# Patient Record
Sex: Female | Born: 1940 | Race: White | Hispanic: No | Smoking: Former smoker
Health system: Southern US, Community
[De-identification: ages and names within clinical notes are randomized; demographics above are authoritative.]

## PROBLEM LIST (undated history)

## (undated) DIAGNOSIS — F3289 Other specified depressive episodes: Secondary | ICD-10-CM

## (undated) DIAGNOSIS — I472 Ventricular tachycardia, unspecified: Secondary | ICD-10-CM

## (undated) DIAGNOSIS — E875 Hyperkalemia: Secondary | ICD-10-CM

## (undated) DIAGNOSIS — I4510 Unspecified right bundle-branch block: Secondary | ICD-10-CM

## (undated) DIAGNOSIS — Z8489 Family history of other specified conditions: Secondary | ICD-10-CM

## (undated) DIAGNOSIS — F329 Major depressive disorder, single episode, unspecified: Secondary | ICD-10-CM

## (undated) DIAGNOSIS — F411 Generalized anxiety disorder: Secondary | ICD-10-CM

## (undated) DIAGNOSIS — N183 Chronic kidney disease, stage 3 unspecified: Secondary | ICD-10-CM

## (undated) DIAGNOSIS — I2510 Atherosclerotic heart disease of native coronary artery without angina pectoris: Secondary | ICD-10-CM

## (undated) DIAGNOSIS — M1990 Unspecified osteoarthritis, unspecified site: Secondary | ICD-10-CM

## (undated) DIAGNOSIS — E854 Organ-limited amyloidosis: Secondary | ICD-10-CM

## (undated) DIAGNOSIS — I6523 Occlusion and stenosis of bilateral carotid arteries: Secondary | ICD-10-CM

## (undated) DIAGNOSIS — H348322 Tributary (branch) retinal vein occlusion, left eye, stable: Secondary | ICD-10-CM

## (undated) DIAGNOSIS — I429 Cardiomyopathy, unspecified: Secondary | ICD-10-CM

## (undated) DIAGNOSIS — N200 Calculus of kidney: Secondary | ICD-10-CM

## (undated) DIAGNOSIS — E739 Lactose intolerance, unspecified: Secondary | ICD-10-CM

## (undated) DIAGNOSIS — I959 Hypotension, unspecified: Secondary | ICD-10-CM

## (undated) DIAGNOSIS — E876 Hypokalemia: Secondary | ICD-10-CM

## (undated) DIAGNOSIS — I480 Paroxysmal atrial fibrillation: Secondary | ICD-10-CM

## (undated) DIAGNOSIS — I5040 Unspecified combined systolic (congestive) and diastolic (congestive) heart failure: Secondary | ICD-10-CM

## (undated) DIAGNOSIS — D649 Anemia, unspecified: Secondary | ICD-10-CM

## (undated) DIAGNOSIS — M4800 Spinal stenosis, site unspecified: Secondary | ICD-10-CM

## (undated) DIAGNOSIS — K219 Gastro-esophageal reflux disease without esophagitis: Secondary | ICD-10-CM

## (undated) DIAGNOSIS — G709 Myoneural disorder, unspecified: Secondary | ICD-10-CM

## (undated) DIAGNOSIS — E859 Amyloidosis, unspecified: Secondary | ICD-10-CM

## (undated) DIAGNOSIS — K589 Irritable bowel syndrome without diarrhea: Secondary | ICD-10-CM

## (undated) DIAGNOSIS — I43 Cardiomyopathy in diseases classified elsewhere: Secondary | ICD-10-CM

## (undated) DIAGNOSIS — I509 Heart failure, unspecified: Secondary | ICD-10-CM

## (undated) DIAGNOSIS — E785 Hyperlipidemia, unspecified: Secondary | ICD-10-CM

## (undated) HISTORY — PX: CARDIAC CATHETERIZATION: SHX172

## (undated) HISTORY — DX: Major depressive disorder, single episode, unspecified: F32.9

## (undated) HISTORY — PX: BREAST BIOPSY: SHX20

## (undated) HISTORY — DX: Calculus of kidney: N20.0

## (undated) HISTORY — DX: Hyperlipidemia, unspecified: E78.5

## (undated) HISTORY — DX: Irritable bowel syndrome, unspecified: K58.9

## (undated) HISTORY — PX: TONSILLECTOMY: SUR1361

## (undated) HISTORY — DX: Generalized anxiety disorder: F41.1

## (undated) HISTORY — DX: Amyloidosis, unspecified: E85.9

## (undated) HISTORY — DX: Other specified depressive episodes: F32.89

## (undated) HISTORY — DX: Spinal stenosis, site unspecified: M48.00

## (undated) HISTORY — DX: Tributary (branch) retinal vein occlusion, left eye, stable: H34.8322

## (undated) HISTORY — DX: Lactose intolerance, unspecified: E73.9

## (undated) HISTORY — DX: Atherosclerotic heart disease of native coronary artery without angina pectoris: I25.10

---

## 1959-08-25 HISTORY — PX: APPENDECTOMY: SHX54

## 1990-08-24 HISTORY — PX: BREAST BIOPSY: SHX20

## 1998-02-22 ENCOUNTER — Other Ambulatory Visit: Admission: RE | Admit: 1998-02-22 | Discharge: 1998-02-22 | Payer: Self-pay

## 2000-08-24 HISTORY — PX: EXCISIONAL HEMORRHOIDECTOMY: SHX1541

## 2002-04-05 ENCOUNTER — Encounter: Payer: Self-pay | Admitting: Internal Medicine

## 2002-05-19 ENCOUNTER — Encounter: Payer: Self-pay | Admitting: Internal Medicine

## 2003-08-25 HISTORY — PX: CARPAL TUNNEL RELEASE: SHX101

## 2005-04-10 ENCOUNTER — Encounter: Payer: Self-pay | Admitting: Internal Medicine

## 2005-10-16 ENCOUNTER — Ambulatory Visit: Payer: Self-pay | Admitting: Internal Medicine

## 2005-12-18 ENCOUNTER — Ambulatory Visit: Payer: Self-pay | Admitting: Internal Medicine

## 2006-05-31 ENCOUNTER — Ambulatory Visit: Payer: Self-pay | Admitting: Internal Medicine

## 2006-08-06 ENCOUNTER — Encounter: Admission: RE | Admit: 2006-08-06 | Discharge: 2006-11-04 | Payer: Self-pay | Admitting: Internal Medicine

## 2006-09-01 ENCOUNTER — Ambulatory Visit: Payer: Self-pay | Admitting: Internal Medicine

## 2006-10-05 ENCOUNTER — Encounter (INDEPENDENT_AMBULATORY_CARE_PROVIDER_SITE_OTHER): Payer: Self-pay | Admitting: Pulmonary Disease

## 2006-10-27 ENCOUNTER — Ambulatory Visit: Payer: Self-pay | Admitting: Internal Medicine

## 2006-10-28 ENCOUNTER — Ambulatory Visit: Payer: Self-pay | Admitting: Cardiology

## 2006-12-01 ENCOUNTER — Ambulatory Visit: Payer: Self-pay

## 2006-12-01 ENCOUNTER — Encounter: Payer: Self-pay | Admitting: Cardiology

## 2006-12-01 ENCOUNTER — Ambulatory Visit: Payer: Self-pay | Admitting: Cardiology

## 2007-01-06 ENCOUNTER — Encounter (INDEPENDENT_AMBULATORY_CARE_PROVIDER_SITE_OTHER): Payer: Self-pay | Admitting: Internal Medicine

## 2007-01-11 ENCOUNTER — Ambulatory Visit: Payer: Self-pay | Admitting: Cardiology

## 2007-01-18 ENCOUNTER — Encounter (HOSPITAL_COMMUNITY): Admission: RE | Admit: 2007-01-18 | Discharge: 2007-03-24 | Payer: Self-pay | Admitting: Cardiology

## 2007-02-21 ENCOUNTER — Ambulatory Visit (HOSPITAL_BASED_OUTPATIENT_CLINIC_OR_DEPARTMENT_OTHER): Admission: RE | Admit: 2007-02-21 | Discharge: 2007-02-21 | Payer: Self-pay | Admitting: Urology

## 2007-02-21 ENCOUNTER — Encounter (INDEPENDENT_AMBULATORY_CARE_PROVIDER_SITE_OTHER): Payer: Self-pay | Admitting: Urology

## 2007-02-21 HISTORY — PX: OTHER SURGICAL HISTORY: SHX169

## 2007-04-08 ENCOUNTER — Ambulatory Visit: Payer: Self-pay | Admitting: Cardiology

## 2007-08-02 ENCOUNTER — Ambulatory Visit: Payer: Self-pay | Admitting: Cardiology

## 2007-09-26 ENCOUNTER — Ambulatory Visit: Payer: Self-pay | Admitting: Cardiology

## 2007-12-21 ENCOUNTER — Ambulatory Visit: Payer: Self-pay | Admitting: Cardiology

## 2008-01-20 ENCOUNTER — Telehealth: Payer: Self-pay | Admitting: Internal Medicine

## 2008-02-16 ENCOUNTER — Ambulatory Visit: Payer: Self-pay | Admitting: Cardiology

## 2008-03-01 DIAGNOSIS — F411 Generalized anxiety disorder: Secondary | ICD-10-CM | POA: Insufficient documentation

## 2008-03-01 DIAGNOSIS — K589 Irritable bowel syndrome without diarrhea: Secondary | ICD-10-CM

## 2008-03-01 DIAGNOSIS — K648 Other hemorrhoids: Secondary | ICD-10-CM | POA: Insufficient documentation

## 2008-03-01 DIAGNOSIS — K5900 Constipation, unspecified: Secondary | ICD-10-CM | POA: Insufficient documentation

## 2008-03-01 DIAGNOSIS — I2510 Atherosclerotic heart disease of native coronary artery without angina pectoris: Secondary | ICD-10-CM | POA: Insufficient documentation

## 2008-03-01 DIAGNOSIS — E785 Hyperlipidemia, unspecified: Secondary | ICD-10-CM

## 2008-03-01 DIAGNOSIS — E739 Lactose intolerance, unspecified: Secondary | ICD-10-CM

## 2008-03-01 DIAGNOSIS — I10 Essential (primary) hypertension: Secondary | ICD-10-CM | POA: Insufficient documentation

## 2008-03-01 DIAGNOSIS — F329 Major depressive disorder, single episode, unspecified: Secondary | ICD-10-CM | POA: Insufficient documentation

## 2008-05-14 ENCOUNTER — Encounter: Payer: 59 | Admitting: Unknown Physician Specialty

## 2008-05-16 ENCOUNTER — Ambulatory Visit: Payer: Self-pay | Admitting: Cardiology

## 2008-05-24 ENCOUNTER — Encounter: Payer: 59 | Admitting: Unknown Physician Specialty

## 2008-06-07 ENCOUNTER — Encounter: Admission: RE | Admit: 2008-06-07 | Discharge: 2008-06-07 | Payer: Self-pay | Admitting: Neurology

## 2008-06-24 ENCOUNTER — Encounter: Payer: 59 | Admitting: Unknown Physician Specialty

## 2008-07-24 ENCOUNTER — Encounter: Payer: 59 | Admitting: Unknown Physician Specialty

## 2008-09-07 ENCOUNTER — Encounter: Payer: Self-pay | Admitting: Cardiology

## 2008-10-18 ENCOUNTER — Ambulatory Visit: Payer: Self-pay

## 2008-11-16 DIAGNOSIS — R609 Edema, unspecified: Secondary | ICD-10-CM

## 2008-11-19 ENCOUNTER — Encounter: Payer: Self-pay | Admitting: Cardiology

## 2008-11-19 ENCOUNTER — Ambulatory Visit: Payer: Self-pay | Admitting: Cardiology

## 2008-11-19 DIAGNOSIS — I491 Atrial premature depolarization: Secondary | ICD-10-CM

## 2008-11-20 ENCOUNTER — Emergency Department (HOSPITAL_COMMUNITY): Admission: EM | Admit: 2008-11-20 | Discharge: 2008-11-20 | Payer: Self-pay | Admitting: Emergency Medicine

## 2008-12-10 ENCOUNTER — Ambulatory Visit: Payer: Self-pay | Admitting: Cardiology

## 2008-12-10 ENCOUNTER — Encounter: Payer: Self-pay | Admitting: Cardiology

## 2008-12-10 ENCOUNTER — Ambulatory Visit: Payer: Self-pay

## 2008-12-10 LAB — CONVERTED CEMR LAB
ALT: 13 units/L (ref 0–35)
AST: 20 units/L (ref 0–37)
Albumin: 3.3 g/dL — ABNORMAL LOW (ref 3.5–5.2)
Alkaline Phosphatase: 31 units/L — ABNORMAL LOW (ref 39–117)
Bilirubin, Direct: 0.2 mg/dL (ref 0.0–0.3)
Cholesterol: 169 mg/dL (ref 0–200)
LDL Cholesterol: 93 mg/dL (ref 0–99)
Total Protein: 6.6 g/dL (ref 6.0–8.3)
Triglycerides: 53 mg/dL (ref 0.0–149.0)

## 2008-12-13 ENCOUNTER — Telehealth: Payer: Self-pay | Admitting: Cardiology

## 2008-12-21 ENCOUNTER — Telehealth: Payer: Self-pay | Admitting: Cardiology

## 2008-12-31 ENCOUNTER — Telehealth: Payer: Self-pay | Admitting: Cardiology

## 2009-01-04 ENCOUNTER — Telehealth: Payer: Self-pay | Admitting: Cardiology

## 2009-01-14 ENCOUNTER — Telehealth: Payer: Self-pay | Admitting: Internal Medicine

## 2009-01-16 ENCOUNTER — Ambulatory Visit: Payer: Self-pay | Admitting: Internal Medicine

## 2009-01-16 DIAGNOSIS — R141 Gas pain: Secondary | ICD-10-CM

## 2009-01-16 DIAGNOSIS — R142 Eructation: Secondary | ICD-10-CM

## 2009-01-16 DIAGNOSIS — R1013 Epigastric pain: Secondary | ICD-10-CM

## 2009-01-16 DIAGNOSIS — R143 Flatulence: Secondary | ICD-10-CM

## 2009-01-23 ENCOUNTER — Ambulatory Visit (HOSPITAL_COMMUNITY): Admission: RE | Admit: 2009-01-23 | Discharge: 2009-01-23 | Payer: Self-pay | Admitting: Internal Medicine

## 2009-01-30 ENCOUNTER — Ambulatory Visit: Payer: Self-pay

## 2009-01-30 ENCOUNTER — Ambulatory Visit: Payer: Self-pay | Admitting: Cardiology

## 2009-03-04 ENCOUNTER — Ambulatory Visit: Payer: Self-pay | Admitting: Cardiology

## 2009-03-06 ENCOUNTER — Telehealth: Payer: Self-pay | Admitting: Cardiology

## 2009-03-12 ENCOUNTER — Ambulatory Visit: Payer: Self-pay | Admitting: Internal Medicine

## 2009-05-08 ENCOUNTER — Ambulatory Visit: Payer: Self-pay | Admitting: Cardiology

## 2009-05-22 ENCOUNTER — Encounter: Payer: Self-pay | Admitting: Cardiology

## 2009-05-27 ENCOUNTER — Telehealth: Payer: Self-pay | Admitting: Cardiology

## 2009-06-06 ENCOUNTER — Encounter: Payer: Self-pay | Admitting: Cardiology

## 2009-06-24 ENCOUNTER — Ambulatory Visit: Payer: Self-pay | Admitting: Cardiology

## 2009-07-26 ENCOUNTER — Telehealth: Payer: Self-pay | Admitting: Cardiology

## 2009-08-12 ENCOUNTER — Telehealth (INDEPENDENT_AMBULATORY_CARE_PROVIDER_SITE_OTHER): Payer: Self-pay | Admitting: *Deleted

## 2009-08-12 ENCOUNTER — Telehealth: Payer: Self-pay | Admitting: Cardiology

## 2009-08-19 ENCOUNTER — Encounter: Payer: Self-pay | Admitting: Cardiology

## 2009-09-12 ENCOUNTER — Telehealth: Payer: Self-pay | Admitting: Cardiology

## 2009-09-13 ENCOUNTER — Encounter: Payer: Self-pay | Admitting: Cardiology

## 2009-09-25 ENCOUNTER — Encounter (INDEPENDENT_AMBULATORY_CARE_PROVIDER_SITE_OTHER): Payer: Self-pay | Admitting: Neurosurgery

## 2009-09-25 ENCOUNTER — Ambulatory Visit (HOSPITAL_COMMUNITY): Admission: RE | Admit: 2009-09-25 | Discharge: 2009-09-25 | Payer: Self-pay | Admitting: Neurosurgery

## 2009-09-25 HISTORY — PX: OTHER SURGICAL HISTORY: SHX169

## 2009-10-02 ENCOUNTER — Encounter: Payer: Self-pay | Admitting: Cardiology

## 2009-10-16 ENCOUNTER — Encounter: Payer: Self-pay | Admitting: Cardiology

## 2009-10-16 DIAGNOSIS — I6529 Occlusion and stenosis of unspecified carotid artery: Secondary | ICD-10-CM

## 2009-10-17 ENCOUNTER — Telehealth (INDEPENDENT_AMBULATORY_CARE_PROVIDER_SITE_OTHER): Payer: Self-pay | Admitting: *Deleted

## 2009-10-17 ENCOUNTER — Encounter: Payer: Self-pay | Admitting: Cardiology

## 2009-10-17 ENCOUNTER — Ambulatory Visit: Payer: Self-pay

## 2009-10-22 ENCOUNTER — Encounter: Payer: Self-pay | Admitting: Cardiology

## 2009-10-24 ENCOUNTER — Telehealth: Payer: Self-pay | Admitting: Internal Medicine

## 2009-11-08 ENCOUNTER — Telehealth: Payer: Self-pay | Admitting: Internal Medicine

## 2009-11-14 ENCOUNTER — Ambulatory Visit: Payer: Self-pay | Admitting: Internal Medicine

## 2009-11-18 ENCOUNTER — Encounter: Payer: Self-pay | Admitting: Cardiology

## 2009-12-18 ENCOUNTER — Telehealth: Payer: Self-pay | Admitting: Internal Medicine

## 2009-12-24 ENCOUNTER — Ambulatory Visit: Payer: Self-pay | Admitting: Cardiology

## 2009-12-24 DIAGNOSIS — R5383 Other fatigue: Secondary | ICD-10-CM

## 2009-12-24 DIAGNOSIS — R5381 Other malaise: Secondary | ICD-10-CM

## 2009-12-26 ENCOUNTER — Telehealth: Payer: Self-pay | Admitting: Internal Medicine

## 2010-01-13 ENCOUNTER — Encounter: Payer: Self-pay | Admitting: Cardiology

## 2010-01-13 ENCOUNTER — Ambulatory Visit: Payer: Self-pay

## 2010-01-13 ENCOUNTER — Ambulatory Visit: Payer: Self-pay | Admitting: Cardiology

## 2010-01-13 ENCOUNTER — Ambulatory Visit (HOSPITAL_COMMUNITY): Admission: RE | Admit: 2010-01-13 | Discharge: 2010-01-13 | Payer: Self-pay | Admitting: Cardiology

## 2010-01-13 ENCOUNTER — Encounter (INDEPENDENT_AMBULATORY_CARE_PROVIDER_SITE_OTHER): Payer: Self-pay | Admitting: *Deleted

## 2010-01-24 ENCOUNTER — Telehealth: Payer: Self-pay | Admitting: Internal Medicine

## 2010-01-28 ENCOUNTER — Ambulatory Visit: Payer: Self-pay | Admitting: Cardiology

## 2010-01-28 ENCOUNTER — Encounter: Payer: Self-pay | Admitting: Internal Medicine

## 2010-02-04 ENCOUNTER — Encounter: Payer: Self-pay | Admitting: Cardiology

## 2010-02-07 ENCOUNTER — Telehealth: Payer: Self-pay | Admitting: Internal Medicine

## 2010-02-11 ENCOUNTER — Ambulatory Visit: Payer: Self-pay | Admitting: Internal Medicine

## 2010-02-12 LAB — CONVERTED CEMR LAB
Basophils Absolute: 0 10*3/uL (ref 0.0–0.1)
Eosinophils Absolute: 0.1 10*3/uL (ref 0.0–0.7)
Lymphocytes Relative: 33.6 % (ref 12.0–46.0)
MCHC: 33.8 g/dL (ref 30.0–36.0)
Monocytes Relative: 5.7 % (ref 3.0–12.0)
Neutro Abs: 4.1 10*3/uL (ref 1.4–7.7)
Neutrophils Relative %: 59.7 % (ref 43.0–77.0)
Platelets: 245 10*3/uL (ref 150.0–400.0)
RDW: 17.7 % — ABNORMAL HIGH (ref 11.5–14.6)
Saturation Ratios: 6.9 % — ABNORMAL LOW (ref 20.0–50.0)
Transferrin: 260.1 mg/dL (ref 212.0–360.0)
Vitamin B-12: 493 pg/mL (ref 211–911)

## 2010-02-20 ENCOUNTER — Ambulatory Visit: Payer: Self-pay | Admitting: Internal Medicine

## 2010-02-21 LAB — CONVERTED CEMR LAB
Fecal Occult Blood: NEGATIVE
OCCULT 2: NEGATIVE
OCCULT 5: NEGATIVE

## 2010-03-04 ENCOUNTER — Telehealth: Payer: Self-pay | Admitting: Cardiology

## 2010-03-10 ENCOUNTER — Telehealth: Payer: Self-pay | Admitting: Cardiology

## 2010-03-27 ENCOUNTER — Encounter: Payer: Self-pay | Admitting: Cardiology

## 2010-04-04 ENCOUNTER — Ambulatory Visit: Payer: Self-pay | Admitting: Cardiology

## 2010-04-07 ENCOUNTER — Telehealth: Payer: Self-pay | Admitting: Cardiology

## 2010-04-10 ENCOUNTER — Telehealth: Payer: Self-pay | Admitting: Internal Medicine

## 2010-04-11 ENCOUNTER — Telehealth: Payer: Self-pay | Admitting: Internal Medicine

## 2010-04-22 ENCOUNTER — Encounter: Payer: Self-pay | Admitting: Cardiology

## 2010-04-25 ENCOUNTER — Telehealth: Payer: Self-pay | Admitting: Cardiology

## 2010-05-02 ENCOUNTER — Ambulatory Visit: Payer: Self-pay | Admitting: Cardiology

## 2010-05-02 ENCOUNTER — Telehealth: Payer: Self-pay | Admitting: Cardiology

## 2010-05-02 DIAGNOSIS — I498 Other specified cardiac arrhythmias: Secondary | ICD-10-CM | POA: Insufficient documentation

## 2010-05-07 ENCOUNTER — Encounter: Payer: Self-pay | Admitting: Internal Medicine

## 2010-05-08 ENCOUNTER — Telehealth: Payer: Self-pay | Admitting: Cardiology

## 2010-05-12 ENCOUNTER — Ambulatory Visit: Payer: Self-pay | Admitting: Cardiovascular Disease

## 2010-05-12 LAB — CONVERTED CEMR LAB
INR: 1.2
POC INR: 1.2

## 2010-05-16 ENCOUNTER — Ambulatory Visit: Payer: Self-pay | Admitting: Cardiology

## 2010-05-16 LAB — CONVERTED CEMR LAB: POC INR: 1.8

## 2010-05-21 ENCOUNTER — Ambulatory Visit: Payer: Self-pay | Admitting: Cardiology

## 2010-05-30 ENCOUNTER — Ambulatory Visit: Payer: Self-pay | Admitting: Cardiology

## 2010-05-30 LAB — CONVERTED CEMR LAB: POC INR: 3.9

## 2010-06-05 ENCOUNTER — Telehealth: Payer: Self-pay | Admitting: Cardiology

## 2010-06-05 ENCOUNTER — Ambulatory Visit: Payer: Self-pay | Admitting: Cardiology

## 2010-06-09 ENCOUNTER — Telehealth: Payer: Self-pay | Admitting: Cardiology

## 2010-06-09 ENCOUNTER — Ambulatory Visit: Payer: Self-pay | Admitting: Cardiology

## 2010-06-10 LAB — CONVERTED CEMR LAB
BUN: 19 mg/dL (ref 6–23)
CO2: 32 meq/L (ref 19–32)
Calcium: 8.6 mg/dL (ref 8.4–10.5)
Chloride: 101 meq/L (ref 96–112)
Creatinine, Ser: 0.6 mg/dL (ref 0.4–1.2)

## 2010-06-13 ENCOUNTER — Telehealth: Payer: Self-pay | Admitting: Cardiology

## 2010-06-19 ENCOUNTER — Encounter: Admission: RE | Admit: 2010-06-19 | Discharge: 2010-06-19 | Payer: Self-pay

## 2010-06-23 ENCOUNTER — Ambulatory Visit: Payer: Self-pay | Admitting: Cardiology

## 2010-07-04 ENCOUNTER — Encounter: Payer: Self-pay | Admitting: Cardiology

## 2010-07-07 ENCOUNTER — Ambulatory Visit: Payer: Self-pay | Admitting: Internal Medicine

## 2010-07-07 ENCOUNTER — Telehealth: Payer: Self-pay | Admitting: Cardiology

## 2010-07-22 ENCOUNTER — Telehealth: Payer: Self-pay | Admitting: Cardiology

## 2010-07-26 ENCOUNTER — Telehealth: Payer: Self-pay | Admitting: Physician Assistant

## 2010-07-29 ENCOUNTER — Telehealth: Payer: Self-pay | Admitting: Cardiology

## 2010-07-29 ENCOUNTER — Encounter: Payer: Self-pay | Admitting: Cardiology

## 2010-07-30 ENCOUNTER — Encounter: Payer: Self-pay | Admitting: Cardiology

## 2010-08-08 ENCOUNTER — Ambulatory Visit: Payer: Self-pay | Admitting: Cardiology

## 2010-08-12 ENCOUNTER — Encounter: Payer: Self-pay | Admitting: Cardiology

## 2010-08-12 LAB — CONVERTED CEMR LAB
POC INR: 2.73
Prothrombin Time: 29.8 s

## 2010-09-01 ENCOUNTER — Telehealth: Payer: Self-pay | Admitting: Cardiology

## 2010-09-02 ENCOUNTER — Other Ambulatory Visit: Payer: Self-pay

## 2010-09-02 ENCOUNTER — Ambulatory Visit: Admission: RE | Admit: 2010-09-02 | Discharge: 2010-09-02 | Payer: Self-pay | Source: Home / Self Care

## 2010-09-02 LAB — BASIC METABOLIC PANEL
BUN: 26 mg/dL — ABNORMAL HIGH (ref 6–23)
CO2: 31 mEq/L (ref 19–32)
Calcium: 8.6 mg/dL (ref 8.4–10.5)
Chloride: 104 mEq/L (ref 96–112)
Creatinine, Ser: 0.9 mg/dL (ref 0.4–1.2)
GFR: 67.32 mL/min (ref >60.00)
Glucose, Bld: 80 mg/dL (ref 70–99)
Potassium: 4.3 mEq/L (ref 3.5–5.1)
Sodium: 142 mEq/L (ref 135–145)

## 2010-09-02 LAB — CBC WITH DIFFERENTIAL/PLATELET
Basophils Absolute: 0 10*3/uL (ref 0.0–0.1)
Basophils Relative: 0.3 % (ref 0.0–3.0)
Eosinophils Absolute: 0 10*3/uL (ref 0.0–0.7)
Eosinophils Relative: 0.7 % (ref 0.0–5.0)
HCT: 31.3 % — ABNORMAL LOW (ref 36.0–46.0)
Hemoglobin: 10.4 g/dL — ABNORMAL LOW (ref 12.0–15.0)
Lymphocytes Relative: 35.4 % (ref 12.0–46.0)
Lymphs Abs: 2.3 10*3/uL (ref 0.7–4.0)
MCHC: 33.2 g/dL (ref 30.0–36.0)
MCV: 89.6 fl (ref 78.0–100.0)
Monocytes Absolute: 0.3 10*3/uL (ref 0.1–1.0)
Monocytes Relative: 4.7 % (ref 3.0–12.0)
Neutro Abs: 3.9 10*3/uL (ref 1.4–7.7)
Neutrophils Relative %: 58.9 % (ref 43.0–77.0)
Platelets: 311 10*3/uL (ref 150.0–400.0)
RBC: 3.49 Mil/uL — ABNORMAL LOW (ref 3.87–5.11)
RDW: 15.2 % — ABNORMAL HIGH (ref 11.5–14.6)
WBC: 6.6 10*3/uL (ref 4.5–10.5)

## 2010-09-08 ENCOUNTER — Encounter (INDEPENDENT_AMBULATORY_CARE_PROVIDER_SITE_OTHER): Payer: Self-pay | Admitting: *Deleted

## 2010-09-21 LAB — CONVERTED CEMR LAB
Calcium: 8.9 mg/dL (ref 8.4–10.5)
Chloride: 103 meq/L (ref 96–112)
Creatinine, Ser: 0.9 mg/dL (ref 0.4–1.2)
Sodium: 142 meq/L (ref 135–145)

## 2010-09-23 ENCOUNTER — Ambulatory Visit: Admission: RE | Admit: 2010-09-23 | Discharge: 2010-09-23 | Payer: Self-pay | Source: Home / Self Care

## 2010-09-23 LAB — CONVERTED CEMR LAB: POC INR: 2.9

## 2010-09-23 NOTE — Progress Notes (Signed)
Summary: re starting med  RX for Dilunisal  Phone Note Call from Patient   Caller: Patient [phone] Reason for Call: Talk to Nurse Summary of Call: pt calling re starting her diflunisal if she can -will need rx [facility] Initial call taken by: Glynda Jaeger,  June 13, 2010 10:45 AM  Follow-up for Phone Call        Diflunisal 250 mg by mouth bid Follow-up by: Rollene Rotunda, MD, [facility],  June 13, 2010 2:05 PM    New/Updated Medications: DIFLUNISAL 500 MG TABS (DIFLUNISAL) 1/2 tablet twice a day Prescriptions: DIFLUNISAL 500 MG TABS (DIFLUNISAL) 1/2 tablet twice a day  #30 x 3   Entered by:   Charolotte Capuchin, RN   Authorized by:   Rollene Rotunda, MD, [facility]   Signed by:   Charolotte Capuchin, RN on 06/13/2010   Method used:   Electronically to        [id number]* (retail)       [address]       [place], [place]  [address]       Ph: [phone]       Fax: [phone]   RxID:   [id number]   Appended Document: re starting med  RX for Dilunisal pt may take either 250 mg one twice a day or 500 mg once a day, vo. Dr Rollene Rotunda.  Pharmacy aware.

## 2010-09-23 NOTE — Letter (Signed)
Summary: [facility]   [facility] Medicine Clinic Note   Imported By: Roderic Ovens 05/23/2010 16:07:35  _____________________________________________________________________  External Attachment:    Type:   Image     Comment:   External Document

## 2010-09-23 NOTE — Progress Notes (Signed)
  Walk in Patient Form Recieved " Pt. left BP readings" forwarded to Pam/Hochrein [facility]  October 17, 2009 3:53 PM

## 2010-09-23 NOTE — Progress Notes (Signed)
Summary: test results  Phone Note Call from Patient Call back at [phone]   Caller: Patient Reason for Call: Lab or Test Results Summary of Call: Test results Initial call taken by: Judie Grieve,  July 29, 2010 3:10 PM  Follow-up for Phone Call        [facility] Katina Dung, RN, BSN  July 29, 2010 3:23 PM --I talked pt --pt had a CBC and BMP in addition to a PT --they were to be faxed to [facility] fax--the reports have not been received in [facility] yet--[facility] will forward CBC and BMP reports to Dr Antoine Poche for his review when they have been received Katina Dung, RN, BSN  July 29, 2010 3:29 PM

## 2010-09-23 NOTE — Progress Notes (Signed)
Summary: copy of test result   Phone Note Call from Patient Call back at Home Phone [phone]   Caller: Patient Reason for Call: Talk to Nurse, Lab or Test Results Details for Reason: pls mail copy of test results to pt . pt aware Pam is off today / will be in the office on tomorrow.  Initial call taken by: Lorne Skeens,  April 07, 2010 8:29 AM  Follow-up for Phone Call        Spoke with patient...she asked for copies of carotid ultrasound, echo and office note from May to be mailed to her home to bring to another clinic appointment. Mailed copies of all documents. Follow-up by: Suzan Garibaldi RN

## 2010-09-23 NOTE — Progress Notes (Signed)
Summary: B/P low 72/38  Phone Note Call from Patient Call back at Home Phone [phone]   Caller: Patient Summary of Call: Pt calling about b/p being low 72/38 took in left arm Initial call taken by: Judie Grieve,  May 02, 2010 2:54 PM  Follow-up for Phone Call        rechecked  BP 90/39 now, she denies s/s.  states she has not taken any furosemide today.  Instructed pt to continue to hold Furosemide as long as BP is that low, also to increase water intake as her BUN was 25 and she is down 22 lbs in 2 weeks.  If she should notice any evidence of holding onto fluid and BP is still low she is to call the MD on call this weekend.  Pt is agreeable Follow-up by: Charolotte Capuchin, RN,  May 02, 2010 3:20 PM

## 2010-09-23 NOTE — Progress Notes (Signed)
Summary: pt needs letter faxed for biopsy  done  Phone Note Call from Patient Call back at Home Phone [phone]   Caller: Patient Reason for Call: Talk to Nurse, Talk to Doctor Summary of Call: pt has a biopsy scheduled on feb 2nd with Dr. Bettina Gavia and she needs a letter saying she can have general anesthesia faxed to [phone]  Initial call taken by: Omer Jack,  September 12, 2009 1:00 PM  Follow-up for Phone Call        Pt. to have biopsy of nerve and muscle in leg and foot to see if neuropathy can be treated. Biopsy to be done by Dr. Newell Coral and note needed as above.  Pt made aware that Dr. Antoine Poche is not in office today and that I would forward to [facility] to follow up when back in office tomorrow. Follow-up by: Dossie Arbour, RN, BSN,  September 12, 2009 1:16 PM  Additional Follow-up for Phone Call Additional follow up Details #1::        Per Dr Earl Gala office  they do not need a letter only the surgical clearance form signed.  Dr Antoine Poche aware and form to be signed and faxed today. Form signed and faxed.  attempted to call pt to let her know however phone is busy X 5 Additional Follow-up by: Charolotte Capuchin, RN,  September 13, 2009 11:59 AM

## 2010-09-23 NOTE — Progress Notes (Signed)
Summary: wants order for bloodwork today  Phone Note Call from Patient   Caller: Patient [phone] Reason for Call: Talk to Nurse Summary of Call: pt had bloodwork done at pcp, her potassium was high and they wanted it repeated by the end of the week, pt calling to see if she can get an order to come in today Initial call taken by: Glynda Jaeger,  June 05, 2010 9:04 AM  Follow-up for Phone Call        pt PCP request K+ to be checked. last was 5.5. pls send results to  to Dr. Otis Dials. She will come in at 3 today. Follow-up by: Claris Gladden RN,  June 05, 2010 9:17 AM

## 2010-09-23 NOTE — Letter (Signed)
Summary: [facility]  [facility]   Imported By: Marylou Mccoy 05/23/2010 15:33:54  _____________________________________________________________________  External Attachment:    Type:   Image     Comment:   External Document

## 2010-09-23 NOTE — Progress Notes (Signed)
Summary: wants to know about AT Fib and need for treatment  Phone Note Call from Patient Call back at Home Phone [phone]   Caller: Patient Reason for Call: Talk to Nurse Summary of Call: pt wants to know if there is anymore information regarding [facility]  Initial call taken by: Edman Circle,  May 08, 2010 9:57 AM  Follow-up for Phone Call        per Dr Fayrene Fearing Hochrein/ pt to start 2.5 mg Coumadin daily and have Pt/INR checked Monday in the Coumadin Clinic.  She is aware and agreeable  Follow-up by: Charolotte Capuchin, RN,  May 09, 2010 9:45 AM  Additional Follow-up for Phone Call Additional follow up Details #1::        Discussed at lenght with the patient yesterday.  I have reviewd the records and EKG from [place].  She did have atrial fibrillation.  I think the benefit of coumadin outweighs the risk.  We reviewed this at length.  We will call ans start the coumadin as directed above. Additional Follow-up by: Rollene Rotunda, MD, [facility],  May 09, 2010 2:07 PM    New/Updated Medications: WARFARIN SODIUM 2.5 MG TABS (WARFARIN SODIUM) one every evening or as directed Prescriptions: WARFARIN SODIUM 2.5 MG TABS (WARFARIN SODIUM) one every evening or as directed  #30 x 3   Entered by:   Charolotte Capuchin, RN   Authorized by:   Rollene Rotunda, MD, [facility]   Signed by:   Charolotte Capuchin, RN on 05/09/2010   Method used:   Electronically to        [facility] (retail)       [address]       [place], [place]  [address]       Ph: [phone] or [phone]       Fax: [phone]   RxID:   [id number]

## 2010-09-23 NOTE — Progress Notes (Signed)
Summary: refill  Phone Note Refill Request Message from:  Patient on July 22, 2010 10:07 AM  Refills Requested: Medication #1:  FUROSEMIDE 20 MG TABS 3 by mouth daily  Medication #2:  POTASSIUM CHLORIDE CRYS CR 10 MEQ CR-TABS 3 by mouth daily Pt need a increase in the pills [facility] [phone]  Initial call taken by: Judie Grieve,  July 22, 2010 10:09 AM  Follow-up for Phone Call        [facility] for pt that RX was sent into pharmacy. Marrion Coy, CNA  July 23, 2010 9:45 AM  Follow-up by: Marrion Coy, CNA,  July 23, 2010 9:45 AM    New/Updated Medications: FUROSEMIDE 20 MG TABS (FUROSEMIDE) 3 by mouth daily POTASSIUM CHLORIDE CRYS CR 10 MEQ CR-TABS (POTASSIUM CHLORIDE CRYS CR) 3 by mouth daily Prescriptions: POTASSIUM CHLORIDE CRYS CR 10 MEQ CR-TABS (POTASSIUM CHLORIDE CRYS CR) 3 by mouth daily  #90 x 8   Entered by:   Marrion Coy, CNA   Authorized by:   Rollene Rotunda, MD, [facility]   Signed by:   Marrion Coy, CNA on 07/23/2010   Method used:   Electronically to        [facility]* (retail)       [address]       [place], [place]  [address]       Ph: [phone]       Fax: [phone]   RxID:   [id number] FUROSEMIDE 20 MG TABS (FUROSEMIDE) 3 by mouth daily  #90 x 6   Entered by:   Marrion Coy, CNA   Authorized by:   Rollene Rotunda, MD, [facility]   Signed by:   Marrion Coy, CNA on 07/23/2010   Method used:   Electronically to        [facility]* (retail)       [address]       [place], [place]  [address]       Ph: [phone]       Fax: [phone]   RxID:   [id number]

## 2010-09-23 NOTE — Assessment & Plan Note (Signed)
Summary: ANEMIA      (PT. TO BRING LABS WITH HER)        Miranda [name]   History of Present Illness Visit Type: Follow-up Visit Primary GI MD: Lina Sar MD Primary Provider: Hamilton Capri, MD Chief Complaint: pt states she is having severe constipation, pt has taken 4 teaspoons MOM, prune juice and butter without relief History of Present Illness:   This is a 70 year old white female who has irritable bowel syndrome with predominant constipation. She has increased gastroesophageal reflux which initially responded to increasing her Zegerid to twice a day and in addition, Carafate slurry 10 cc twice a day. Her main complaints today consist of severe constipation and reflux. She was also recently found to be anemic with a hemoglobin of 11.3 and hematocrit of 35.8. Her past history includes a normal colonoscopy in August 2003  at [facility] for a tortuous colon. She had a normal upper endoscopy in September 2003. An anorectal manometry in August 2006 showed weak internal anal pressure and weak squeeze. She has lactose intolerance and a positive hydrogen breath test for bacterial overgrowth. An upper abdominal ultrasound in June 2010 showed a prominent common bile duct of 6.6 mm and mild intrahepatic prominence. She is due for an upper and lower endoscopy.She saw her Cardiologist, Dr.Hochrein recently and says he was okay with her having her Endo/Colon, but she would like to wait until she completes her cardiology work-up in [place] at [facility].   GI Review of Systems    Reports acid reflux and  belching.      Denies abdominal pain, bloating, chest pain, dysphagia with liquids, dysphagia with solids, heartburn, loss of appetite, nausea, vomiting, vomiting blood, weight loss, and  weight gain.      Reports constipation.     Denies anal fissure, black tarry stools, change in bowel habit, diarrhea, diverticulosis, fecal incontinence, heme positive stool, hemorrhoids, irritable  bowel syndrome, jaundice, light color stool, liver problems, rectal bleeding, and  rectal pain.    Current Medications (verified): 1)  Align   Caps (Misc Intestinal Flora Regulat) .... Take 1 Tablet By Mouth Once A Day 2)  Ramipril 2.5 Mg  Caps (Ramipril) .... Take 1 Tab By Mouth Two Times A Day As Needed 3)  Aspirin 81 Mg  Tbec (Aspirin) .... Take 1 Tablet By Mouth Once A Day 4)  Gabapentin 100 Mg  Caps (Gabapentin) .... Take 1-3 As Needed Per Day 5)  Ca With Vitamin D .... One By Mouth Daily 6)  Furosemide 20 Mg Tabs (Furosemide) .... Take 1 Tablet By Mouth Once A Day As Needed 7)  Potassium Chloride Crys Cr 10 Meq Cr-Tabs (Potassium Chloride Crys Cr) .... Take 1 Tablet By Mouth Once A Day 8)  Cyanocobalamin 1000 Mcg/ml Soln (Cyanocobalamin) .... One Injection Im Once Monthly 9)  Sucralfate 1 Gm/90ml Susp (Sucralfate) .... Take 10cc By Mouth 4 Times Daily. 10)  Evista 60 Mg Tabs (Raloxifene Hcl) .Marland Kitchen.. 1 By Mouth Once Daily 11)  Prilosec 40 Mg  Cpdr (Omeprazole) .Marland Kitchen.. 1 Twice A Day 30 Minutes Before Meals 12)  Carvedilol 3.125 Mg Tabs (Carvedilol) .... One Twice A Day 13)  Cipro 500 Mg Tabs (Ciprofloxacin Hcl) .... Take 1 Tablet By Mouth Two Times A Day For 5 Days  Allergies (verified): 1)  ! * Statins  Past History:  Past Medical History: Reviewed history from 01/28/2010 and no changes required. SYNCOPE (ICD-780.2) EDEMA (ICD-782.3) DIZZINESS (ICD-780.4) HEMORRHOIDS, INTERNAL (ICD-455.0) LACTOSE INTOLERANCE (ICD-271.3) DEPRESSION (ICD-311)  ANXIETY (ICD-300.00) IRRITABLE BOWEL SYNDROME (ICD-564.1) DYSLIPIDEMIA (ICD-272.4) HYPERTENSION (ICD-401.9) CORONARY ARTERY DISEASE (left main 20% stenosis, the LAD luminal irregularity 40%   stenosis, the ramus intermediate 95% ostial stenosis.  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 Appendectomy Hemorrhoidectomy Carpal Tunnel release Cystoscopy and cold cup bladder biopsy and fulguration Nerve and muscle biopsy  Family History: Reviewed history from 11/16/2008 and no changes required. No FH of Colon Cancer: Family History of Heart Disease: Mother  Noncontributory for early coronary disease.  Both her  parents died at later ages of heart failure.  Social History: Reviewed history from 11/16/2008 and no changes required. Occupation: Division of Copy Alcohol Use - yes-infrequently Patient is a former smoker. -stopped 20 + years ago Retired  Divorced   Review of Systems  The patient denies allergy/sinus, anemia, anxiety-new, arthritis/joint pain, back pain, blood in urine, breast changes/lumps, confusion, cough, coughing up blood, depression-new, fainting, fatigue, fever, headaches-new, hearing problems, heart murmur, heart rhythm changes, itching, menstrual pain, muscle pains/cramps, night sweats, nosebleeds, pregnancy symptoms, shortness of breath, skin rash, sleeping problems, sore throat, swelling of feet/legs, swollen lymph glands, thirst - excessive, urination - excessive, urination changes/pain, urine leakage, vision changes, and voice change.         Pertinent positive and negative review of systems were noted in the above HPI. All other ROS was otherwise negative.   Vital Signs:  Patient profile:   70 year old female Height:      64 inches Weight:      135 pounds BMI:     23.26 BSA:     1.66 Pulse rate:   76 / minute Pulse rhythm:   regular BP sitting:   118 / 68  (left arm) Cuff size:   regular  Vitals Entered By: Francee Piccolo CMA Duncan Dull) (February 11, 2010 9:09 AM)  Physical Exam  General:  patient ambulates with a walker. She is alert and oriented. Eyes:  nonicteric. Neck:  Supple; no masses or thyromegaly. Lungs:  Clear throughout to auscultation. Heart:  Regular rate and  rhythm; no murmurs, rubs,  or bruits. Abdomen:  soft abdomen with decreased muscle tone and palpable loops of the bowel with mass due to stool impaction in left lower quadrant. No tenderness. Liver edge at costal margin. Increased tympany in the periumbilical area. Rectal:  decreased rectal sphincter tone. Strongly Hemoccult-positive stool. Large amount of soft stool in the rectum. Extremities:  2+ edema bilaterally. Skin:  Intact without significant lesions or rashes. Psych:  Alert and cooperative. Normal mood and affect.   Impression & Recommendations:  Problem # 1:  HEMOCCULT POSITIVE STOOL (ICD-578.1) Patient has strongly Hemoccult positive stool in the setting off anemia. Her hemoglobin is 11.3. A hemoglobin in 2008 was 13.6 and later 12.7. The etiology is not clear. She is due for colonoscopy as well as upper endoscopy. I will again check with Dr. Antoine Poche since patient tells me that Dr. Antoine Poche  wants to wait. It seems we have conflicting information here. We will check her CEA level today, CBC, iron studies and B12. We will also give her Hemoccult cards. We need to rule out ongoing blood loss. Orders: [facility] Hemoccult Cards #3 (take home) (Hem cards #3) TLB-CBC Platelet - w/Differential (85025-CBCD) TLB-B12, Serum-Total ONLY (82607-B12) TLB-IBC Pnl (Iron/FE;Transferrin) (83550-IBC) TLB-CEA (Carcinoembryonic Antigen) (82378-CEA)  Problem #  2:  IRRITABLE BOWEL SYNDROME (ICD-564.1) Patient has irritable bowel syndrome diagnosed on several prior occasions and treated with antispasmodics and probiotics. Orders: TLB-CBC Platelet - w/Differential (85025-CBCD) TLB-B12, Serum-Total ONLY (82607-B12) TLB-IBC Pnl (Iron/FE;Transferrin) (83550-IBC) TLB-CEA (Carcinoembryonic Antigen) (82378-CEA)  Problem # 3:  EPIGASTRIC PAIN (ICD-789.06) Patient has ongoing gastroesophageal reflux currently controlled with Carafate 10 cc p.o. q.i.d. and omeprazole 40 mg twice a day. She may taper off of  her Carafate and continue on omeprazole.  Patient Instructions: 1)  omeprazole 40 mg p.o. b.i.d. 2)  Reduce Carafate 10 cc p.o. p.o. b.i.d. for 3 days then discontinue. 3)  Hemoccult cards. 4)  Check with Dr.Hochrein about possibility of colonoscopy. 5)  CBC, iron studies and B12 levels as well as CEA level today. 6)  If iron deficient, we will start iron supplements. 7)  Copy sent to : Dr Kathie Rhodes. [place], Dr Shela Commons.Hochrein Prescriptions: MIRALAX  POWD (POLYETHYLENE GLYCOL 3350) Dissolve 17 frmas (1 capful) in at least 8 ounces water/juice and drink once daily  #527 grams x 3   Entered by:   Lamona Curl CMA (AAMA)   Authorized by:   Hart Carwin MD   Signed by:   Lamona Curl CMA (AAMA) on 02/11/2010   Method used:   Electronically to        [facility] (retail)       [address]       [place], [place]  [address]       Ph: [phone] or [phone]       Fax: [phone]   RxID:   [id number]

## 2010-09-23 NOTE — Progress Notes (Signed)
Summary: pt has questions re visit to [facility] Note Call from Patient   Caller: Patient [phone] Reason for Call: Talk to Nurse Summary of Call: pt wants to see if dr Ardis Fullwood could call her re her visit to [facility]-also had other questions-can be reached this am until 1p or anytime tomorrow [phone] Initial call taken by: Glynda Jaeger,  March 04, 2010 9:11 AM  Follow-up for Phone Call        Wants to bring you up to date and has a few questions as well.  Sander Nephew, RN  Additional Follow-up for Phone Call Additional follow up Details #1::        I have tried to call several times. Additional Follow-up by: Rollene Rotunda, MD, [facility],  March 10, 2010 1:18 PM

## 2010-09-23 NOTE — Progress Notes (Signed)
Summary: TRIAGE-WORSENING REFLUX  Phone Note Call from Patient Call back at Home Phone [phone]   Call For: Dr Juanda Chance Reason for Call: Talk to Nurse Summary of Call: Is still having problems after using the medication recommended 2wks ago. What can she try next? Initial call taken by: Leanor Kail [facility],  November 08, 2009 1:58 PM  Follow-up for Phone Call        (Last OV 03-12-09.)  Pt. c/o several weeks of reflux, she tried Zegerid for 2 weeks. States it helped, she was free of reflux for the 1st 11 days, the last 3 days she has had worsening reflux.  she states, "The food barely gets down before it comes back up, and it just bubbles up real bad at night.. I had to go to sleep in a chair for the last 2 nights"  Denies pain,bleeding fever.   1) See Dr.Brodie on 11-14-09 at 11:30am 2) Increase Zegerid OTC to two times a day until appt. 3) Soft,bland diet. No spicy,greasy,fried foods. 4) Eat smaller, more frequent meals. Be careful with breads,meat and rice.  5) If symptoms become worse call back immediately or go to ER.  Follow-up by: Laureen Ochs LPN,  November 08, 2009 2:45 PM  Additional Follow-up for Phone Call Additional follow up Details #1::        Add Carafate slurry 10cc by mouth two times a day, # 12 oz, 1 refill , take till she sees me in the office, Additional Follow-up by: Hart Carwin MD,  November 09, 2009 10:42 PM    Additional Follow-up for Phone Call Additional follow up Details #2::    Above MD orders reviewed with patient. Med to her pharmacy. Pt. to keep scheduled office visit. Pt. instructed to call back as needed.  Follow-up by: Laureen Ochs LPN,  November 11, 2009 8:37 AM  New/Updated Medications: SUCRALFATE 1 GM/10ML SUSP (SUCRALFATE) Take 10cc by mouth twice daily. Prescriptions: SUCRALFATE 1 GM/10ML SUSP (SUCRALFATE) Take 10cc by mouth twice daily.  #12oz. x 1   Entered by:   Laureen Ochs LPN   Authorized by:   Hart Carwin MD   Signed by:   Laureen Ochs LPN on 09/81/1914   Method used:   Electronically to        [facility] (retail)       [address]       [place], [place]  [address]       Ph: [phone] or [phone]       Fax: [phone]   RxID:   [id number]

## 2010-09-23 NOTE — Assessment & Plan Note (Signed)
Summary: WORSENING GERD/DYSPHAGIA             Miranda [name]   History of Present Illness Visit Type: Follow-up Visit Primary GI MD: Lina Sar MD Primary Provider: Hamilton Capri, MD Chief Complaint: Worsening GERD and dysphagia, No reflux since doubling the Zegerid and taking carafate History of Present Illness:   This is a 70 year old white female who has irritable bowel syndrome with predominant constipation. She has increased gastroesophageal reflux and dysphagia which has responded to increasing Zegerid to twice a day and in addition, Carafate slurry 10 cc twice a day. She currently has no complaints. Her past history includes a normal colonoscopy in August 2003 except for a tortuous colon. She had a normal upper endoscopy in September 2003. An anorectal manometry in August 2006 showed weak internal anal pressure and weak squeeze. She has lactose intolerance and a positive hydrogen breath test for bacterial overgrowth. An upper abdominal ultrasound in June 2010 showed a prominent common bile duct of 6.6 mm and mild intrahepatic prominence. She is due for an upper and lower endoscopy. Patient has an appointment with Dr. Antoine Poche for cardiomyopathy.   GI Review of Systems    Reports acid reflux, belching, and  bloating.      Denies abdominal pain, chest pain, dysphagia with liquids, dysphagia with solids, heartburn, loss of appetite, nausea, vomiting, vomiting blood, weight loss, and  weight gain.      Reports irritable bowel syndrome.     Denies anal fissure, black tarry stools, change in bowel habit, constipation, diarrhea, diverticulosis, fecal incontinence, heme positive stool, hemorrhoids, jaundice, light color stool, liver problems, rectal bleeding, and  rectal pain.    Current Medications (verified): 1)  Align   Caps (Misc Intestinal Flora Regulat) .... Take 1 Tablet By Mouth Once A Day 2)  Ramipril 2.5 Mg  Caps (Ramipril) .... Take 1 Tab By Mouth Two Times A Day 3)  Aspirin 81 Mg  Tbec  (Aspirin) .... Take 1 Tablet By Mouth Once A Day 4)  Gabapentin 100 Mg  Caps (Gabapentin) .... Take 1 Tablet By Mouth Two Times A Day 5)  Fish Oil   Oil (Fish Oil) .... Take 1 Tablet By Mouth Two Times A Day 6)  Ca With Vitamin D .... One By Mouth Daily 7)  Tamoxifen Citrate 20 Mg Tabs (Tamoxifen Citrate) .... Daily 8)  Furosemide 20 Mg Tabs (Furosemide) .... Take 1 Tablet By Mouth Once A Day As Needed 9)  Potassium Chloride Crys Cr 10 Meq Cr-Tabs (Potassium Chloride Crys Cr) .... Take 1 Tablet By Mouth Once A Day 10)  Carvedilol 6.25 Mg Tabs (Carvedilol) .Marland Kitchen.. 1 By Mouth Two Times A Day 11)  Vit B12 Inj .... Every Month 12)  Sucralfate 1 Gm/49ml Susp (Sucralfate) .... Take 10cc By Mouth Twice Daily. 13)  Evista 60 Mg Tabs (Raloxifene Hcl) .Marland Kitchen.. 1 By Mouth Once Daily 14)  Zegerid 40-1100 Mg Caps (Omeprazole-Sodium Bicarbonate) .Marland Kitchen.. 1 By Mouth Two Times A Day  Allergies (verified): 1)  ! * Statins  Past History:  Past Medical History: Last updated: 06/24/2009 SYNCOPE (ICD-780.2) EDEMA (ICD-782.3) DIZZINESS (ICD-780.4) HEMORRHOIDS, INTERNAL (ICD-455.0) LACTOSE INTOLERANCE (ICD-271.3) DEPRESSION (ICD-311) ANXIETY (ICD-300.00) IRRITABLE BOWEL SYNDROME (ICD-564.1) DYSLIPIDEMIA (ICD-272.4) HYPERTENSION (ICD-401.9) CORONARY ARTERY DISEASE (left main 20% stenosis, the LAD luminal irregularity 40%   stenosis, the ramus intermediate 95% ostial stenosis.  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 No FH of Colon Cancer: Family History of Heart Disease: Mother  Noncontributory for early coronary disease.  Both her  parents died  at later ages of heart failure.  Social History: Reviewed history from 11/16/2008 and no changes required. Occupation: Division of [employer] Alcohol Use - yes-infrequently Patient is a former smoker. -stopped 20 + years ago Retired  Divorced   Review of Systems       The patient complains of arthritis/joint pain and back pain.  The patient denies allergy/sinus, anemia, anxiety-new, blood in urine, breast changes/lumps, change in vision, confusion, cough, coughing up blood, depression-new, fainting, fatigue, fever, headaches-new, hearing problems, heart murmur, heart rhythm changes, itching, menstrual pain, muscle pains/cramps, night sweats, nosebleeds, pregnancy symptoms, shortness of breath, skin rash, sleeping problems, sore throat, swelling of feet/legs, swollen lymph glands, thirst - excessive , urination - excessive , urination changes/pain, urine leakage, vision changes, and voice change.         Pertinent positive and negative review of systems were noted in the above HPI. All other ROS was otherwise negative.   Vital Signs:  Patient profile:   70 year old female Height:      64 inches Weight:      134 pounds BMI:     23.08 BSA:     1.65 Pulse rate:   64 / minute Pulse rhythm:   irregular BP sitting:   100 / 72  (left arm)  Vitals Entered By: Merri Ray CMA Duncan Dull) (November 14, 2009 11:23 AM)  Physical Exam  General:  thin, frail appearing. Walks with a walker. Eyes:  PERRLA, no icterus. Mouth:  No deformity or lesions, dentition normal. Neck:  bilateral carotid bruits. Lungs:  fine inspiratory crackles. Heart:  split S1 and S2 with irregular beats. Abdomen:  decreased muscle tone. Abdomen is slightly tympanitic and protuberant. There is no tenderness or mass. Liver edge is at the costal margin. Extremities:  Bilateral pedal edema. Skin:  Intact without significant lesions or rashes. Psych:  Alert and cooperative. Normal mood and affect.   Impression &  Recommendations:  Problem # 1:  IRRITABLE BOWEL SYNDROME (ICD-564.1) Patient has irritable bowel syndrome currently under good control. She is due for a colonoscopy if cleared by her cardiologist. Dr Antoine Poche in May 2011 appointmnet  Problem # 2:  EPIGASTRIC PAIN (ICD-789.06) Patient has had resolution of her epigastric pain and gastroesophageal reflux on Zegerid 40 mg twice a day and Carafate slurry. She will decrease her Zegerid back down to a one a day dose and discontinue the Carafate in the next 24/48 hours. She will need an upper endoscopy if she is cleared by her cardiologist.  Patient Instructions: 1)  Follow up with Dr Antoine Poche. 2)  Will plan upper endoscopy and colonoscopy in June 2011. 3)  Decrease Zegerid to once daily. 4)  Carafate slurry 10 cc p.o. b.i.d. for 24-48 hours and discontinue. 5)  Continue probiotic daily. 6)  Copy sent to : Dr Kathie Rhodes.Noorani 7)  The medication list was reviewed and reconciled.  All changed / newly prescribed medications were explained.  A complete medication list was provided to the patient / caregiver.

## 2010-09-23 NOTE — Progress Notes (Signed)
Summary: Condition Update  Phone Note Call from Patient Call back at Home Phone [phone]   Caller: Patient Call For: Miranda [name] Reason for Call: Talk to Nurse Complaint: Breathing Problems Summary of Call: Patient wants to update Dr Miranda [name] on her condition, she is taking Carafate and Prilosec. Initial call taken by: Tawni Levy,  Dec 26, 2009 3:20 PM  Follow-up for Phone Call        Pt. is taking Carafate two times a day and Prilosec once daily. States she is doing much better. States when she eats too much she has reflux.  Occ. increased belching. Continues to feel the reflux "bubble up" at bedtime,so she goes to sleep in the recliner, wakes up later in the night and goes onto bed, no reflux then.  1) Continue above meds. May use Prilosec at [facility]. 2) Try not to overeat. Try not to eat after 7pm. 3) Gas-x,Phazyme, etc. as needed for gas & bloating. 4) Put HOB on 6 inch blocks to help the nighttime reflux.  She saw her Cardiologist, Dr.Hochrein, on Tuesday.  She said he was O.K. for her to have her Endo/Colon, but she will wait until she completes her cardiology work-up in [place] at [facility]. Pt. will call back to schedule procedures. Pt. instructed to call back as needed.  Follow-up by: Laureen Ochs LPN,  Dec 26, 3084 3:30 PM  Additional Follow-up for Phone Call Additional follow up Details #1::        reviewed and agree. Additional Follow-up by: Hart Carwin MD,  Dec 26, 2009 6:04 PM

## 2010-09-23 NOTE — Progress Notes (Signed)
Summary: Condition Update  Phone Note Call from Patient Call back at Home Phone [phone]   Caller: Patient Call For: Dr. Juanda Chance Reason for Call: Talk to Nurse, Talk to Doctor Summary of Call: Pt quit taking the Carafate for a few days and symptoms returned. Initial call taken by: Karna Christmas,  December 18, 2009 9:46 AM  Follow-up for Phone Call        No answer, I will try back later. Laureen Ochs LPN  December 18, 2009 10:08 AM     Last OV 11-14-09. Was instructed to taper off Carafate Slurry and decrease Zegerid to once daily.  Pt. attempted to stop Carafate, but after 3 days her reflux got worse, so she resumed Carafate. Stopped Zegerid all together. Pt. got a little confused with her instructions. Also, pt. wants to switch to Prilosec OTC, doesn't want to use Zegerid.  1) Prilosec OTC two times a day for 5 days, then once daily, EVERYDAY. 2) Carafate Slurry two times a day for 2-3 days, then stop.  3) Soft,bland diet. No spicy,greasy,fried foods. Advance diet as tolerated. 4) Call back with an update next week. Call sooner as needed. Follow-up by: Laureen Ochs LPN,  December 18, 2009 10:44 AM  Additional Follow-up for Phone Call Additional follow up Details #1::        continue Carafate slurry 10cc by mouth two times a day as long as it is helping, give refills Additional Follow-up by: Hart Carwin MD,  December 18, 2009 3:30 PM    Additional Follow-up for Phone Call Additional follow up Details #2::    Above MD orders reviewed with patient. Pt. instructed to call back as needed.  Follow-up by: Laureen Ochs LPN,  December 18, 2009 3:46 PM  Prescriptions: SUCRALFATE 1 GM/10ML SUSP (SUCRALFATE) Take 10cc by mouth twice daily.  #600cc x 6   Entered by:   Laureen Ochs LPN   Authorized by:   Hart Carwin MD   Signed by:   Laureen Ochs LPN on 09/81/1914   Method used:   Electronically to        [facility] (retail)       [address]  [place], [place]  [address]       Ph: [phone] or [phone]       Fax: [phone]   RxID:   [id number]

## 2010-09-23 NOTE — Medication Information (Signed)
Summary: rov/ewj  Anticoagulant Therapy  Managed by: Weston Brass, PharmD PCP: Hamilton Capri, MD Supervising MD: Shirlee Latch MD, [place] Indication 1: Atrial Fibrillation Lab Used: [facility] [place] Site: [address] INR POC 2.4 INR RANGE 2.0-3.0  Dietary changes: no    Health status changes: no    Bleeding/hemorrhagic complications: no    Recent/future hospitalizations: no    Any changes in medication regimen? yes       Details: increased Nexium to 40mg  qd  Recent/future dental: no  Any missed doses?: no       Is patient compliant with meds? yes       Allergies: 1)  ! * Statins  Anticoagulation Management History:      The patient is taking warfarin and comes in today for a routine follow up visit.  Positive risk factors for bleeding include an age of 71 years or older and history of GI bleeding.  The bleeding index is 'intermediate risk'.  Positive CHADS2 values include History of HTN.  Negative CHADS2 values include Age > 68 years old.  Her last INR was 1.2.  Anticoagulation responsible provider: Shirlee Latch MD, [place].  INR POC: 2.4.  Cuvette Lot#: [id number].  Exp: 07/2011.    Anticoagulation Management Assessment/Plan:      The patient's current anticoagulation dose is Warfarin sodium 2.5 mg tabs: one every evening or as directed.  The target INR is 2.0-3.0.  The next INR is due 07/07/2010.  Anticoagulation instructions were given to patient.  Results were reviewed/authorized by Weston Brass, PharmD.  She was notified by Ilean Skill D candidate.         Prior Anticoagulation Instructions: INR 2.6  Continue on same dosage 1 tablet daily except 2 tablets on Tuesdays, Thursdays, and Saturdays.  Recheck in 2 weeks.    Current Anticoagulation Instructions: INR 2.4  Continue taking same dose of 1 tablet everyday except 2 tablets on Tuesday, Thursday, and Saturday. Recheck in 2 weeks.

## 2010-09-23 NOTE — Medication Information (Signed)
Summary: rov/mw  Anticoagulant Therapy  Managed by: Cloyde Reams, RN, BSN PCP: Hamilton Capri, MD Supervising MD: Jens Som MD, Arlys John Indication 1: Atrial Fibrillation Lab Used: [facility] [place] Site: [address] INR POC 3.9 INR RANGE 2.0-3.0  Dietary changes: yes       Details: Decr appetite.   Health status changes: no    Bleeding/hemorrhagic complications: yes       Details: May have had a sl tinged of blood in urine, has frequent UTI's and took Carafate this am.  Will continue to monitor.   Recent/future hospitalizations: no    Any changes in medication regimen? no    Recent/future dental: no  Any missed doses?: no       Is patient compliant with meds? yes       Allergies: 1)  ! * Statins  Anticoagulation Management History:      The patient is taking warfarin and comes in today for a routine follow up visit.  Positive risk factors for bleeding include an age of 71 years or older and history of GI bleeding.  The bleeding index is 'intermediate risk'.  Positive CHADS2 values include History of HTN.  Negative CHADS2 values include Age > 48 years old.  Her last INR was 1.2.  Anticoagulation responsible Kaiden Dardis: Jens Som MD, Arlys John.  INR POC: 3.9.  Cuvette Lot#: [id number].  Exp: 06/2011.    Anticoagulation Management Assessment/Plan:      The patient's current anticoagulation dose is Warfarin sodium 2.5 mg tabs: one every evening or as directed.  The target INR is 2.0-3.0.  The next INR is due 06/09/2010.  Anticoagulation instructions were given to patient.  Results were reviewed/authorized by Cloyde Reams, RN, BSN.  She was notified by Cloyde Reams RN.         Prior Anticoagulation Instructions: INR 3.9 Skip today's dose. Change your monday and friday dose to 1 tablet. And 2 tablets all other days. Recheck in 10 days.   Current Anticoagulation Instructions: INR 3.9  Skip today's dosage of Coumadin, then start taking 1 tablet daily except 2 tablets on  Tuesdays, Thursdays, and Saturdays.  Recheck in 10 days.

## 2010-09-23 NOTE — Medication Information (Signed)
Summary: rov/sel  Anticoagulant Therapy  Managed by: Weston Brass, PharmD PCP: Hamilton Capri, MD Supervising MD: Ladona Ridgel MD, Sharlot Gowda Indication 1: Atrial Fibrillation Lab Used: [facility]  Site: [address] INR POC 2.8 INR RANGE 2.0-3.0  Dietary changes: no    Health status changes: no    Bleeding/hemorrhagic complications: no    Recent/future hospitalizations: no    Any changes in medication regimen? no    Recent/future dental: no  Any missed doses?: no       Is patient compliant with meds? yes      Comments: Pt is [place] [facility] in [place] and will continue to have blood check there and will have results sent to use for dosing, per pt.   Allergies: 1)  ! * Statins  Anticoagulation Management History:      The patient is taking warfarin and comes in today for a routine follow up visit.  Positive risk factors for bleeding include an age of 5 years or older and history of GI bleeding.  The bleeding index is 'intermediate risk'.  Positive CHADS2 values include History of HTN.  Negative CHADS2 values include Age > 70 years old.  Her last INR was 1.2.  Anticoagulation responsible provider: Ladona Ridgel MD, Sharlot Gowda.  INR POC: 2.8.  Cuvette Lot#: [id number].  Exp: 07/2011.    Anticoagulation Management Assessment/Plan:      The patient's current anticoagulation dose is Warfarin sodium 2.5 mg tabs: one every evening or as directed.  The target INR is 2.0-3.0.  The next INR is due 07/28/2010.  Anticoagulation instructions were given to patient.  Results were reviewed/authorized by Weston Brass, PharmD.  She was notified by Hoy Register, PharmD Candidate.         Prior Anticoagulation Instructions: INR 2.4  Continue taking same dose of 1 tablet everyday except 2 tablets on Tuesday, Thursday, and Saturday. Recheck in 2 weeks.   Current Anticoagulation Instructions: INR 2.8 Continue previous dose of 1 tablet everyday except 2 tablets on Tuesday, Thursday, and  Saturday Follow up with lab in [place] in 3 weeks

## 2010-09-23 NOTE — Miscellaneous (Signed)
Summary: Orders Update  Clinical Lists Changes  Problems: Added new problem of CAROTID ARTERY DISEASE (ICD-433.10) Orders: Added new Test order of Carotid Duplex (Carotid Duplex) - Signed 

## 2010-09-23 NOTE — Medication Information (Signed)
Summary: [facility]  Anticoagulant Therapy  Managed by: Weston Brass, PharmD PCP: Hamilton Capri, MD Supervising MD: Eden Emms MD, Theron Arista Indication 1: Atrial Fibrillation Lab Used: [facility]  Site: [address] INR POC 1.62 INR RANGE 2.0-3.0  Dietary changes: yes    Health status changes: no    Bleeding/hemorrhagic complications: no    Recent/future hospitalizations: yes       Details: went to ER in [place] last weekend.  INR was 4.26 and pt received vit k  Any changes in medication regimen? no    Recent/future dental: no  Any missed doses?: no       Is patient compliant with meds? yes       Allergies: 1)  ! * Statins  Anticoagulation Management History:      The patient is taking warfarin and comes in today for a routine follow up visit.  Positive risk factors for bleeding include an age of 71 years or older and history of GI bleeding.  The bleeding index is 'intermediate risk'.  Positive CHADS2 values include History of HTN.  Negative CHADS2 values include Age > 71 years old.  Her last INR was 1.2.  Anticoagulation responsible provider: Eden Emms MD, Theron Arista.  INR POC: 1.62.  Exp: 07/2011.    Anticoagulation Management Assessment/Plan:      The patient's current anticoagulation dose is Warfarin sodium 2.5 mg tabs: one every evening or as directed.  The target INR is 2.0-3.0.  The next INR is due 08/12/2010.  Anticoagulation instructions were given to patient.  Results were reviewed/authorized by Weston Brass, PharmD.  She was notified by Weston Brass PharmD.         Prior Anticoagulation Instructions: INR 2.8 Continue previous dose of 1 tablet everyday except 2 tablets on Tuesday, Thursday, and Saturday Follow up with lab in [place] in 3 weeks

## 2010-09-23 NOTE — Letter (Signed)
Summary: [facility] Office Note  [facility] Office Note   Imported By: Roderic Ovens 11/07/2009 11:22:34  _____________________________________________________________________  External Attachment:    Type:   Image     Comment:   External Document

## 2010-09-23 NOTE — Assessment & Plan Note (Addendum)
Summary: to discuss information from Miranda [name] per   Visit Type:  Follow-up Referring Alayha Babineaux:  n/a Primary Merlene Dante:  Hamilton Capri, MD  CC:  Cardiomyopathy.  History of Present Illness: The patient presents for followup of her cardiomyopathy. Since I last saw her she has been in the [facility] for followup of amyloidosis. She had meds adjusted to include increase gabapentin which has helped with her neuropathy and GI problems. She was given Lomotil which has helped the diarrhea. Apparently she had an echo which suggested by her description pericardial effusion and pleural effusion. I do not yet have these records. She was told to take a higher dose of Lasix and to lose 5 pounds. She has actually lost 20 pounds since that appointment. She mentions a suggestion of possibly starting Aldactone. This was not started. Finally it was suggested that she was in atrial fibrillation and need anticoagulation but this was also not started.  Patient actually think she's feeling better with her med adjustments. She was a little lightheaded this morning for the first time. She's not had any presyncope or syncope or orthostatic symptoms. She's had no new shortness of breath, PND or orthopnea. She's had less lower extremity swelling. She denies chest pressure, neck or arm discomfort.  Current Medications (verified): 1)  Align   Caps (Misc Intestinal Flora Regulat) .... Take 1 Tablet By Mouth Once A Day 2)  Ramipril 2.5 Mg  Caps (Ramipril) .... Hold 3)  Aspirin 81 Mg  Tbec (Aspirin) .... Take 1 Tablet By Mouth Once A Day 4)  Gabapentin 100 Mg  Caps (Gabapentin) .... Take 1-3 As Needed Per Day 5)  Caltrate 600 1500 Mg Tabs (Calcium Carbonate) .... Hold 6)  Furosemide 20 Mg Tabs (Furosemide) .... 3 By Mouth Daily 7)  Potassium Chloride Crys Cr 10 Meq Cr-Tabs (Potassium Chloride Crys Cr) .... 3 By Mouth Daily 8)  Cyanocobalamin 1000 Mcg/ml Soln (Cyanocobalamin) .... One Injection Im Once Monthly 9)  Sucralfate 1  Gm/14ml Susp (Sucralfate) .... Take 10cc By Mouth 4 Times Daily. 10)  Evista 60 Mg Tabs (Raloxifene Hcl) .... Hold 11)  Prilosec 40 Mg  Cpdr (Omeprazole) .Marland Kitchen.. 1 Twice A Day 30 Minutes Before Meals 12)  Carvedilol 3.125 Mg Tabs (Carvedilol) .... Hold  Allergies (verified): 1)  ! * Statins  Past History:  Past Medical History: Reviewed history from 04/04/2010 and no changes required. SYNCOPE (ICD-780.2) EDEMA (ICD-782.3) DIZZINESS (ICD-780.4) HEMORRHOIDS, INTERNAL (ICD-455.0) LACTOSE INTOLERANCE (ICD-271.3) DEPRESSION (ICD-311) ANXIETY (ICD-300.00) IRRITABLE BOWEL SYNDROME (ICD-564.1) DYSLIPIDEMIA (ICD-272.4) HYPERTENSION (ICD-401.9) CORONARY ARTERY DISEASE (left main 20% stenosis, the LAD luminal irregularity 40%   stenosis, the ramus intermediate 95% ostial stenosis.  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 Appendectomy Hemorrhoidectomy Carpal Tunnel release Cystoscopy and cold cup bladder biopsy and fulguration Nerve and muscle biopsy  Review of Systems       As stated in the HPI and negative for all other systems.   Vital Signs:  Patient profile:   70 year old female Height:      64 inches Weight:      114 pounds BMI:     19.64 Pulse rate:   71 / minute Resp:     16 per minute BP sitting:   81 / 46  (left arm)  Vitals Entered By: Marrion Coy, CNA (May 02, 2010 9:48 AM)  Physical Exam  General:  Frail appearing but in no distress Head:  normocephalic and atraumatic Neck:  No jugular venous distention at 90 Chest Wall:  no deformities or breast masses noted Lungs:  Clear bilaterally to auscultation and percussion. Abdomen:  Positive bowel sounds, no rebound, no guarding, exam compromised by the patient  being seated in the wheelchair Msk:  Diffuse muscle wasting Neurologic:  diffuse motor weakness, cranial nerves grossly intact Skin:  Intact without lesions or rashes. Cervical Nodes:  no significant adenopathy Axillary Nodes:  no significant adenopathy Psych:  Normal affect.   Detailed Cardiovascular Exam  Neck    Carotids: Carotids full and equal bilaterally without bruits.      Neck Veins: Normal, no JVD.    Heart    Inspection: no deformities or lifts noted.      Palpation: normal PMI with no thrills palpable.      Auscultation: regular rate and rhythm, S1, S2 without murmurs, rubs, gallops, or clicks.    Vascular    Abdominal Aorta: no palpable masses, pulsations, or audible bruits.      Femoral Pulses: normal femoral pulses bilaterally.      Pedal Pulses: 2+ upper pulses    Radial Pulses: normal radial pulses bilaterally.      Peripheral Circulation: no clubbing, cyanosis, mild bilateral ankle edema   EKG  Procedure date:  05/02/2010  Findings:      Normal sinus rhythm right bundle branch block  Impression & Recommendations:  Problem # 1:  CARDIOMYOPATHY (ICD-425.4) She is very hypotensive today appears she's lost quite a weight with her increased diuresis. She has not tolerated carvedilol or Altace in the past. Therefore, I do not think Aldactone or other therapy is an option. Certainly we can keep her euvolemic. I will need to check a basic metabolic profile today and asked her to reduce her Lasix back to 40 mg a day.  I will be reviewing the records from [place] to see what they saw on their most recent echo which will allow me to judge the need for further echocardiography. Orders: TLB-BMP (Basic Metabolic Panel-BMET) (80048-METABOL)  Problem # 2:  ATRIAL ARRHYTHMIAS (ICD-427.89) The patient has had asymptomatic ectopy. However, have not seen documented fibrillation. I will contact [facility] to try to get all of their records. They had suggested blood thinners and I  assume they saw fibrillation. It may be that he uses as a protocol in amyloid patients. I will first review the records and if I have questions contact them.  Problem # 3:  SYNCOPE (ICD-780.2)  Is because of this and postural orthostatic hypotension that we are limited in her therapies. She has had no further syncopal episodes.  Her updated medication list for this problem includes:    Ramipril 2.5 Mg Caps (Ramipril) ..... Hold    Aspirin 81 Mg Tbec (Aspirin) .Marland Kitchen... Take 1 tablet by mouth once a day    Carvedilol 3.125 Mg Tabs (Carvedilol) ..... Hold  Problem # 4:  CORONARY ARTERY DISEASE (ICD-414.00)  She's having no ongoing angina. No change in therapy is indicated. Orders: EKG w/ Interpretation (93000) TLB-BMP (Basic Metabolic Panel-BMET) (80048-METABOL)  Her updated medication list for this problem includes:    Ramipril 2.5 Mg Caps (Ramipril) ..... Hold    Aspirin 81 Mg Tbec (Aspirin) .Marland Kitchen... Take 1 tablet by mouth once a day    Carvedilol 3.125 Mg Tabs (Carvedilol) ..... Hold  Patient Instructions: 1)  Your physician recommends that  you schedule a follow-up appointment as scheduled 2)  Your physician recommends that you continue on your current medications as directed. Please refer to the Current Medication list given to you today.

## 2010-09-23 NOTE — Consult Note (Signed)
Summary: [facility] Consult  [facility] Consult   Imported By: Roderic Ovens 09/19/2009 12:25:11  _____________________________________________________________________  External Attachment:    Type:   Image     Comment:   External Document

## 2010-09-23 NOTE — Assessment & Plan Note (Signed)
Summary: PER CHECK OUT/SF   Visit Type:  Follow-up Referring Provider:  n/a Primary Provider:  Hamilton Capri, MD  CC:  Cardiomyopathy.  History of Present Illness: The patient presents for evaluation of cardiomyopathy. Since I last saw her she has been keeping her blood pressure. She has become hypotensive and actually does not take the carvedilol any longer and on most days he is unable to take the  Altace because her blood pressure is low and can't get into the 70s if he takes his medication. She will typically takes Altace if her systolic is greater than 100.  Current Medications (verified): 1)  Align   Caps (Misc Intestinal Flora Regulat) .... Take 1 Tablet By Mouth Once A Day 2)  Ramipril 2.5 Mg  Caps (Ramipril) .... Take 1 Tab By Mouth Two Times A Day 3)  Aspirin 81 Mg  Tbec (Aspirin) .... Take 1 Tablet By Mouth Once A Day 4)  Gabapentin 100 Mg  Caps (Gabapentin) .... Take 1 Tablet By Mouth Two Times A Day 5)  Fish Oil   Oil (Fish Oil) .... Take 1 Tablet By Mouth Two Times A Day 6)  Ca With Vitamin D .... One By Mouth Daily 7)  Evista 60 Mg Tabs (Raloxifene Hcl) .Marland Kitchen.. 1 By Mouth Daily 8)  Furosemide 20 Mg Tabs (Furosemide) .... Take 1 Tablet By Mouth Once A Day As Needed 9)  Potassium Chloride Crys Cr 10 Meq Cr-Tabs (Potassium Chloride Crys Cr) .... Take 1 Tablet By Mouth Once A Day 10)  Carvedilol 6.25 Mg Tabs (Carvedilol) .Marland Kitchen.. 1 By Mouth Two Times A Day 11)  Vit B12 Inj .... Every Month 12)  Sucralfate 1 Gm/31ml Susp (Sucralfate) .... Take 10cc By Mouth Twice Daily. 13)  Evista 60 Mg Tabs (Raloxifene Hcl) .Marland Kitchen.. 1 By Mouth Once Daily 14)  Prilosec 20 Mg Cpdr (Omeprazole) .Marland Kitchen.. 1 By Mouth Daily  Allergies (verified): 1)  ! * Statins  Past History:  Past Medical History: Reviewed history from 06/24/2009 and no changes required. SYNCOPE (ICD-780.2) EDEMA (ICD-782.3) DIZZINESS (ICD-780.4) HEMORRHOIDS, INTERNAL (ICD-455.0) LACTOSE INTOLERANCE (ICD-271.3) DEPRESSION  (ICD-311) ANXIETY (ICD-300.00) IRRITABLE BOWEL SYNDROME (ICD-564.1) DYSLIPIDEMIA (ICD-272.4) HYPERTENSION (ICD-401.9) CORONARY ARTERY DISEASE (left main 20% stenosis, the LAD luminal irregularity 40%   stenosis, the ramus intermediate 95% ostial stenosis.  The right   coronary artery is dominant and had 40% proximal, 25% mid stenosis) (ICD-414.00) CARDIOMYOPATHY (25 - 35% Echo 5/09) (ICD-425.4) CONSTIPATION (ICD-564.00) CIPD  Past Surgical History: Reviewed history from 11/14/2009 and no changes required. Appendectomy Hemorrhoidectomy Carpal Tunnel release Cystoscopy and cold cup bladder biopsy and fulguration Nerve and muscle biopsy  Review of Systems       As stated in the HPI and negative for all other systems.   Vital Signs:  Patient profile:   69 year old female Height:      64 inches Weight:      135 pounds BMI:     23.26 Pulse rate:   78 / minute Resp:     16 per minute BP sitting:   112 / 60  (right arm)  Vitals Entered By: Marrion Coy, CNA (Dec 24, 2009 10:44 AM)  Physical Exam  General:  Well developed, well nourished, in no acute distress. Head:  normocephalic and atraumatic Eyes:  PERRLA/EOM intact; conjunctiva and lids normal. Mouth:  No deformity or lesions, dentition normal. Neck:  bilateral carotid bruits. Chest Wall:  no deformities or breast masses noted Abdomen:  decreased muscle tone. Abdomen is slightly  tympanitic and protuberant. There is no tenderness or mass. Liver edge is at the costal margin. Msk:  Back normal, normal gait. Reduced muscle strength and tone normal. Extremities:  Bilateral pedal edema. Neurologic:  Alert and oriented x 3. Skin:  Intact without significant lesions or rashes. Psych:  Alert and cooperative. Normal mood and affect.   Detailed Cardiovascular Exam  Neck    Carotids: Carotids full and equal bilaterally without bruits.      Neck Veins: Normal, no JVD.    Heart    Inspection: no deformities or lifts noted.       Palpation: normal PMI with no thrills palpable.      Auscultation: regular rate and rhythm, S1, S2 without murmurs, rubs, gallops, or clicks.    Vascular    Abdominal Aorta: no palpable masses, pulsations, or audible bruits.      Femoral Pulses: normal femoral pulses bilaterally.      Pedal Pulses: normal pedal pulses bilaterally.      Radial Pulses: normal radial pulses bilaterally.      Peripheral Circulation: no clubbing, cyanosis, severe Left lower extremity edema with bruising, mild right lower extremity edema   Impression & Recommendations:  Problem # 1:  ATRIAL PREMATURE BEATS (ICD-427.61) Her EKG again today demonstrates premature atrial contractions in a bigeminal pattern. These are not particularly symptomatic. No change in therapy is indicated.  Problem # 2:  CARDIOMYOPATHY (ICD-425.4) I will reassess with an echocardiogram. She had an EF of 25% with moderate mitral regurgitation in April of last year. This was down from previous. Unfortunately she will not tolerate much in the way of medications for this. She is to take the Altace when her blood pressure allows and will not be taking the carvedilol. At this point I do not see an indication for an ICD but will continue to evaluate for this. She has class I symptoms. Orders: EKG w/ Interpretation (93000) Echocardiogram (Echo)  Problem # 3:  WEAKNESS (ICD-780.79) I have encouraged her to seek a second opinion at [facility] that was suggested to her by neurology as the etiology of her weakness and neurologic complaints is still not clear.  Patient Instructions: 1)  Your physician recommends that you schedule a follow-up appointment in: 6 months with Dr Antoine Poche 2)  Your physician recommends that you continue on your current medications as directed. Please refer to the Current Medication list given to you today. 3)  Your physician has requested that you have an echocardiogram.  Echocardiography is a painless test that uses  sound waves to create images of your heart. It provides your doctor with information about the size and shape of your heart and how well your heart's chambers and valves are working.  This procedure takes approximately one hour. There are no restrictions for this procedure. 4)  You have been diagnosed with Congestive Heart Failure or CHF.  CHF is a condition in which a problem with the structure or function of the heart impairs its ability to supply sufficient blood flow to meet the body's needs.  For further information please visit www.cardiosmart.org for detailed information on CHF. 5)  Your physician recommends that you weigh, daily, at the same time every day, and in the same amount of clothing.  Please record your daily weights on the handout provided and bring it to your next appointment.

## 2010-09-23 NOTE — Letter (Signed)
Summary: [facility] Office Visit Note   [facility] Office Visit Note   Imported By: Roderic Ovens 04/10/2010 15:55:14  _____________________________________________________________________  External Attachment:    Type:   Image     Comment:   External Document

## 2010-09-23 NOTE — Progress Notes (Signed)
  Phone Note Call from Patient   Summary of Call: pt contacted me today from [place]...seen yesterday in ED for noncardiac problem...found to have INR 4.26, and received 5 mg of Vit K. she was told to resume her usual dosing regimen, starting today. she has had some gross hematuria, which is improving. no other bleeding diathesis. she takes 2.5 mg daily, except 5 on Sat. therefore, calling to be sure if this was reasonable.  I advised her to continue taking Coumadin, but at the mainenance dose of 2.5 mg daily. she is due for a PT/INR on Tues, with results forwarded to our clinic. she is to call me, if any additional concerns, or worsening bleeding.  Initial call taken by: Nelida Meuse, PA-C,  July 26, 2010 3:15 PM

## 2010-09-23 NOTE — Miscellaneous (Signed)
  Clinical Lists Changes  Observations: Added new observation of ECHOINTERP:  - Left ventricle: LV walls appear bright. Amyloid must be       considered. There is global hypokinesis. The apex moves better       than the other walls. The cavity size was at the upper limits of       normal. Wall thickness was increased in a pattern of moderate LVH.       The estimated ejection fraction was 30%. Doppler parameters are       consistent with a restrictive pattern, indicative of decreased       left ventricular diastolic compliance and/or increased left atrial       pressure (grade 3 or grade 4diastolic dysfunction).     - Mitral valve: Moderate regurgitation.     - Left atrium: The atrium was moderately dilated.     - Right ventricle: The cavity size was moderately dilated. Systolic       function was moderately to severely reduced.     - Right atrium: The atrium was moderately dilated.     - Tricuspid valve: Peak RV-RA gradient: 24mm Hg (S).     - Pericardium, extracardiac: A small pericardial effusion was       identified circumferential to the heart. (01/13/2010 8:08)       Echocardiogram  Procedure date:  01/13/2010  Findings:       - Left ventricle: LV walls appear bright. Amyloid must be       considered. There is global hypokinesis. The apex moves better       than the other walls. The cavity size was at the upper limits of       normal. Wall thickness was increased in a pattern of moderate LVH.       The estimated ejection fraction was 30%. Doppler parameters are       consistent with a restrictive pattern, indicative of decreased       left ventricular diastolic compliance and/or increased left atrial       pressure (grade 3 or grade 4diastolic dysfunction).     - Mitral valve: Moderate regurgitation.     - Left atrium: The atrium was moderately dilated.     - Right ventricle: The cavity size was moderately dilated. Systolic       function was moderately to severely  reduced.     - Right atrium: The atrium was moderately dilated.     - Tricuspid valve: Peak RV-RA gradient: 24mm Hg (S).     - Pericardium, extracardiac: A small pericardial effusion was       identified circumferential to the heart.

## 2010-09-23 NOTE — Progress Notes (Signed)
Summary: TRIAGE-Really bad reflux  Phone Note Call from Patient Call back at Home Phone [phone]   Call For: Dr Juanda Chance Summary of Call: What else can she do- she is still having really bad reflux. Initial call taken by: Leanor Kail [facility],  January 24, 2010 9:26 AM  Follow-up for Phone Call        Pt. c/o episodes of reflux, despite Prilosec and Carafate two times a day. She is consulting at [facility] and cannot schedule any procedures until that work-up is completed.  DR.Orlyn Odonoghue PLEASE ADVISE  Follow-up by: Laureen Ochs LPN,  January 25, 980 10:55 AM  Additional Follow-up for Phone Call Additional follow up Details #1::        She was on Zegrid last time I saw her.She can increase Prelosec dose to 40 mg by mouth two times a day, and increase her Carafate  slurry to 10cc by mouth qid. She is due for EGD/colon Additional Follow-up by: Hart Carwin MD,  January 24, 2010 2:43 PM    Additional Follow-up for Phone Call Additional follow up Details #2::    Above MD orders reviewed with patient.  Scripts to her pharmacy. Pt. instructed to call back as needed, call when she is ready to schedule her procedures.   Follow-up by: Laureen Ochs LPN,  January 24, 1913 3:40 PM  New/Updated Medications: SUCRALFATE 1 GM/10ML SUSP (SUCRALFATE) Take 10cc by mouth 4 times daily. PRILOSEC 40 MG  CPDR (OMEPRAZOLE) 1 twice a day 30 minutes before meals Prescriptions: PRILOSEC 40 MG  CPDR (OMEPRAZOLE) 1 twice a day 30 minutes before meals  #60 x 3   Entered by:   Laureen Ochs LPN   Authorized by:   Hart Carwin MD   Signed by:   Laureen Ochs LPN on 78/29/5621   Method used:   Electronically to        [facility] (retail)       [address]       [place], [place]  [address]       Ph: [phone] or [phone]       Fax: [phone]   RxID:   [id number] SUCRALFATE 1 GM/10ML SUSP (SUCRALFATE) Take 10cc by mouth 4 times daily.  #1200cc x 2   Entered  by:   Laureen Ochs LPN   Authorized by:   Hart Carwin MD   Signed by:   Laureen Ochs LPN on 32/95/1884   Method used:   Electronically to        [facility] (retail)       [address]       [place], [place]  [address]       Ph: [phone] or [phone]       Fax: [phone]   RxID:   [id number]

## 2010-09-23 NOTE — Letter (Signed)
Summary: [facility] Surgical Clearance  [facility] Surgical Clearance   Imported By: Roderic Ovens 09/24/2009 12:55:58  _____________________________________________________________________  External Attachment:    Type:   Image     Comment:   External Document

## 2010-09-23 NOTE — Medication Information (Signed)
Summary: Omeprazole Approved/[facility]  Omeprazole Approved/[facility]   Imported By: Sherian Rein 05/08/2010 14:49:35  _____________________________________________________________________  External Attachment:    Type:   Image     Comment:   External Document

## 2010-09-23 NOTE — Medication Information (Signed)
Summary: initial/amr  Anticoagulant Therapy  Managed by: Bethena Midget, RN, BSN PCP: Hamilton Capri, MD Supervising MD: Myrtis Ser MD, Tinnie Gens Indication 1: Atrial Fibrillation Lab Used: [facility] [place] Site: [address] INR POC 1.8  Dietary changes: no    Health status changes: no    Bleeding/hemorrhagic complications: no    Recent/future hospitalizations: no    Any changes in medication regimen? no    Recent/future dental: no  Any missed doses?: no       Is patient compliant with meds? yes       Allergies: 1)  ! * Statins  Anticoagulation Management History:      The patient is taking warfarin and comes in today for a routine follow up visit.  Positive risk factors for bleeding include an age of 71 years or older and history of GI bleeding.  The bleeding index is 'intermediate risk'.  Positive CHADS2 values include History of HTN.  Negative CHADS2 values include Age > 71 years old.  Her last INR was 1.2.  Anticoagulation responsible provider: Myrtis Ser MD, Tinnie Gens.  INR POC: 1.8.  Cuvette Lot#: [id number].  Exp: 06/2011.    Anticoagulation Management Assessment/Plan:      The patient's current anticoagulation dose is Warfarin sodium 2.5 mg tabs: one every evening or as directed.  The target INR is 2.0-3.0.  The next INR is due 05/21/2010.  Anticoagulation instructions were given to patient.  Results were reviewed/authorized by Bethena Midget, RN, BSN.  She was notified by Kennieth Francois.         Prior Anticoagulation Instructions: 05/12/2010 INR 1.2  (goal INR is 2.0-3.0)  Start taking 2 tablets daily (5mg  a day) and return on Friday to check INR.  Current Anticoagulation Instructions: INR 1.8  Continue taking two tablets every day.  We will see you in 5 days.

## 2010-09-23 NOTE — Assessment & Plan Note (Signed)
Summary: low BP per daughter  pfh,rn   Visit Type:  Follow-up Referring Provider:  n/a Primary Provider:  Hamilton Capri, MD  CC:  Cardiomyopathy.  History of Present Illness: The patient presents for followup of her cardiomyopathy. A couple of days she is due to go to [facility] to evaluate the etiology of lower extremity weakness. Recently a followup echocardiogram confirmed that her ejection fraction was still about 30% and there was a question of a speckled pattern consistent with amyloid. I referred her for kappa light chains but she informed us that she's had a workup for amyloid done by her neurologist and that she was told she did not have this. At the same time neurologists or pathologist at [facility] had suggested the possibility of amyloid on her biopsies. The patient also had several questions such as what dose of diuretic she should be taking and whether or not she should be taking carvedilol. She came back to discuss all of this.  She has been off of her carvedilol but has noticed no significant change in fatigue. She was taking 6.25 b.i.d. She has lost 7 pounds and has much less lower extremity edema since holding gabapentin. She is also wearing compression stockings. She does feel better in her legs with less edema. She is not having a new shortness of breath, PND or orthopnea. She is not having any new palpitations, presyncope or syncope. She has no chest discomfort.  Current Medications (verified): 1)  Align   Caps (Misc Intestinal Flora Regulat) .... Take 1 Tablet By Mouth Once A Day 2)  Ramipril 2.5 Mg  Caps (Ramipril) .... Take 1 Tab By Mouth Two Times A Day 3)  Aspirin 81 Mg  Tbec (Aspirin) .... Take 1 Tablet By Mouth Once A Day 4)  Gabapentin 100 Mg  Caps (Gabapentin) .... Take 1 Tablet By Mouth Two Times A Day 5)  Fish Oil   Oil (Fish Oil) .... Take 1 Tablet By Mouth Two Times A Day 6)  Ca With Vitamin D .... One By Mouth Daily 7)  Evista 60 Mg Tabs (Raloxifene  Hcl) .Marland Kitchen.. 1 By Mouth Daily 8)  Furosemide 20 Mg Tabs (Furosemide) .... Take 1 Tablet By Mouth Once A Day As Needed 9)  Potassium Chloride Crys Cr 10 Meq Cr-Tabs (Potassium Chloride Crys Cr) .... Take 1 Tablet By Mouth Once A Day 10)  Carvedilol 6.25 Mg Tabs (Carvedilol) .Marland Kitchen.. 1 By Mouth Two Times A Day 11)  Vit B12 Inj .... Every Month 12)  Sucralfate 1 Gm/18ml Susp (Sucralfate) .... Take 10cc By Mouth 4 Times Daily. 13)  Evista 60 Mg Tabs (Raloxifene Hcl) .Marland Kitchen.. 1 By Mouth Once Daily 14)  Prilosec 40 Mg  Cpdr (Omeprazole) .Marland Kitchen.. 1 Twice A Day 30 Minutes Before Meals  Allergies (verified): 1)  ! * Statins  Past History:  Past Medical History: SYNCOPE (ICD-780.2) EDEMA (ICD-782.3) DIZZINESS (ICD-780.4) HEMORRHOIDS, INTERNAL (ICD-455.0) LACTOSE INTOLERANCE (ICD-271.3) DEPRESSION (ICD-311) ANXIETY (ICD-300.00) IRRITABLE BOWEL SYNDROME (ICD-564.1) DYSLIPIDEMIA (ICD-272.4) HYPERTENSION (ICD-401.9) CORONARY ARTERY DISEASE (left main 20% stenosis, the LAD luminal irregularity 40%   stenosis, the ramus intermediate 95% ostial stenosis.  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 Appendectomy Hemorrhoidectomy Carpal Tunnel release Cystoscopy and cold cup bladder biopsy and fulguration Nerve and muscle biopsy  Review of Systems       As  stated in the HPI and negative for all other systems.   Vital Signs:  Patient profile:   70 year old female Height:      64 inches Weight:      123 pounds BMI:     21.19 Pulse rate:   90 / minute Resp:     16 per minute BP sitting:   102 / 68  (left arm)  Vitals Entered By: Marrion Coy, CNA (January 28, 2010 2:12 PM)  Physical Exam  General:  Well developed, well nourished, in no acute distress. Head:  normocephalic and atraumatic Eyes:  PERRLA/EOM  intact; conjunctiva and lids normal. Mouth:  No deformity or lesions, dentition normal. Neck:  bilateral carotid bruits. Chest Wall:  no deformities or breast masses noted Lungs:  fine inspiratory crackles. Abdomen:  decreased muscle tone. Abdomen is slightly tympanitic and protuberant. There is no tenderness or mass. Liver edge is at the costal margin. Msk:  Back normal, normal gait. Reduced muscle strength and tone normal. Extremities:  Bilateral pedal edema. Neurologic:  Alert and oriented x 3. Skin:  Intact without significant lesions or rashes. Cervical Nodes:  no significant adenopathy Axillary Nodes:  no significant adenopathy Inguinal Nodes:  no significant adenopathy Psych:  Alert and cooperative. Normal mood and affect.   Detailed Cardiovascular Exam  Neck    Carotids: Carotids full and equal bilaterally without bruits.      Neck Veins: Normal, no JVD.    Heart    Inspection: no deformities or lifts noted.      Palpation: normal PMI with no thrills palpable.      Auscultation: regular rate and rhythm, S1, S2 without murmurs, rubs, gallops, or clicks.    Vascular    Abdominal Aorta: no palpable masses, pulsations, or audible bruits.      Femoral Pulses: normal femoral pulses bilaterally.      Pedal Pulses: normal pedal pulses bilaterally.      Radial Pulses: normal radial pulses bilaterally.      Peripheral Circulation: no clubbing, cyanosis, moderate Left lower extremity edema, mild right lower extremity edema   Impression & Recommendations:  Problem # 1:  EDEMA (ICD-782.3) This is much improved. She will continue Lasix 20 mg b.i.d. and compression stockings.  Problem # 2:  CARDIOMYOPATHY (ICD-425.4) I did restart carvedilol at 3.125 mg b.i.d. I will again try to titrate as blood pressure allows.  Problem # 3:  CORONARY ARTERY DISEASE (ICD-414.00) She is having no new symptoms. No further cardiovascular testing is suggested. She will continue with risk  reduction.  Patient Instructions: 1)  Your physician recommends that you schedule a follow-up appointment in: 2 months with Dr Antoine Poche 2)  Your physician has recommended you make the following change in your medication: start carvedilol 3.125 mg twice a day Prescriptions: CARVEDILOL 3.125 MG TABS (CARVEDILOL) one twice a day  #60 x 11   Entered by:   Charolotte Capuchin, RN   Authorized by:   Rollene Rotunda, MD, [facility]   Signed by:   Charolotte Capuchin, RN on 01/28/2010   Method used:   Electronically to        [facility]* (retail)       [address]       [place], [place]  [address]       Ph: [phone] or [phone]       Fax: [phone]   RxID:   [id number]

## 2010-09-23 NOTE — Letter (Signed)
Summary: [facility] Report   [facility] Report   Imported By: Roderic Ovens 04/09/2010 10:41:40  _____________________________________________________________________  External Attachment:    Type:   Image     Comment:   External Document

## 2010-09-23 NOTE — Progress Notes (Signed)
Summary: [facility]   [facility]   Imported By: Roderic Ovens 01/23/2010 11:34:30  _____________________________________________________________________  External Attachment:    Type:   Image     Comment:   External Document

## 2010-09-23 NOTE — Medication Information (Signed)
Summary: new coumadin  Anticoagulant Therapy  Managed by: Louann Sjogren, PharmD PCP: Hamilton Capri, MD Supervising MD: Excell Seltzer MD, Casimiro Needle Indication 1: Atrial Fibrillation Lab Used: [facility] [place] Site: [address] INR POC 1.2  Dietary changes: no    Health status changes: no    Bleeding/hemorrhagic complications: no    Recent/future hospitalizations: no    Any changes in medication regimen? no    Recent/future dental: no  Any missed doses?: no       Is patient compliant with meds? yes       Current Medications (verified): 1)  Align   Caps (Misc Intestinal Flora Regulat) .... Take 1 Tablet By Mouth Once A Day 2)  Ramipril 2.5 Mg  Caps (Ramipril) .... Hold 3)  Aspirin 81 Mg  Tbec (Aspirin) .... Take 1 Tablet By Mouth Once A Day 4)  Gabapentin 100 Mg  Caps (Gabapentin) .... Take 1-3 As Needed Per Day 5)  Caltrate 600 1500 Mg Tabs (Calcium Carbonate) .... Hold 6)  Furosemide 20 Mg Tabs (Furosemide) .... 3 By Mouth Daily 7)  Potassium Chloride Crys Cr 10 Meq Cr-Tabs (Potassium Chloride Crys Cr) .... 3 By Mouth Daily 8)  Cyanocobalamin 1000 Mcg/ml Soln (Cyanocobalamin) .... One Injection Im Once Monthly 9)  Sucralfate 1 Gm/53ml Susp (Sucralfate) .... Take 10cc By Mouth 4 Times Daily. 10)  Evista 60 Mg Tabs (Raloxifene Hcl) .... Hold 11)  Prilosec 40 Mg  Cpdr (Omeprazole) .Marland Kitchen.. 1 Twice A Day 30 Minutes Before Meals 12)  Carvedilol 3.125 Mg Tabs (Carvedilol) .... Hold 13)  Warfarin Sodium 2.5 Mg Tabs (Warfarin Sodium) .... One Every Evening or As Directed  Allergies (verified): 1)  ! * Statins  Anticoagulation Management History:      The patient comes in today for her initial visit for anticoagulation therapy.  Positive risk factors for bleeding include an age of 71 years or older and history of GI bleeding.  The bleeding index is 'intermediate risk'.  Positive CHADS2 values include History of HTN.  Negative CHADS2 values include Age > 71 years old.   Today's INR is 1.2.  Anticoagulation responsible Dalisa Forrer: Excell Seltzer MD, Casimiro Needle.  INR POC: 1.2.  Cuvette Lot#: [id number].    Anticoagulation Management Assessment/Plan:      The patient's current anticoagulation dose is Warfarin sodium 2.5 mg tabs: one every evening or as directed.  The target INR is 2.0-3.0.  The next INR is due 05/16/2010.  Results were reviewed/authorized by Louann Sjogren, PharmD.         Current Anticoagulation Instructions: 05/12/2010 INR 1.2  (goal INR is 2.0-3.0)  Start taking 2 tablets daily (5mg  a day) and return on Friday to check INR.

## 2010-09-23 NOTE — Progress Notes (Signed)
Summary: refill  Phone Note Call from Patient Call back at [phone]------cell   Caller: Patient Call For: Dr Juanda Chance Reason for Call: Refill Medication, Talk to Nurse Summary of Call: Patient wants to discuss refill meds (Carafate) Initial call taken by: Tawni Levy,  April 10, 2010 8:07 AM  Follow-up for Phone Call        Patient's pharmacy requested refills of carafate yesterday but I denied it due to the fact that per your office note in 01/2010, patient was to discontinue the prescription. Patient called this morning stating that she has had a flare of her GERD symptoms (which she thinks may be due to stress due to an upcoming medical procedure). She states she has been having increased cough as well as epigastric abdominal discomfort. She states that she is "not having anymore trouble swallowing than normal." She states that she had some carafate left over from June which she has been taking 10cc one-two times daily and it seems to help. She is also following antireflux measures and taking her omeprazole two times a day before meals as directed. Are you okay with me refilling her carafate once more? Follow-up by: Lamona Curl CMA Duncan Dull),  April 10, 2010 8:21 AM  Additional Follow-up for Phone Call Additional follow up Details #1::        I am OK with it. Additional Follow-up by: Hart Carwin MD,  April 10, 2010 1:05 PM    Additional Follow-up for Phone Call Additional follow up Details #2::    Prescription sent for patient. She has been advised of this. Follow-up by: Lamona Curl CMA Duncan Dull),  April 10, 2010 1:33 PM  Prescriptions: SUCRALFATE 1 GM/10ML SUSP (SUCRALFATE) Take 10cc by mouth 4 times daily.  #1200 cc x 0   Entered by:   Lamona Curl CMA (AAMA)   Authorized by:   Hart Carwin MD   Signed by:   Lamona Curl CMA (AAMA) on 04/10/2010   Method used:   Electronically to        [facility] (retail)       [address]       [place], [place]  [address]       Ph: [phone] or [phone]       Fax: [phone]   RxID:   [id number]

## 2010-09-23 NOTE — Letter (Signed)
Summary: [facility]  [facility]   Imported By: Marylou Mccoy 05/23/2010 11:51:20  _____________________________________________________________________  External Attachment:    Type:   Image     Comment:   External Document

## 2010-09-23 NOTE — Letter (Signed)
Summary: Outpatient Coinsurance Notice  Outpatient Coinsurance Notice   Imported By: Marylou Mccoy 01/14/2010 10:20:09  _____________________________________________________________________  External Attachment:    Type:   Image     Comment:   External Document

## 2010-09-23 NOTE — Letter (Signed)
Summary: [facility]  [facility]   Imported By: Marylou Mccoy 07/16/2010 14:57:13  _____________________________________________________________________  External Attachment:    Type:   Image     Comment:   External Document

## 2010-09-23 NOTE — Progress Notes (Signed)
Summary: return call  Phone Note Call from Patient   Caller: Patient Reason for Call: Talk to Nurse, Lab or Test Results Summary of Call: pt returned call to dr Emery Binz-can be reached for a return call at [phone] except between 12p and 2p/mt Initial call taken by: Glynda Jaeger,  March 10, 2010 9:37 AM  Follow-up for Phone Call        Discussed with patient.  Diagnosis is a rare form of amyloid.  She will continue to follow up with [facility].  Her BP has been low.  She will be holding her her beta blocker and ACE. Follow-up by: Rollene Rotunda, MD, [facility],  March 11, 2010 1:39 PM

## 2010-09-23 NOTE — Progress Notes (Signed)
Summary: Triage  Phone Note Call from Patient Call back at Home Phone [phone]   Caller: Patient Call For: Dr. Juanda Chance Reason for Call: Talk to Nurse Summary of Call: pt. went to her PCP and was dx being Anemic. Was told to f/u with Dr. Juanda Chance I offered an appt. but would like to discuss Initial call taken by: Karna Christmas,  February 07, 2010 8:56 AM  Follow-up for Phone Call        Message left for patient to callback. Laureen Ochs LPN  February 07, 2010 1:08 PM   Pt. states her PCP said she was slightly anemic and she needs to folow-up with Dr.Ralphine Hinks. Pt. will bring the labs with her and she will see Dr.Judah Chevere on 02-11-10 at 8:45am. Follow-up by: Laureen Ochs LPN,  February 07, 2010 2:11 PM

## 2010-09-23 NOTE — Progress Notes (Signed)
Summary: appt today, discuss consult from [place]  Phone Note Call from Patient Call back at Home Phone [phone]   Caller: Patient Reason for Call: Talk to Nurse Summary of Call: per pt calling, wants to discuss consult visit she had in [place], would like an appt today.  Initial call taken by: Lorne Skeens,  April 25, 2010 3:59 PM  Follow-up for Phone Call        Micah Flesher to [facility] - it was very helpful - Head of Program will be faxing report - (Dr Elly Modena)  Also cardiologist there Dr Rebbeca Paul [phone] phone number, if Dr Antoine Poche wants to talk to him before her follow-up appt.  Appt given for 05/02/2010 at 9:45  Follow-up by: Charolotte Capuchin, RN,  April 25, 2010 6:13 PM

## 2010-09-23 NOTE — Progress Notes (Signed)
Summary: Triage  Phone Note Call from Patient Call back at Home Phone [phone]   Caller: Patient Call For: Dr. Juanda Chance Reason for Call: Talk to Nurse Summary of Call: Wants to know if something could be prescribed for acid reflux Initial call taken by: Karna Christmas,  October 24, 2009 12:22 PM  Follow-up for Phone Call        patient c/o GERD symptoms.  She is advised to start on Pilosec OTC, she is asked to call back and schedule an appointment if this doesn't help. Follow-up by: Darcey Nora RN, CGRN,  October 24, 2009 1:46 PM

## 2010-09-23 NOTE — Medication Information (Signed)
Summary: rov/ewj  Anticoagulant Therapy  Managed by: Cloyde Reams, RN, BSN PCP: Hamilton Capri, MD Supervising MD: Jens Som MD, Arlys John Indication 1: Atrial Fibrillation Lab Used: [facility] [place] Site: [address] INR POC 2.6 INR RANGE 2.0-3.0  Dietary changes: yes       Details: Incr reflux, decr weight.      Any changes in medication regimen? yes       Details: Started on Nexium, also had a flu shot and shingles vaccine.     Comments: Wants to start to drinking  Allergies: 1)  ! * Statins  Anticoagulation Management History:      The patient is taking warfarin and comes in today for a routine follow up visit.  Positive risk factors for bleeding include an age of 71 years or older and history of GI bleeding.  The bleeding index is 'intermediate risk'.  Positive CHADS2 values include History of HTN.  Negative CHADS2 values include Age > 41 years old.  Her last INR was 1.2.  Anticoagulation responsible provider: Jens Som MD, Arlys John.  INR POC: 2.6.  Exp: 06/2011.    Anticoagulation Management Assessment/Plan:      The patient's current anticoagulation dose is Warfarin sodium 2.5 mg tabs: one every evening or as directed.  The target INR is 2.0-3.0.  The next INR is due 06/23/2010.  Anticoagulation instructions were given to patient.  Results were reviewed/authorized by Cloyde Reams, RN, BSN.  She was notified by Cloyde Reams RN.         Prior Anticoagulation Instructions: INR 3.9  Skip today's dosage of Coumadin, then start taking 1 tablet daily except 2 tablets on Tuesdays, Thursdays, and Saturdays.  Recheck in 10 days.   Current Anticoagulation Instructions: INR 2.6  Continue on same dosage 1 tablet daily except 2 tablets on Tuesdays, Thursdays, and Saturdays.  Recheck in 2 weeks.

## 2010-09-23 NOTE — Letter (Signed)
Summary: [facility] Office Note  [facility] Office Note   Imported By: Roderic Ovens 11/04/2009 15:01:06  _____________________________________________________________________  External Attachment:    Type:   Image     Comment:   External Document

## 2010-09-23 NOTE — Assessment & Plan Note (Signed)
Summary: PER CHECK OUT/SF   Visit Type:  Follow-up Primary Provider:  Hamilton Capri, MD  CC:  CAD/Cardiomyopathy.  History of Present Illness: The patient presents for followup of her cardiomyopathy. Since I last saw her she was seen at [facility] and has been diagnosed with some variant of amyloid. This explains peripheral neuropathy and weakness apparently as well as dysphonia and probably her cardiomyopathy. It actually had to stop her ACE inhibitor and beta blocker because of hypotension recently. These are on hold until she has further evaluation in [place]. She denies any chest pressure, neck or arm discomfort she doesn't have any shortness of breath. She denies PND or orthopnea. She is limited by leg weakness. She does have significant lower extremity swelling. It turns out she keeps her feet on the ground most of the day while working on her laptop computer.  Current Medications (verified): 1)  Align   Caps (Misc Intestinal Flora Regulat) .... Take 1 Tablet By Mouth Once A Day 2)  Ramipril 2.5 Mg  Caps (Ramipril) .... Hold 3)  Aspirin 81 Mg  Tbec (Aspirin) .... Take 1 Tablet By Mouth Once A Day 4)  Gabapentin 100 Mg  Caps (Gabapentin) .... Take 1-3 As Needed Per Day 5)  Caltrate 600 1500 Mg Tabs (Calcium Carbonate) .Marland Kitchen.. 1 By Mouth Daily 6)  Furosemide 20 Mg Tabs (Furosemide) .... Take 1 Tablet By Mouth Once A Day As Needed 7)  Potassium Chloride Crys Cr 10 Meq Cr-Tabs (Potassium Chloride Crys Cr) .... Take 1 Tablet By Mouth Once A Day 8)  Cyanocobalamin 1000 Mcg/ml Soln (Cyanocobalamin) .... One Injection Im Once Monthly 9)  Sucralfate 1 Gm/2ml Susp (Sucralfate) .... Take 10cc By Mouth 4 Times Daily. 10)  Evista 60 Mg Tabs (Raloxifene Hcl) .... Hold 11)  Prilosec 40 Mg  Cpdr (Omeprazole) .Marland Kitchen.. 1 Twice A Day 30 Minutes Before Meals 12)  Carvedilol 3.125 Mg Tabs (Carvedilol) .... Hold 13)  Miralax  Powd (Polyethylene Glycol 3350) .... Dissolve 17 Frmas (1 Capful) in At Least 8  Ounces Water/juice and Drink Once Daily 14)  Vitamin D 400 Unit Caps (Cholecalciferol) .Marland Kitchen.. 1 By Mouth Daily 15)  Ferretts 325 (106 Fe) Mg Tabs (Ferrous Fumarate) .Marland Kitchen.. 1 By Mouth Daily  Allergies (verified): 1)  ! * Statins  Past History:  Past Medical History: SYNCOPE (ICD-780.2) EDEMA (ICD-782.3) DIZZINESS (ICD-780.4) HEMORRHOIDS, INTERNAL (ICD-455.0) LACTOSE INTOLERANCE (ICD-271.3) DEPRESSION (ICD-311) ANXIETY (ICD-300.00) IRRITABLE BOWEL SYNDROME (ICD-564.1) DYSLIPIDEMIA (ICD-272.4) HYPERTENSION (ICD-401.9) CORONARY ARTERY DISEASE (left main 20% stenosis, the LAD luminal irregularity 40%   stenosis, the ramus intermediate 95% ostial stenosis.  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 Appendectomy Hemorrhoidectomy Carpal Tunnel release Cystoscopy and cold cup bladder biopsy and fulguration Nerve and muscle biopsy  Review of Systems       As stated in the HPI and negative for all other systems.   Vital Signs:  Patient profile:   70 year old female Height:      64 inches Weight:      133 pounds BMI:     22.91 Pulse rate:   74 / minute Resp:     16 per minute BP sitting:   116 / 62  (left arm)  Vitals Entered By: Marrion Coy, CNA (April 04, 2010 10:01 AM)  Physical Exam  General:  Frail appearing but in no distress Head:  normocephalic and atraumatic Neck:  No jugular venous distention at 90 Chest Wall:  no deformities or breast masses noted Lungs:  Clear bilaterally to auscultation and percussion. Heart:  S1 and S2 within normal limits, no S3, no S4, no clicks, no rubs, no murmurs Abdomen:  Positive bowel sounds, no rebound, no guarding, exam compromised by the patient being seated in the wheelchair Msk:   Diffuse muscle wasting Pulses:  2+ upper pulses Extremities:  moderate bilateral lower extremity edema to above the knees Neurologic:  diffuse motor weakness, cranial nerves grossly intact Cervical Nodes:  no significant adenopathy Psych:  Normal affect.   Impression & Recommendations:  Problem # 1:  CORONARY ARTERY DISEASE (ICD-414.00) For now I am going to hold off on medications though if her blood pressure allows in the future I will probably restart carvedilol first. She has had lightheadedness and presyncope with her low blood pressure.  Problem # 2:  CARDIOMYOPATHY (ICD-425.4) As above. I will wait for further records from [facility] to see if there is further therapy for her apparent amyloidosis.  Problem # 3:  EDEMA (ICD-782.3) We had a long discussion about this. She needs to find a way to keep her feet elevated during the day. She will continue compression stockings and her current dose of diuretic  Patient Instructions: 1)  Your physician recommends that you schedule a follow-up appointment in: 4 months with Dr Antoine Poche 2)  Your physician recommends that you continue on your current medications as directed. Please refer to the Current Medication list given to you today.

## 2010-09-23 NOTE — Progress Notes (Signed)
Summary: re monitor meds  order for CBC/BMP  Phone Note Call from Patient Call back at cell [phone]   Caller: Patient Reason for Call: Talk to Nurse Summary of Call: Pt states Pam was going to give her something to monitor her meds. pt is going out of town pt would like to talk to a nurse. pt will be coming in today for coumadin check at 12pm. Initial call taken by: Roe Coombs,  July 07, 2010 8:39 AM  Follow-up for Phone Call        Pt will call Pam tomorrow to get all this resolved. I will listen to her heart and lungs after her coumadin appointment per her request.  Follow-up by: Claris Gladden RN,  July 07, 2010 9:02 AM  Additional Follow-up for Phone Call Additional follow up Details #1::        assess pt lungs/ht sounds. lll diminished, heart sounds irreg. pt going home to take lasix. edematous bilaterally le, np. pt to call pam tomorrow to discuss labs while she is visiting in [place].  Additional Follow-up by: Claris Gladden RN,  July 07, 2010 12:30 PM    Additional Follow-up for Phone Call Additional follow up Details #2::    per Dr Antoine Poche, pt will need a CBC and BMP once a month.  Order will be mailed to pt Follow-up by: Charolotte Capuchin, RN,  July 08, 2010 2:08 PM

## 2010-09-23 NOTE — Progress Notes (Signed)
Summary: questions  Phone Note Call from Patient Call back at Home Phone [phone]   Caller: Patient Call For: Dr. Juanda Chance Reason for Call: Talk to Nurse Summary of Call: pt stated that she has further questions that she would like to discuss with a nurse Initial call taken by: Vallarie Mare,  April 11, 2010 9:17 AM  Follow-up for Phone Call        phone is busy I will continue to try and reach the patient Follow-up by: Darcey Nora RN, CGRN,  April 11, 2010 9:50 AM  Additional Follow-up for Phone Call Additional follow up Details #1::        Left message for patient to call back Darcey Nora RN, [facility]  April 11, 2010 10:12 AM  patient actually had questions about when her first visit was with Dr Juanda Chance and what her weight was.  All questions answered. Additional Follow-up by: Darcey Nora RN, CGRN,  April 11, 2010 11:02 AM

## 2010-09-23 NOTE — Progress Notes (Signed)
Summary: pt has questions  Phone Note Call from Patient Call back at Home Phone [phone]   Caller: Patient Reason for Call: Talk to Nurse, Talk to Doctor Summary of Call: pt wants to know what her potassium level is prior to coumadin appt. Can MD call in Rx diflunisal the medication they talked about at last converstion. Initial call taken by: Omer Jack,  June 09, 2010 9:00 AM  Follow-up for Phone Call        Pt given results of potassium drawn on June 05, 2010. Will fax these results to Dr. Otis Dials at pt's request once reviewed by Dr.Leanard Dimaio.  Pt states she would like to start Diflunisal if OK with Dr. Antoine Poche. If OK she needs prescription for this.  Pt. made aware that Dr. Antoine Poche is not in office today and that I will forward this message to him to review when back in office. Follow-up by: Dossie Arbour, RN, BSN,  June 09, 2010 11:20 AM

## 2010-09-23 NOTE — Medication Information (Signed)
Summary: rov/jm  Anticoagulant Therapy  Managed by: Lyna Poser, PharmD PCP: Hamilton Capri, MD Supervising MD: Myrtis Ser MD, Tinnie Gens Indication 1: Atrial Fibrillation Lab Used: [facility] [place] Site: [address] INR POC 3.9  Dietary changes: no    Health status changes: no    Bleeding/hemorrhagic complications: no    Recent/future hospitalizations: no    Any changes in medication regimen? no    Recent/future dental: no  Any missed doses?: no       Is patient compliant with meds? yes      Comments: started taking coumadin around the 16th  Allergies: 1)  ! * Statins  Anticoagulation Management History:      Positive risk factors for bleeding include an age of 70 years or older and history of GI bleeding.  The bleeding index is 'intermediate risk'.  Positive CHADS2 values include History of HTN.  Negative CHADS2 values include Age > 22 years old.  Her last INR was 1.2.  Anticoagulation responsible provider: Myrtis Ser MD, Tinnie Gens.  INR POC: 3.9.  Exp: 06/2011.    Anticoagulation Management Assessment/Plan:      The patient's current anticoagulation dose is Warfarin sodium 2.5 mg tabs: one every evening or as directed.  The target INR is 2.0-3.0.  The next INR is due 05/30/2010.  Anticoagulation instructions were given to patient.  Results were reviewed/authorized by Lyna Poser, PharmD.         Prior Anticoagulation Instructions: INR 1.8  Continue taking two tablets every day.  We will see you in 5 days.  Current Anticoagulation Instructions: INR 3.9 Skip today's dose. Change your monday and friday dose to 1 tablet. And 2 tablets all other days. Recheck in 10 days.

## 2010-09-25 NOTE — Medication Information (Signed)
Summary: ccr/tp  Anticoagulant Therapy  Managed by: Geoffry Paradise, PHarmD PCP: Hamilton Capri, MD Supervising MD: Gala Romney MD, Reuel Boom Indication 1: Atrial Fibrillation Lab Used: [facility] [place] Site: [address] INR POC 3 INR RANGE 2.0-3.0  Dietary changes: no    Health status changes: no    Bleeding/hemorrhagic complications: yes       Details: Blood of blood in urine started Sunday night (1/8)  Recent/future hospitalizations: no    Any changes in medication regimen? no    Recent/future dental: no  Any missed doses?: no       Is patient compliant with meds? yes       Allergies: 1)  ! * Statins  Anticoagulation Management History:      Positive risk factors for bleeding include an age of 71 years or older and history of GI bleeding.  The bleeding index is 'intermediate risk'.  Positive CHADS2 values include History of HTN.  Negative CHADS2 values include Age > 29 years old.  Her last INR was 1.2.  Anticoagulation responsible provider: Adithya Difrancesco MD, Reuel Boom.  INR POC: 3.  Cuvette Lot#: [id number].  Exp: 07/2011.    Anticoagulation Management Assessment/Plan:      The patient's current anticoagulation dose is Warfarin sodium 2.5 mg tabs: one every evening or as directed.  The target INR is 2.0-3.0.  The next INR is due 09/23/2010.  Anticoagulation instructions were given to patient.  Results were reviewed/authorized by Geoffry Paradise, PHarmD.         Prior Anticoagulation Instructions: INR 2.73  Spoke with pt.  Continue same dose of 1 tablet every day except 2 tablets on Tuesday, Thursday and Saturday.  Recheck INR in 3 weeks.   Current Anticoagulation Instructions: INR:  3  Your INR is at the higher end of goal today.  With the recent blood in urine, reduce your coumadin to 1 tablet tonight then continue previous regimen at 1 tablet daily except 2 tablet on Tuesday, Thursday, and Saturday.   Give [place] a call if blood in urine continues.  Please return  in 3 weeks for another INR check.  The patient is to hold two doses of coumadin.  The dosage to be resumed includes:

## 2010-09-25 NOTE — Medication Information (Signed)
Summary: Physician's Orders   Physician's Orders   Imported By: Roderic Ovens 08/06/2010 14:13:59  _____________________________________________________________________  External Attachment:    Type:   Image     Comment:   External Document

## 2010-09-25 NOTE — Letter (Signed)
Summary: Appointment - Missed  [facility], Main Office  [address]   [place], [place] [address]   Phone: [phone]  Fax: [phone]     September 08, 2010 MRN: [id number]   Miranda [name] [address] [place], [place]  [address]   Dear Ms. Kukla,  Our records indicate you missed your appointment on  08-08-2010  with  Dr.  Antoine Poche. It is very important that we reach you to reschedule this appointment. We look forward to participating in your health care needs. Please contact us at the number listed above at your earliest convenience to reschedule this appointment.     Sincerely,   Lorne Skeens  [facility] Scheduling Team

## 2010-09-25 NOTE — Medication Information (Signed)
Summary: Coumadin Clinic  Anticoagulant Therapy  Managed by: Weston Brass, PharmD PCP: Hamilton Capri, MD Supervising MD: Daleen Squibb MD, Maisie Fus Indication 1: Atrial Fibrillation Lab Used: [facility] [place] Site: [address] PT 29.8 INR POC 2.73 INR RANGE 2.0-3.0  Dietary changes: no    Health status changes: no    Bleeding/hemorrhagic complications: no    Recent/future hospitalizations: no    Any changes in medication regimen? yes       Details: took cipro since last time  Recent/future dental: no  Any missed doses?: no       Is patient compliant with meds? yes       Allergies: 1)  ! * Statins  Anticoagulation Management History:      Positive risk factors for bleeding include an age of 49 years or older and history of GI bleeding.  The bleeding index is 'intermediate risk'.  Positive CHADS2 values include History of HTN.  Negative CHADS2 values include Age > 61 years old.  Her last INR was 1.2.  Prothrombin time is 29.8.  Anticoagulation responsible provider: Daleen Squibb MD, Maisie Fus.  INR POC: 2.73.  Exp: 07/2011.    Anticoagulation Management Assessment/Plan:      The patient's current anticoagulation dose is Warfarin sodium 2.5 mg tabs: one every evening or as directed.  The target INR is 2.0-3.0.  The next INR is due 09/02/2010.  Anticoagulation instructions were given to patient.  Results were reviewed/authorized by Weston Brass, PharmD.  She was notified by Weston Brass PharmD.         Prior Anticoagulation Instructions: INR 2.8 Continue previous dose of 1 tablet everyday except 2 tablets on Tuesday, Thursday, and Saturday Follow up with lab in [place] in 3 weeks   Current Anticoagulation Instructions: INR 2.73  Spoke with pt.  Continue same dose of 1 tablet every day except 2 tablets on Tuesday, Thursday and Saturday.  Recheck INR in 3 weeks.

## 2010-09-25 NOTE — Progress Notes (Signed)
Summary:  orders for blood work  Phone Note Call from Patient Call back at [employer] [phone]   Caller: Patient Reason for Call: Talk to Nurse Summary of Call: pt would like to get blood work tomorrow. pt needs some orders. pt states she was given orders to get it done pt states she has not got it done and would like to come in tomorrow. Initial call taken by: Roe Coombs,  September 01, 2010 9:23 AM  Follow-up for Phone Call        Called spoke with pt.  Pt has scheduled appt for 09/02/10 at 2:45pm to have her Coumadin checked.  Pt would also like an order to be put into lab for monthly labwork Dr Antoine Poche  orders appears in last note to be CBC and BMP to have done while in office for CVRR appt.   Follow-up by: Cloyde Reams RN,  September 01, 2010 2:48 PM  Additional Follow-up for Phone Call Additional follow up Details #1::        orders placed Additional Follow-up by: Charolotte Capuchin, RN,  September 01, 2010 3:13 PM

## 2010-10-01 ENCOUNTER — Telehealth: Payer: Self-pay | Admitting: Cardiology

## 2010-10-01 NOTE — Medication Information (Signed)
Summary: ROV  Anticoagulant Therapy  Managed by: Bethena Midget, RN, BSN PCP: Hamilton Capri, MD Supervising MD: Myrtis Ser MD, Tinnie Gens Indication 1: Atrial Fibrillation Lab Used: [facility] [place] Site: [address] INR POC 2.9 INR RANGE 2.0-3.0  Dietary changes: no    Health status changes: no    Bleeding/hemorrhagic complications: yes       Details: Noticed hematuria last week.  Does have Urologist appt that she needs to schedule, past due. Will call today and will call if she starts new med.   Recent/future hospitalizations: no    Any changes in medication regimen? no    Recent/future dental: no  Any missed doses?: yes     Details: Missed 2.5mg s last week.   Is patient compliant with meds? yes       Allergies: 1)  ! * Statins  Anticoagulation Management History:      The patient is taking warfarin and comes in today for a routine follow up visit.  Positive risk factors for bleeding include an age of 3 years or older and history of GI bleeding.  The bleeding index is 'intermediate risk'.  Positive CHADS2 values include History of HTN.  Negative CHADS2 values include Age > 69 years old.  Her last INR was 1.2.  Anticoagulation responsible provider: Myrtis Ser MD, Tinnie Gens.  INR POC: 2.9.  Cuvette Lot#: [id number].  Exp: 08/2011.    Anticoagulation Management Assessment/Plan:      The patient's current anticoagulation dose is Warfarin sodium 2.5 mg tabs: one every evening or as directed.  The target INR is 2.0-3.0.  The next INR is due 10/17/2010.  Anticoagulation instructions were given to patient.  Results were reviewed/authorized by Bethena Midget, RN, BSN.  She was notified by Bethena Midget, RN, BSN.         Prior Anticoagulation Instructions: INR:  3  Your INR is at the higher end of goal today.  With the recent blood in urine, reduce your coumadin to 1 tablet tonight then continue previous regimen at 1 tablet daily except 2 tablet on Tuesday, Thursday, and Saturday.    Give [place] a call if blood in urine continues.  Please return in 3 weeks for another INR check.  The patient is to hold two doses of coumadin.  The dosage to be resumed includes:   Current Anticoagulation Instructions: INR 2.9 Continue 2.5mg s everyday except 5mg  on Tuesdays, Thursdays and Saturdays. Recheck in 3 weeks.

## 2010-10-06 ENCOUNTER — Other Ambulatory Visit: Payer: Self-pay | Admitting: Cardiology

## 2010-10-06 ENCOUNTER — Other Ambulatory Visit (INDEPENDENT_AMBULATORY_CARE_PROVIDER_SITE_OTHER): Payer: Medicare Other

## 2010-10-06 ENCOUNTER — Encounter: Payer: Self-pay | Admitting: Cardiology

## 2010-10-06 DIAGNOSIS — I428 Other cardiomyopathies: Secondary | ICD-10-CM

## 2010-10-06 DIAGNOSIS — R609 Edema, unspecified: Secondary | ICD-10-CM

## 2010-10-06 DIAGNOSIS — I498 Other specified cardiac arrhythmias: Secondary | ICD-10-CM

## 2010-10-06 DIAGNOSIS — Z7901 Long term (current) use of anticoagulants: Secondary | ICD-10-CM

## 2010-10-07 LAB — BASIC METABOLIC PANEL
Calcium: 8.8 mg/dL (ref 8.4–10.5)
GFR: 75.13 mL/min (ref >60.00)
Sodium: 139 mEq/L (ref 135–145)

## 2010-10-09 NOTE — Progress Notes (Signed)
Summary: c/o swelling in leg- lasix     PUT IN BMP FOR MONDAY  Phone Note Call from Patient Call back at Home Phone [phone]   Caller: Patient Reason for Call: Talk to Nurse Details for Reason: c/o swelling in leg for several days. - how many lasix can she take. pt currently taken 20 mg tab Initial call taken by: Lorne Skeens,  October 01, 2010 2:21 PM  Follow-up for Phone Call        Pt c/o of swelling of LE for about 10 days. Pt. states now she weights 131 lbs. Her usual weigh is 124 to 125 lbs. She denies SOB or any other symptoms. Pt. said lately her appetite has improved. Pt. would like to know whow much more Lasix can she take. She now  takes  40 mg of Lasix  in am and 20 to 40 mg in PM. Follow-up by: Ollen Gross, RN, BSN,  October 01, 2010 2:57 PM  Additional Follow-up for Phone Call Additional follow up Details #1::        Tell her to take 80 mg qam and 40 mg qpm x three days then 40 two times a day.  Ask her to increase her KCL to 40 meq daily and we need a BMET Monday.  If she wants to wait another week to see me OK if not she should come in to see Scott. Additional Follow-up by: Rollene Rotunda, MD, [facility],  October 01, 2010 5:11 PM    Additional Follow-up for Phone Call Additional follow up Details #2::    PT AWARE OF ORDERS AND WILL FOLLOW UP AS ORDERS Follow-up by: Charolotte Capuchin, RN,  October 01, 2010 5:30 PM

## 2010-10-17 ENCOUNTER — Ambulatory Visit: Payer: Self-pay | Admitting: Cardiology

## 2010-10-20 ENCOUNTER — Encounter (INDEPENDENT_AMBULATORY_CARE_PROVIDER_SITE_OTHER): Payer: Medicare Other

## 2010-10-20 ENCOUNTER — Encounter: Payer: Self-pay | Admitting: Cardiology

## 2010-10-20 DIAGNOSIS — I4891 Unspecified atrial fibrillation: Secondary | ICD-10-CM

## 2010-10-20 DIAGNOSIS — Z7901 Long term (current) use of anticoagulants: Secondary | ICD-10-CM

## 2010-10-20 LAB — CONVERTED CEMR LAB: POC INR: 3.5

## 2010-10-28 ENCOUNTER — Encounter: Payer: Self-pay | Admitting: Cardiology

## 2010-10-30 NOTE — Medication Information (Signed)
Summary: CCR  Anticoagulant Therapy  Managed by: Geoffry Paradise, PharmD PCP: Hamilton Capri, MD Supervising MD: Jens Som MD, Arlys John Indication 1: Atrial Fibrillation Lab Used: [facility] [place] Site: [address] INR POC 3.5 INR RANGE 2.0-3.0  Dietary changes: yes       Details: Eating more vegetables   Health status changes: no    Bleeding/hemorrhagic complications: no    Recent/future hospitalizations: no    Any changes in medication regimen? yes       Details: Dr. Antoine Poche in the process of adjusting lasix  Recent/future dental: no  Any missed doses?: no       Is patient compliant with meds? yes       Allergies: 1)  ! * Statins  Anticoagulation Management History:      The patient is taking warfarin and comes in today for a routine follow up visit.  Positive risk factors for bleeding include an age of 71 years or older and history of GI bleeding.  The bleeding index is 'intermediate risk'.  Positive CHADS2 values include History of HTN.  Negative CHADS2 values include Age > 71 years old.  Her last INR was 1.2.  Anticoagulation responsible provider: Jens Som MD, Arlys John.  INR POC: 3.5.  Cuvette Lot#: [id number].  Exp: 08/2011.    Anticoagulation Management Assessment/Plan:      The patient's current anticoagulation dose is Warfarin sodium 2.5 mg tabs: one every evening or as directed.  The target INR is 2.0-3.0.  The next INR is due 11/03/2010.  Anticoagulation instructions were given to patient.  Results were reviewed/authorized by Geoffry Paradise, PharmD.         Prior Anticoagulation Instructions: INR 2.9 Continue 2.5mg s everyday except 5mg  on Tuesdays, Thursdays and Saturdays. Recheck in 3 weeks.   Current Anticoagulation Instructions: INR:  3.5 (goal 2-3)  No coumadin tonight then resume your normal schedule on Tuesday night with 1 tablet everyday except 2 tablets on Tuesday, Thursday, and Saturday.   Return in 2 weeks for another INR check.

## 2010-11-03 ENCOUNTER — Encounter (INDEPENDENT_AMBULATORY_CARE_PROVIDER_SITE_OTHER): Payer: Medicare Other

## 2010-11-03 ENCOUNTER — Encounter: Payer: Self-pay | Admitting: Cardiology

## 2010-11-03 DIAGNOSIS — Z7901 Long term (current) use of anticoagulants: Secondary | ICD-10-CM

## 2010-11-03 DIAGNOSIS — I4891 Unspecified atrial fibrillation: Secondary | ICD-10-CM

## 2010-11-10 LAB — BASIC METABOLIC PANEL
BUN: 14 mg/dL (ref 6–23)
CO2: 33 mEq/L — ABNORMAL HIGH (ref 19–32)
Calcium: 8.7 mg/dL (ref 8.4–10.5)
Chloride: 99 mEq/L (ref 96–112)
Creatinine, Ser: 0.9 mg/dL (ref 0.4–1.2)
GFR calc Af Amer: >60 mL/min (ref >60)
GFR calc non Af Amer: >60 mL/min (ref >60)
Glucose, Bld: 82 mg/dL (ref 70–99)
Potassium: 4.6 mEq/L (ref 3.5–5.1)
Sodium: 138 mEq/L (ref 135–145)

## 2010-11-10 LAB — CBC
HCT: 34.4 % — ABNORMAL LOW (ref 36.0–46.0)
Hemoglobin: 11.5 g/dL — ABNORMAL LOW (ref 12.0–15.0)
MCHC: 33.4 g/dL (ref 30.0–36.0)
MCV: 93.3 fL (ref 78.0–100.0)
Platelets: 228 10*3/uL (ref 150–400)
RBC: 3.69 MIL/uL — ABNORMAL LOW (ref 3.87–5.11)
RDW: 14.4 % (ref 11.5–15.5)
WBC: 6.1 10*3/uL (ref 4.0–10.5)

## 2010-11-11 ENCOUNTER — Encounter: Payer: Self-pay | Admitting: Cardiology

## 2010-11-11 ENCOUNTER — Ambulatory Visit (INDEPENDENT_AMBULATORY_CARE_PROVIDER_SITE_OTHER): Payer: Medicare Other | Admitting: Cardiology

## 2010-11-11 VITALS — BP 98/50 | HR 64 | Wt 133.4 lb

## 2010-11-11 DIAGNOSIS — I498 Other specified cardiac arrhythmias: Secondary | ICD-10-CM

## 2010-11-11 DIAGNOSIS — I428 Other cardiomyopathies: Secondary | ICD-10-CM

## 2010-11-11 DIAGNOSIS — I4891 Unspecified atrial fibrillation: Secondary | ICD-10-CM

## 2010-11-11 DIAGNOSIS — I10 Essential (primary) hypertension: Secondary | ICD-10-CM

## 2010-11-11 DIAGNOSIS — R609 Edema, unspecified: Secondary | ICD-10-CM

## 2010-11-11 DIAGNOSIS — I429 Cardiomyopathy, unspecified: Secondary | ICD-10-CM | POA: Insufficient documentation

## 2010-11-11 MED ORDER — WARFARIN SODIUM 2.5 MG PO TABS: 2.5000 mg | ORAL_TABLET | ORAL | Status: DC

## 2010-11-11 NOTE — Medication Information (Signed)
Summary: rov/ewj  Anticoagulant Therapy  Managed by: Bethena Midget, RN, BSN PCP: Hamilton Capri, MD Supervising MD: Riley Kill MD, Maisie Fus Indication 1: Atrial Fibrillation Lab Used: [facility] [place] Site: [address] INR POC 3.4 INR RANGE 2.0-3.0  Dietary changes: no    Health status changes: no    Bleeding/hemorrhagic complications: no    Recent/future hospitalizations: no    Any changes in medication regimen? no    Recent/future dental: no  Any missed doses?: no       Is patient compliant with meds? yes       Allergies: 1)  ! * Statins  Anticoagulation Management History:      The patient is taking warfarin and comes in today for a routine follow up visit.  Positive risk factors for bleeding include an age of 71 years or older and history of GI bleeding.  The bleeding index is 'intermediate risk'.  Positive CHADS2 values include History of HTN.  Negative CHADS2 values include Age > 29 years old.  Her last INR was 1.2.  Anticoagulation responsible provider: Riley Kill MD, Maisie Fus.  INR POC: 3.4.  Cuvette Lot#: [id number].  Exp: 10/2011.    Anticoagulation Management Assessment/Plan:      The patient's current anticoagulation dose is Warfarin sodium 2.5 mg tabs: one every evening or as directed.  The target INR is 2.0-3.0.  The next INR is due 11/17/2010.  Anticoagulation instructions were given to patient.  Results were reviewed/authorized by Bethena Midget, RN, BSN.  She was notified by Bethena Midget, RN, BSN.         Prior Anticoagulation Instructions: INR:  3.5 (goal 2-3)  No coumadin tonight then resume your normal schedule on Tuesday night with 1 tablet everyday except 2 tablets on Tuesday, Thursday, and Saturday.   Return in 2 weeks for another INR check.    Current Anticoagulation Instructions: INR 3.4 Skip today then change dose to 1 pill everyday except 2 pill on Tuesdays and Saturdays.  Recheck in 2 weeks.

## 2010-11-11 NOTE — Assessment & Plan Note (Signed)
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 We are using a nonsteroidal as suggested in [place] as an off label therapy. We had a long discussion today about diuretic dosing salt fluid restriction. I will get another echocardiogram to further assess her ejection fraction. She may need to be referred for an ICD.

## 2010-11-11 NOTE — Assessment & Plan Note (Signed)
Her blood pressure is actually running low she tolerates the meds as listed. No med titration is indicated.

## 2010-11-11 NOTE — Assessment & Plan Note (Signed)
I again reviewed the EKG from [place] last year. It does appear to be atrial fibrillation. Today's EKG demonstrates a regular rhythm no P waves are difficult to discern. I suspect sinus that could be junctional. At this point I think continuing anticoagulation is most prudent that she would be high risk for stroke. She has good rate control.

## 2010-11-11 NOTE — Progress Notes (Signed)
HPI The patient presents for followup of her cardiomyopathy. 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 Evaluate [facility] and in [place]. She is not a candidate for transplant.  She is being managed off label per their recommendations with nonsteroidals. She is having no acute symptoms such as PND or orthopnea. She denied having any chest pressure, neck or arm discomfort. She is not reporting palpitations, presyncope or syncope. She does bring her blood pressure diary which I reviewed extensively and she has blood pressures often dropping into the 90s systolic. However, she doesn't really feel this. She has continued lower extremity swelling which is somewhat bothersome. However, her weights have been stable. She is anxious and depressed liver disease but very pleasant. She has had no chest pressure, neck or arm discomfort.  Allergies  Allergen Reactions  . Statins     Current Outpatient Prescriptions on File Prior to Visit  Medication Sig Dispense Refill  . diflunisal (DOLOBID) 500 MG TABS 1/2 tablet twice daily       . esomeprazole (NEXIUM) 40 MG capsule Take 40 mg by mouth daily.        . furosemide (LASIX) 40 MG tablet 2-3 tablets daily as needed       . gabapentin (NEURONTIN) 100 MG capsule 1-3 tablets daily as needed       . potassium chloride (K-DUR) 10 MEQ tablet 3 tablets daily       . Probiotic Product (ALIGN PO) Take by mouth daily.        . sucralfate (CARAFATE) 1 GM/10ML suspension as needed. Take 10 cc by mouth 4 times daily      . vitamin B-12 (CYANOCOBALAMIN) 1000 MCG tablet Inject 1,000 mcg as directed every 30 (thirty) days.        Marland Kitchen DISCONTD: warfarin (COUMADIN) 2.5 MG tablet Take by mouth as directed.        Marland Kitchen aspirin 81 MG tablet Take 81 mg by mouth daily.        . raloxifene (EVISTA) 60 MG tablet Take 60 mg by mouth daily.          Past Medical History  Diagnosis Date  . Syncope   . Edema   . Dizziness   . Hemorrhoids, internal   . Lactose intolerance   . Depression     . Anxiety   . IBS (irritable bowel syndrome)   . Dyslipidemia   . Hypertension   . Coronary artery disease   . Cardiomyopathy     30%  . Constipation   . Amyloid disease     Past Surgical History  Procedure Date  . Appendectomy   . Hemorrhoid surgery   . Carpal tunnel release   . Cystoscopy     ROS Fatigue.  Otherwise as stated in the HPI and negative for all other systems.  BP 98/50  Pulse 64  Wt 133 lb 6.4 oz (60.51 kg)  PHYSICAL EXAM GEN:  Frail appearing. NECK:  No jugular venous distention at 90 degrees, waveform within normal limits, carotid upstroke brisk and symmetric, no bruits, no thyromegaly LYMPHATICS:  No cervical, axillary adenopathy LUNGS:  Clear to auscultation bilaterally BACK:  No CVA tenderness CHEST:  Unremarkable HEART:  S1 and S2 within normal limits, no S3, no S4, no clicks, no rubs, no murmurs ABD:  Flat, positive bowel sounds normal in frequency in pitch, no bruits, no rebound, no guarding, unable to assess midline mass or bruit with the patient seated. EXT:  2 plus pulses throughout, moderate edema, no  cyanosis no clubbing SKIN:  No rashes no nodules NEURO:  Cranial nerves II through XII grossly intact, motor grossly intact throughout PSYCH:  Cognitively intact, oriented to person place and time  ASSESSMENT:

## 2010-11-11 NOTE — Assessment & Plan Note (Signed)
We will continue with conservative therapy p.r.n. dosing of her diuretic. Because of increased edema today I will have her take 60 mg 3 times a day Lasix. I will check a basic metabolic profile.

## 2010-11-11 NOTE — Patient Instructions (Signed)
You will be scheduled for a 2 D Echo for cardiomyopathy You need lab work today BMP Your coumadin had been refilled and sent into [facility] [address] For the next 3 days - please take Lasix 60 mg a day and 30 mEq of potassium then return to normal dose.

## 2010-11-12 LAB — BASIC METABOLIC PANEL
Chloride: 106 mEq/L (ref 96–112)
Creatinine, Ser: 0.7 mg/dL (ref 0.4–1.2)
Sodium: 138 mEq/L (ref 135–145)

## 2010-11-17 ENCOUNTER — Ambulatory Visit (INDEPENDENT_AMBULATORY_CARE_PROVIDER_SITE_OTHER): Payer: Medicare Other | Admitting: *Deleted

## 2010-11-17 DIAGNOSIS — Z7901 Long term (current) use of anticoagulants: Secondary | ICD-10-CM

## 2010-11-17 DIAGNOSIS — I498 Other specified cardiac arrhythmias: Secondary | ICD-10-CM

## 2010-11-17 LAB — POCT INR: INR: 2.4

## 2010-11-17 NOTE — Patient Instructions (Signed)
INR 2.4 Continue current regimen of 1 tablet (2.5 mg) daily except for 2 tablets (5 mg) on Tuesdays and Saturdays only.

## 2010-11-21 ENCOUNTER — Other Ambulatory Visit (HOSPITAL_COMMUNITY): Payer: Self-pay

## 2010-11-24 ENCOUNTER — Ambulatory Visit (HOSPITAL_COMMUNITY): Payer: Medicare Other | Attending: Cardiology

## 2010-11-24 DIAGNOSIS — I429 Cardiomyopathy, unspecified: Secondary | ICD-10-CM

## 2010-11-24 DIAGNOSIS — I428 Other cardiomyopathies: Secondary | ICD-10-CM | POA: Insufficient documentation

## 2010-12-06 ENCOUNTER — Other Ambulatory Visit: Payer: Self-pay | Admitting: *Deleted

## 2010-12-06 MED ORDER — FUROSEMIDE 40 MG PO TABS: 80.0000 mg | ORAL_TABLET | Freq: Every day | ORAL | Status: DC | PRN

## 2010-12-06 MED ORDER — POTASSIUM CHLORIDE ER 10 MEQ PO TBCR: EXTENDED_RELEASE_TABLET | ORAL | Status: DC

## 2010-12-08 ENCOUNTER — Ambulatory Visit (INDEPENDENT_AMBULATORY_CARE_PROVIDER_SITE_OTHER): Payer: Medicare Other | Admitting: *Deleted

## 2010-12-08 DIAGNOSIS — I498 Other specified cardiac arrhythmias: Secondary | ICD-10-CM

## 2010-12-08 DIAGNOSIS — Z7901 Long term (current) use of anticoagulants: Secondary | ICD-10-CM

## 2010-12-08 LAB — POCT INR: INR: 2.7

## 2010-12-08 NOTE — Patient Instructions (Signed)
Continue current regimen of 1 tablet (2.5 mg) daily EXCEPT for 2 tablets (5 mg) on Tuesdays and Saturdays. INR 2.7  Return in 4 weeks

## 2010-12-11 ENCOUNTER — Telehealth: Payer: Self-pay | Admitting: Cardiology

## 2010-12-11 DIAGNOSIS — I428 Other cardiomyopathies: Secondary | ICD-10-CM

## 2010-12-11 NOTE — Telephone Encounter (Signed)
Pt calling re echo/blood work results

## 2010-12-11 NOTE — Telephone Encounter (Signed)
Pt aware of lab results, will call her back with 2 D Echo results

## 2010-12-12 NOTE — Telephone Encounter (Signed)
Pt aware of both results and echo results

## 2010-12-18 ENCOUNTER — Other Ambulatory Visit (INDEPENDENT_AMBULATORY_CARE_PROVIDER_SITE_OTHER): Payer: Medicare Other | Admitting: *Deleted

## 2010-12-18 DIAGNOSIS — R0989 Other specified symptoms and signs involving the circulatory and respiratory systems: Secondary | ICD-10-CM

## 2011-01-05 ENCOUNTER — Ambulatory Visit (INDEPENDENT_AMBULATORY_CARE_PROVIDER_SITE_OTHER): Payer: Medicare Other | Admitting: *Deleted

## 2011-01-05 DIAGNOSIS — I498 Other specified cardiac arrhythmias: Secondary | ICD-10-CM

## 2011-01-05 DIAGNOSIS — Z7901 Long term (current) use of anticoagulants: Secondary | ICD-10-CM

## 2011-01-05 LAB — POCT INR: INR: 2.6

## 2011-01-06 NOTE — Assessment & Plan Note (Signed)
[facility] HEALTHCARE                            CARDIOLOGY OFFICE NOTE   Miranda [name]                       MRN:          [id number]  DATE:05/16/2008                            DOB:          02/20/1941    PRIMARY CARE PHYSICIAN:  Dr. Hamilton [name], [facility].   REASON FOR PRESENTATION:  Evaluate the patient with cardiomyopathy and  recent problems with hypotension.   HISTORY OF PRESENT ILLNESS:  The patient presents for followup of the  above.  At the last appointment, I did encourage her to wear her  compression stockings, which she is not doing.  She states she has much  less swelling with this.  She has had to back off on her Coreg and is  now taking 6.25 in the morning and 9.375 in the evening.  She was up to  9.375 and 12.5.  She is still getting some low blood pressures and  having some lightheaded episodes occasionally when she stands up;  however, none of this is as severe.  She has had no presyncope or  syncope.  She has balance problems and some dizziness and is seeing  Neurologist and also an ENT.  She has had no chest pressure, neck or arm  discomfort.  She has had no new shortness of breath.   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   ALLERGIES AND INTOLERANCES:  STATINS.   MEDICATIONS:  1. Ramipril 2.5 nightly.  2. Aspirin 81 mg daily.  3. Gabapentin 100 mg b.i.d.  4. Fish oil.  5. Calcium.  6. Tamoxifen.  7. Coreg 6.25 mg every morning and 9.75 mg every evening.  8. Lasix (she is taking this p.r.n. and is on a reduced dose).  9. Klor-Con taken with the Lasix.   REVIEW OF SYSTEMS:  As stated in the HPI and otherwise negative for all  other systems.   PHYSICAL EXAMINATION:  GENERAL:   The patient is in no distress.  VITAL SIGNS:  Blood pressure 96/55, heart rate 94 and regular, weight  132 pounds, and body mass index 24.  HEENT:  Otherwise unremarkable; pupils equal, round, and reactive to  light; fundi are nonvisualized; oral mucosa unremarkable.  NECK:  No jugular venous distension at 45 degrees; carotid upstroke  brisk and symmetric; no bruits, no thyromegaly.  LUNGS:  Clear to auscultation bilaterally.  BACK:  No costovertebral angle tenderness.  CHEST:  Unremarkable.  HEART:  PMI not displaced or sustained; S1 and S2 within normal limits;  no S3, no S4; no clicks, no rubs, no murmurs.  ABDOMEN:  Flat; positive bowel sounds, normal in frequency and pitch; no  bruits, no rebound, no guarding; no midline pulsatile mass; no  organomegaly.  EXTREMITIES:  2+ pulses, mild edema; otherwise, no cyanosis or clubbing.  NEURO:  Grossly intact.   ASSESSMENT AND PLAN:  1. Syncope.  The patient has had no further lightheaded episodes or  syncopal spells.  She is maintaining the blood pressure as listed.      If it gets lower, we will back off further on her beta-blocker.      For now, she will continue the meds and the compression stockings.  2. Cardiomyopathy, as above.  3. Followup.  I will see her back in about 6 months or sooner if      needed.     Miranda Rotunda, MD, [facility]  Electronically Signed    JH/MedQ  DD: 05/16/2008  DT: 05/17/2008  Job #: [id number]   cc:   Dr. Hamilton [name]

## 2011-01-06 NOTE — Assessment & Plan Note (Signed)
[facility] HEALTHCARE                            CARDIOLOGY OFFICE NOTE   ERIS, HANNAN                       MRN:          [id number]  DATE:08/02/2007                            DOB:          12/17/1940    PRIMARY:  Dr. Nicholaus Corolla.   REASON FOR PRESENTATION:  Evaluate patient with cardiomyopathy.   HISTORY OF PRESENT ILLNESS:  The patient is 70 years old.  At the last  visit I did not make any changes because she was complaining of  dizziness.  She does now think that this is much better as she stopped  hyoscyamine, thinks that was the cause of her dizziness.  She will  occasionally get some mild orthostatic symptoms when she stands up or if  she has been standing for awhile.  She has not had any presyncope or  syncope, however.  She has been to the pain clinic at [facility], had an  epidural injection for pain and numbness in her legs.  She does not  think this has yet taken effect.  She has been told she has lumbar  spinal stenosis.  She has not been having any shortness of breath.  Denies any PND or orthopnea.  She has not been having any palpitations.   PAST MEDICAL HISTORY:  1. Coronary artery disease.  Acute dyspnea with pulmonary edema      earlier this year with an EF of 40-50% while in [place].  The EF      was 40% in our office.  She had nonobstructive coronary artery      disease at the time with left main 20% stenosis, LAD luminal      irregularities, OM 40% stenosis, ramus intermediate 95% ostial      stenosis.  The right coronary artery was dominant.  She had 40%      proximal stenosis and 25% mid stenosis.  2. Hypertension.  3. Dyslipidemia x15 years.  4. Irritable bowel syndrome.  5. Appendectomy.  6. Hemorrhoidectomy.  7. Carpal tunnel surgery.   ALLERGIES:  Has been intolerant of statins.   MEDICATIONS:  1. Ramipril 2.5 mg q.h.s.  2. Coreg 6.25 mg b.i.d.  3. Aspirin 81 mg daily.  4. Gabapentin 100 mg b.i.d.  5. Fish oil  b.i.d.   REVIEW OF SYSTEMS:  As stated in the HPI and otherwise negative for  other systems.   PHYSICAL EXAMINATION:  The patient is in no distress.  Blood pressure 150/76, heart rate 78 and regular, weight 136 pounds.  HEENT:  Eyelids unremarkable.  Pupils equal, round, and reactive to  light.  Fundi not visualized.  Oral mucosa unremarkable.  NECK:  No jugular venous distention.  Waveform within normal limits.  Carotid upstroke brisk and symmetric.  No bruits, no thyromegaly.  LYMPHATIC:  No cervical, axillary or inguinal adenopathy.  LUNGS:  Clear to auscultation bilaterally.  BACK:  No costovertebral angle tenderness.  CHEST:  Unremarkable.  HEART:  PMI not displaced or sustained, S1 and S2 within normal limits,  no S3, no S4, no clicks, no rubs, no murmurs.  ABDOMEN:  Flat,  positive bowel sounds, normal in frequency in pitch.  No  bruits, no rebound, no guarding, no midline pulsatile mass, no  hepatomegaly, no splenomegaly.  SKIN:  No rash, no nodules.  EXTREMITIES:  2+ pulses, no edema, no cyanosis, no clubbing.  NEUROLOGIC:  Oriented to person, place and time, cranial nerves II-XII  grossly intact, motor grossly intact.   EKG:  Sinus rhythm, rate 70, left axis, left anterior fascicular block,  poor anterior R-wave progression, QT mildly prolonged.   ASSESSMENT AND PLAN:  1. Cardiomyopathy.  The patient has a nonischemic cardiomyopathy.      Today I will try to titrate her Coreg to 9.375 mg b.i.d.  I think      she will be able to tolerate this.  She will let me know if she has      any issues with this.  The next step will probably be to try to go      up on her ACE inhibitor.  2. Hypertension.  This will be managed in the context of treating her      cardiomyopathy.  3. Nonobstructive coronary disease.  She will continue with risk      reduction.  4. Follow-up.  I will see her back in about 4 weeks for the next      medication titration.     Rollene Rotunda, MD,  [facility]  Electronically Signed    JH/MedQ  DD: 08/02/2007  DT: 08/02/2007  Job #: [id number]   cc:   Nicholaus Corolla, MD

## 2011-01-06 NOTE — Assessment & Plan Note (Signed)
[facility] HEALTHCARE                            CARDIOLOGY OFFICE NOTE   Miranda [name], Miranda [name]                       MRN:          [id number]  DATE:01/11/2007                            DOB:          October 14, 1940    PRIMARY:  Dr. Nicholaus Corolla   REASON FOR PRESENTATION:  The patient with cardiomyopathy.   HISTORY OF PRESENT ILLNESS:  The patient is a 70 year old with coronary  disease and mildly reduced ejection fraction as described below.  At the  last visit I tried to increase her Coreg to 12.5 mg b.i.d.; however, she  called up with dizziness and lightheadedness.  We reduced her back down  to 6.25 mg b.i.d. She was hypotensive at home.  However, when she  reduced back down to 6.25 she still had the dizziness.  She felt her  legs being weak.  She describes it more as difficulty with her gait.  She is not describing orthostatic symptoms.  She is not describing  palpitations.  She did not have any presyncope or syncope.  She is not  describing vertigo.   We had a long discussion in the office about other issues as well.  She  has some fleeting chest pains.  None of this is similar to what she had  in [place].  She has no classic symptoms such as chest pressure, neck or  arm discomfort.  She has no associated nausea and vomiting or  diaphoresis with her discomfort.  These pains are sharp and fleeting and  intermittent.  She cannot bring them on.  However, she does have some  anxiety related to these.   PAST MEDICAL HISTORY:  Coronary artery disease (acute dyspnea with  pulmonary edema while in [place] earlier this year, EF 40% or 50%.  Echocardiogram in our office April 2008 demonstrated the EF to be  approximately 40%.)  Non-obstructive coronary disease (left main 20%  stenosed, LAD luminal irregularities, OM 40% stenosed, Ramus  intermediate ostial 95% stenosed, the right coronary artery is dominant  with 40% proximal stenosed and 25% mid stenosis.  The  EF was 35% on  cath.)  Hypertension, hyperlipidemia x15 years, irritable bowel  syndrome, appendectomy, hemorrhoidectomy, carpal tunnel surgery.   ALLERGIES:  She has been intolerant to STATINS.   MEDICATIONS:  1. Zyrtec 10 mg daily.  2. Align.  3. Ramipril 2.5 mg nightly.  4. Aspirin 325 mg daily.  5. Hyoscyamine.  6. Pravastatin 20 mg nightly.  7. Coreg 6.25 mg b.i.d.  8. Menostar patch.   REVIEW OF SYSTEMS:  As stated in the HPI.  Positive for problems with  irritable bowel. Negative for other systems.   PHYSICAL EXAMINATION:  The patient is in no distress.  Blood pressure 148/82, heart rate 80 and regular, weight 138 pounds,  body mass index 24.  HEENT:  Eyes unremarkable.  Pupils are equal, round and react to light,  fundi not visualized.  Oral mucosa unremarkable.  NECK:  No jugular venous distention at 45 degrees.  Carotid upstroke  brisk and symmetric, no bruits, no thyromegaly.  LYMPHATICS:  No cervical, axillary, inguinal  adenopathy.  LUNGS:  Clear to auscultation bilaterally, good.  BACK:  No costovertebral angle tenderness.  CHEST:  Unremarkable.  HEART:  PMI not displaced or sustained.  S1 and S2 within normal limits.  No S3, no S4.  No clicks, no rubs, no murmurs.  ABDOMEN:  Flat, positive bowel sounds, normal in frequency, pitch, no  bruits, no rebound, no guarding, no midline pulsatile mass.  No  hepatosplenomegaly, splenomegaly.  SKIN:  No rashes.  No nodules.  EXTREMITIES:  2+ pulses, mild bilateral lower extremity edema.  NEURO:  Oriented to person, place and time.  Cranial nerves II through  XII grossly intact, motor grossly intact.   EKG:  Sinus rhythm, rate 80, left axis deviation.  Left anterior  fascicular block, poor anterior R wave progression.   ASSESSMENT AND PLAN:  1. Dizziness.  I am not clear the patient's dizziness is really      dizziness rather than some ataxia.  She is going to follow with Dr.      Mena Pauls.  She wants to discuss  whether Hyoscyamine could be      causing this. I am not going to up-titrate her medications while      this evaluation and these symptoms are ongoing.  She may need a      neuro evaluation in the future.  She is unable to do heel to toe      walking.  I will defer to her primary care doctor.  2. Chest discomfort.  The patient is having some atypical chest      discomfort.  I did give her subinguinal nitroglycerin.  At this      point I would not suggest further cardiovascular testing.  3. Mildly reduced ejection fraction as above.  She will remain on the      medications as listed.  4. Anxiety/depression.  The patient may have some component of this.      I have asked her to schedule an appointment with Dr. Mena Pauls      specifically to discuss this topic to see whether she thinks      therapy is warranted.   MEDICATIONS:  1. The patient wanted to discuss estrogen replacement.  We discussed      the pluses and minuses of this and the patient will weigh this but      plans to continue the estrogen at this point.  2. Dyslipidemia per her primary care physician.  The goal would be an      LDL less than 100 and HDL greater than 50.  Currently she is      tolerating Pravastatin.  3. Followup.  I will see her back in 3 months or sooner if she has any      issues  (greater than 1/2 hour at this point.)     Rollene Rotunda, MD, [facility]  Electronically Signed    JH/MedQ  DD: 01/11/2007  DT: 01/11/2007  Job #: [id number]   cc:   Dr. Coralee Pesa

## 2011-01-06 NOTE — Assessment & Plan Note (Signed)
[facility] HEALTHCARE                            CARDIOLOGY OFFICE NOTE   Miranda, [name]                       MRN:          [id number]  DATE:12/21/2007                            DOB:          Sep 13, 1940    PRIMARY CARE PHYSICIAN:  Tilford Pillar, MD   REASON FOR PRESENTATION:  Evaluate patient with cardiomyopathy.   HISTORY OF PRESENT ILLNESS:  The patient is a 70 year old who presents  for 54-month followup.  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  This may  explain some of her leg pain and inability to exercise.  She is going to  get a spinal injection there soon.  She has had no new cardiovascular  complaints.  She still gets lower extremity swelling and periodically  takes Lasix and potassium.  She seems to be able to keep this relatively  limited by that.  She has no shortness of breath.  She denies any PND or  orthopnea.  She has not been having any palpitations, pre-syncope or  syncope.  She denies any chest discomfort.   PAST MEDICAL HISTORY:  1. Coronary artery disease (see the September 26, 2007 note for      details).  2. Mildly reduced ejection fraction (EF 40%).  3. Hypertension.  4. Dyslipidemia x 15 years.  5. Irritable bowel syndrome.  6. Appendectomy.  7. Hemorrhoidectomy.  8. Carpal tunnel surgery.   ALLERGIES:  THE PATIENT HAS BEEN INTOLERANT OF STATINS.   MEDICATIONS:  1. Ramipril 2.5 mg nightly.  2. Aspirin 81 mg daily.  3. Gabapentin.  4. Fish oil.  5. Coreg 9.275 mg b.i.d.  6. Calcium with vitamin D.   REVIEW OF SYSTEMS:  As stated in the HPI otherwise negative for other  systems.   PHYSICAL EXAMINATION:  The patient is in no distress.  Blood pressure  146/83, heart rate 78 and regular, weight 139 pounds, body mass index  24.  HEENT:  Eyelids are unremarkable.  Pupils equal, round, reactive to  light. Fundi not visualized.  NECK:  No jugular  venous distention at 45 degrees.  Carotid upstroke  brisk and symmetrical.  No bruits or thyromegaly.  LYMPHATICS:  No adenopathy.  LUNGS:  Clear to auscultation bilaterally.  BACK:  No costovertebral angle tenderness.  CHEST:  Unremarkable.  HEART:  PMI not displaced or sustained, S1-S2 within normal.  No S3, no  S4, 2/6 apical systolic murmur radiating slightly out the aortic outflow  tract, no diastolic murmurs.  ABDOMEN:  Flat, positive bowel sounds, normal frequency and pitch, no  bruits, rebound, guarding or midline pulsatile mass,  and no  organomegaly.  SKIN:  No rashes. No nodules.  EXTREMITIES:  2+ pulses throughout, moderate bilateral lower extremity  edema. No cyanosis or clubbing.  NEURO:  Grossly intact throughout.   ASSESSMENT/PLAN:  1. Cardiomyopathy.  The patient is having no new symptoms related to      this.  No further cardiovascular testing is suggested.  She will      continue with the meds  as listed.  She does consent to trying to go      up to 12.5 mg carvedilol at night.  We are going very slowly as she      does have lightheadedness with med changes.  2. Leg pain.  She is now having this followed at [facility].  There seems to      be at least a partial explanation.  She is due for spinal      injection.  Certainly, has some lower extremity swelling.  We again      discussed conservative measures to keep this elevated including the      p.r.n. Lasix which she has not taken in a few days.  She wears      compression stockings occasionally, but has  trouble getting them      off.  3. Hypertension.  Blood pressures being managed in the context of      treating her cardiomyopathy as above we are moving slowly.  4. Followup.  I will see her back in about 4 months or sooner if      needed.     Rollene Rotunda, MD, [facility]  Electronically Signed    JH/MedQ  DD: 12/21/2007  DT: 12/21/2007  Job #: [id number]   cc:   Tilford Pillar, MD

## 2011-01-06 NOTE — Op Note (Signed)
NAME:  Miranda [name], Miranda [name]              ACCOUNT NO.:  [id number]   MEDICAL RECORD NO.:  [id number]          PATIENT TYPE:  AMB   LOCATION:  [facility]                         FACILITY:  [facility]   PHYSICIAN:  Maretta Bees. Vonita Moss, M.D.DATE OF BIRTH:  1941-06-09   DATE OF PROCEDURE:  02/21/2007  DATE OF DISCHARGE:                               OPERATIVE REPORT   PREOPERATIVE DIAGNOSIS:  Recurrent hematuria, rule out carcinoma in  situ.   POSTOPERATIVE DIAGNOSIS:  Recurrent hematuria, rule out carcinoma in  situ.   PROCEDURE:  Cystoscopy and cold cup bladder biopsy and fulguration.   SURGEON:  Dr. Larey Dresser.   ANESTHESIA:  General.   INDICATIONS:  This lady has had intermittent gross hematuria and has  been worked up with CT scan, cystoscopy, cytologies and FISH testing.  She has persistent hemorrhagic areas in the bladder that need biopsied.  She is brought to the OR for that reason.   PROCEDURE:  The patient is brought to the operating room and placed in  the lithotomy position.  External genitalia were prepped and draped in  the usual fashion.  She had no stones or papillary lesions, but she had  an erythematous area in the mid trigone and on the right bladder wall  there were several erythematous areas with some small round capillary-  like blood filled lesions.  I biopsied the trigone with the cold cup  bladder biopsy forceps and biopsied four different hemorrhagic areas on  the right bladder wall and then used the Bugbee electrode and fulgurated  the area that I biopsied on the trigone, plus I fulgurated the four  biopsy areas plus three or four other hemorrhagic areas.  At this point,  there was good hemostasis, essentially no blood loss and bladder was  emptied, scope removed and the patient sent to the recovery room in good  condition, having tolerated the procedure well      Maretta Bees. Vonita Moss, M.D.  Electronically Signed     LJP/MEDQ  D:  02/21/2007  T:  02/21/2007  Job:   [id number]   cc:   Rollene Rotunda, MD, [facility]  [address]  [address]  [place]  [place] [address]

## 2011-01-06 NOTE — Assessment & Plan Note (Signed)
[facility] HEALTHCARE                            CARDIOLOGY OFFICE NOTE   JULIENE, KIRSH                       MRN:          [id number]  DATE:02/16/2008                            DOB:          [birth year]    PRIMARY CARE PHYSICIAN:  Dr. Caprice Renshaw, [facility].   REASON FOR PRESENTATION:  Evaluate the patient with a recent syncopal  episode.   HISTORY OF PRESENT ILLNESS:  The patient is a pleasant 70 year old with  cardiomyopathy.  Recently, she had an episode of syncope.  This happened  when she woke up at 4:00 a.m. to use the bathroom.  That night, she had  actually forgotten to take her Coreg, so she took it along with her  ramipril and also her tramadol.  She got up, she went to use the  bathroom.  She does not remember falling.  She did wake up on the floor.  She was unable to get up and use the bathroom and get back to bed.  There were no palpitations.  There was no presyncope or syncope.  There  was no chest discomfort, neck, or arm discomfort.  She has had no  recurrence of this since then.  She has had no new shortness of breath.  She denies any PND or orthopnea.  She continues to have lower extremity  swelling that has been about the same.  She does wear compression  stockings at times and may feel better, but she has not been able to  wear these and she cannot give them off.  She is wearing a TENS unit,  which helps some back pain.  She has some neuropathic lower extremity  pain but does not take much in the way of this for treatment of this.  She is seeing a neurologist [facility].   PAST MEDICAL HISTORY:  1. 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  2. Hypertension.  3. Dyslipidemia.  4. Irritable bowel syndrome.  5. Chronic lower extremity swelling.  6. Appendectomy.  7. Hemorrhoidectomy.  8. Carpal tunnel surgery.   ALLERGIES:  The patient has been  intolerant to STATINS.   MEDICATIONS:  1. Align.  2. Ramipril 2.5 mg at bedtime.  3. Aspirin 81 mg daily.  4. Gabapentin 100 mg b.i.d. p.r.n.  5. Fish oil.  6. Calcium.  7. Coreg 9.375 mg q.a.m. and 12.5 mg q.p.m.   REVIEW OF SYSTEMS:  As stated in the HPI and otherwise negative for  other systems.   PHYSICAL EXAMINATION:  GENERAL:  The patient is in no distress.  VITAL SIGNS:  Blood pressure 132/70, heart rate 70 regular, weight 132  pounds, and body mass index 24.  HEENT:  Eyes lids are unremarkable; pupils equal, round, and reactive to  light; fundi not visualized; oral mucosa unremarkable.  NECK:  No  jugular venous distention at 45 degrees.  Carotid upstroke brisk and  symmetrical.  No bruits, no thyromegaly.  LYMPHATICS:  No cervical,  axillary, or inguinal adenopathy.  LUNGS:  Clear  to auscultation bilaterally.  BACK:  No costovertebral angle tenderness.  CHEST:  Unremarkable.  HEART:  PMI not displaced or sustained, S1 and S2 within normal limits.  No S3, no S4, no clicks, no rubs, and no murmurs.  ABDOMEN:  Flat,  positive bowel sounds normal in frequency pitch, no bruits, no rebound  or no guarding or no midline pulsatile mass, no organomegaly.  SKIN:  No rashes, no nodules.  EXTREMITIES:  2+ pulses, moderate bilateral lower extremity edema  unchanged, no cyanosis, or no clubbing.  NEURO:  Grossly intact.   EKG sinus rhythm, rate 70, low voltage in the limb leads, poor anterior  R-wave progression, no acute ST-T wave changes.   ASSESSMENT AND PLAN:  1. Syncope.  The patient's syncope was most likely related to a blood      pressure drop.  She was a little more hypotensive at home after      this and recorded blood pressures in the 100 systolic range.  Her      blood pressure is not typically that low.  At this point, I am      going to write prescriptions and gave her prescription for      compression stockings as I think this will help with the swelling      and  blood pressure drops.  I am not going to change her medicines      as she is no longer having symptoms.  If she continues to have this      problem, she will let me know.  2. Cardiomyopathy.  Again, I will not titrate her medications because      of the above.  I would like to slowly do this over time if I get      the opportunity.  3. Hypertension.  Again, high blood pressures have not been the issue      here recently.  We will manage this in the context of treating her      cardiomyopathy.  4. Followup.  I would like to see her back in about 3 months or sooner      if needed.     Rollene Rotunda, MD, [facility]  Electronically Signed    JH/MedQ  DD: 02/16/2008  DT: 02/17/2008  Job #: [id number]   cc:   Lennie Hummer Dr. Caprice Renshaw

## 2011-01-06 NOTE — Assessment & Plan Note (Signed)
[facility] HEALTHCARE                            CARDIOLOGY OFFICE NOTE   Miranda [name], Miranda [name]                       MRN:          [id number]  DATE:09/26/2007                            DOB:          06/22/1941    PRIMARY:  Dr. Nicholaus Corolla.   REASON FOR PRESENTATION:  Patient with cardiomyopathy.   HISTORY OF PRESENT ILLNESS:  The patient is a 70 year old who presents  for follow-up.  Since I last saw her, she has been referred to [facility] and  has been seen in the pain clinic for spinal stenosis.  She has had a  couple of injections which she thinks may have helped a little bit.  She  is due to see a neurologist soon.  She gets numbness and pain in her  legs.  She has bilateral lower extremity swelling which is somewhat  worse.  She thinks she has been on her legs more however.  She has been  trying to do exercising.  She does 4 to 5 days a week of activity with  her arms or with a New Step.  She has been slowly increasing this but  she thinks that she still does not have the exercise tolerance that she  would hope to have.  She is not describing any new shortness of breath  and is not having any PND or orthopnea.  She is not having any chest  pressure, neck or arm discomfort.  She is not having any palpitations,  presyncope or syncope.  She is limited by leg fatigue and hip discomfort  for the most part.   PAST MEDICAL HISTORY:  Coronary artery disease (acute dyspnea with  pulmonary edema in 2008 with an EF of 40-50% while in [place].  EF is  40% in our office.  She had nonobstructive coronary disease at the time  with the left main 20% stenosis, the LAD luminal irregularity 40%  stenosis, the ramus intermediate 95% ostial stenosis.  The right  coronary artery is dominant and had 40% proximal, 25% mid stenosis.  She  was treated medically.  Hypertension, dyslipidemia x15 years, irritable  bowel syndrome, appendectomy, hemorrhoidectomy, carpal tunnel  surgery.   ALLERGIES:  Patient has been intolerant of STATINS.   MEDICATIONS:  1. Ramipril 2.5 mg daily  2. Aspirin 81 mg daily  3. Gabapentin 100 mg b.i.d.  4. Fish oil  5. Coreg 9.275 mg b.i.d.   REVIEW OF SYSTEMS:  As stated in the HPI, otherwise negative for other  systems.   PHYSICAL EXAMINATION:  The patient is in no distress.  Blood pressure  118/74, heart 72 and regular, weight 137 pounds, body mass index 24.  HEENT:  Eyes unremarkable, pupils equal, round and reactive to light,  fundi not visualized, oral mucosa unremarkable.  NECK:  No jugular venous distention at 45 degrees, carotid upstroke  brisk and symmetrical.  No bruits, no thyromegaly.  LYMPHATICS no cervical, axillary, inguinal adenopathy.  LUNGS:  Clear to auscultation bilaterally.  BACK:  No costovertebral angle tenderness.  CHEST:  Unremarkable.  HEART:  PMI not displaced or sustained, S1-S2 within  normal limits, no  S3, S4, no clicks, rubs, 2/6 apical systolic murmur radiating slightly  out the aortic outflow tract and slightly to the carotids, no diastolic  murmurs.  ABDOMEN:  Flat, positive bowel sounds, normal in frequency and pitch, no  bruits, no rebound, guarding or midline pulsatile mass or organomegaly.  SKIN:  No rashes  EXTREMITIES:  2+ pulses throughout, moderate bilateral lower extremity  edema, no cyanosis or clubbing.  NEURO:  Oriented to person, place, time, cranial nerves II-XII grossly  intact, motor grossly intact.   ASSESSMENT/PLAN:  Cardiomyopathy.  The patient does have nonobstructive  coronary disease and a cardiomyopathy.  She is not complaining of any  dyspnea.  However, she has more lower extremity swelling than she used  to.  I am going to add Lasix 20 mg, potassium 10 mEq daily for the next  several days.  She can then start taking this p.r.n.Marland Kitchen  I will not  titrate her other meds at this point but we will do this going forward.  1. Leg pain.  She has reasonable pulses.  This  is thought to be      related to spinal stenosis and she is due to see the neurologist at      [facility].  We will see if they have any suggestions for this.  This may      be what is limiting her and her ability to increase her exercise      level.  I will wait to hear their results.  2. Hypertension.  Blood pressure is being managed in the context of      treating her cardiomyopathy.  3. Dyslipidemia per Dr. Coralee Pesa.  She needs primary prevention.  She      has been intolerant of statins unfortunately.  She is taking fish      oil and linoleic acid.  4. Irritable bowel syndrome per her primary care doctor.  5. Followup.  I will see her back in about 4 months or sooner if      needed.  There is a J, indicating.  Cc Dr. which year [facility]     Rollene Rotunda, MD, [facility]  Electronically Signed    JH/MedQ  DD: 09/26/2007  DT: 09/26/2007  Job #: [id number]   cc:   Dr. Nicholaus Corolla, [facility]

## 2011-01-06 NOTE — Assessment & Plan Note (Signed)
[facility] HEALTHCARE                            CARDIOLOGY OFFICE NOTE   Miranda [name], Miranda [name]                       MRN:          [id number]  DATE:04/08/2007                            DOB:          1941-04-22    PRIMARY CARE PHYSICIAN:  Nicholaus Corolla   REASON FOR PRESENTATION:  Evaluate patient with cardiomyopathy.   HISTORY OF PRESENT ILLNESS:  Patient is a pleasant 70 year old with  coronary disease and cardiomyopathy as described.  Unfortunately, she  had to come off pravastatin because of increased weakness.  This is her  most pressing complaint, leg weakness and pain.  She is doing physical  therapy and has seen an orthopedist, who thinks it might be her  medications.  However, I review and she is on a very low dose of ACE and  beta blocker.  Her symptoms seem to be out of proportion to what these  drugs might cause.  She is not describing any new presyncope or syncope.  She is having continued dizziness.  She is not having any chest pain.  She is not having any shortness of breath and has no PND or orthopnea.   PAST MEDICAL HISTORY:  Coronary artery disease (acute dyspnea with  pulmonary edema and erythema earlier this year with an EF of 40-50%.  The EF was 40% in our office.  She had nonobstructive coronary disease  at that time with left main 20% stenosis, LAD luminal irregularities, OM  40% stenosis, ramus intermedius 95% ostial stenosis.  The right coronary  artery is dominant, 40% proximal stenosis and 25% mid-stenosis),  hypertension, hyperlipidemia times 15 years, irritable bowel syndrome,  appendectomy, hemorrhoidectomy, carpal tunnel surgery.   ALLERGIES:  She has been intolerant to STATINS.   MEDICATIONS:  1. Ramipril 2.5 mg q.h.s.  2. Aspirin 325 mg daily.  3. Coreg 6.25 mg b.i.d.   REVIEW OF SYSTEMS:  As stated in the HPI, and otherwise negative for  other systems.   PHYSICAL EXAMINATION:  The patient is in no distress.  Blood  pressure  130/86, heart rate 71 and regular.  NECK:  No jugular venous distention at 45 degrees.  Carotid upstroke  brisk and symmetric.  No bruits, no thyromegaly.  LYMPHATICS:  No adenopathy.  LUNGS:  Clear to auscultation bilaterally.  BACK:  No costovertebral angle tenderness.  CHEST:  Unremarkable.  HEART:  PMI not displaced or sustained.  S1 and S2 within normal limits.  No S3, no S4, no murmurs.  ABDOMEN:  Flat, positive bowel sounds, normal in frequency and pitch.  No bruits, no rebound, no guarding, no midline pulsatile mass, no  organomegaly.  SKIN:  No rashes, no nodules.  EXTREMITIES:  Two-plus pulses, mild bilateral lower extremity edema.   ASSESSMENT AND PLAN:  1. Cardiomyopathy:  I am not going to be titrating her medications at      this point, as she has so many somatic complaints.  I have      encouraged her to follow up with her primary care doctor and      probably a neurologist to evaluate the leg  weakness, as I do not      think it is related to the ACE inhibitor or the beta blocker.  I      would like to see her in the future, if these symptoms become      better with exercise or further evaluation, so that I might titrate      her meds further.  2. Coronary disease:  She has disease as described and needs primary      risk reduction.  She is not tolerating statins.  She will continue      with the diet.  If her LDL goes up, I might consider Zetia though.      She has been intolerant to so many medications and has irritable      bowel syndrome.  She might have problems with this.  3. Followup:  I will see her back in four months, or sooner, if      needed.     Rollene Rotunda, MD, [facility]  Electronically Signed    JH/MedQ  DD: 04/08/2007  DT: 04/08/2007  Job #: [id number]   cc:   Nicholaus Corolla

## 2011-01-09 NOTE — Assessment & Plan Note (Signed)
 HEALTHCARE                         GASTROENTEROLOGY OFFICE NOTE   Miranda, [name]                       MRN:          [id number]  DATE:09/01/2006                            DOB:          05-Oct-1940    Miranda [name] is a 70 year old white female with irritable bowel syndrome,  last appointment May 31, 2006.  She has been on probiotics.  Her  tissue transglutaminase was negative.  She has tried antispasmodics but  is unable to tolerate them on a daily basis because of urinary  retention.  She has seen Dr. Vonita Moss for hematuria recently and had a  cystoscopy.  As far as her IBS is concerned, she is doing quite well.  She has a complaint of abdominal distention which occurs also at night  when she has tried to sleep.  She has some discomfort in her left or  right side of the abdomen as she rolls from one side to another.  She  has been on MiraLAX and prune juice for functional constipation.   PHYSICAL EXAMINATION:  VITAL SIGNS:  Blood pressure 136/74, pulse 80,  and weight 146 pounds.  GENERAL:  She was alert, oriented, in no distress.  LUNGS:  Clear to auscultation.  CARDIAC:  Normal S1, normal S2.  ABDOMEN:  Soft but with absent muscle tone.  She has actually visible  loops of the colon.  On palpation there were loops of the bowel under  the abdominal wall.  There was no tenderness, normal active bowel  sounds, no hernia.  RECTAL:  Exam not done.  EXTREMITIES:  Trace edema.   IMPRESSION:  A 70 year old white female with irritable bowel syndrome,  currently doing well on the probiotics, but having side effects from  antispasmodics which are causing urinary retention. I advised her to  take her Levsin and Levbid only on p.r.n. basis, preferably only Levsin  sublingually since it has short action.  I also advised her to use  abdominal binder to support her abdominal wall should she have problems  with abdominal distention. A significant  factor  in her abdominal  protuberance is from lack of any muscle tone.  I also encouraged her to  do exercises to strengthen her back and abdominal muscles.     Hedwig Morton. Juanda Chance, MD  Electronically Signed    DMB/MedQ  DD: 09/01/2006  DT: 09/01/2006  Job #: [id number]   cc:   Tilford Pillar, M.D.

## 2011-01-09 NOTE — Assessment & Plan Note (Signed)
[name]                            CARDIOLOGY OFFICE NOTE   Miranda, [name]                       MRN:          [id number]  DATE:10/28/2006                            DOB:          [birth year]    PRIMARY CARE PHYSICIAN:  Nicholaus Corolla, M.D., [facility].   REASON FOR PRESENTATION:  Patient with recent cardiac catheterization.   HISTORY OF PRESENT ILLNESS:  The patient is a pleasant 70 year old  without prior cardiac history until February of this year.  She was  visiting her daughter for the birth of a grandchild in [place],  [place].  She developed some chest discomfort.  This was described as a  tightness.  She was also short of breath and had been since coming to  [place].  She did have a CT which demonstrated no pulmonary embolism.  She had a Debutimide echocardiogram which demonstrated an old  anteroseptal MI and lateral wall ischemia with an EF of 40-50%.  She  subsequently had a cardiac catheterization.  We do not have that report  but we reviewed the films.  Her left main had 20% stenosis.  The LAD had  luminal irregularities.  There was an OM which had long 40% stenosis.  The ramus intermedius was small with ostial 95% stenosis.  The right  coronary artery was dominant, with 40% proximal stenosis and 25% mid  stenosis.  All her vessels were narrow caliber.  Her ejection fraction  did appear to be about 35%.  The patient was managed with Carbetalol and  Ramipril.   She is now home, says she has had no new symptoms.  She has had no  further chest discomfort, neck discomfort, arm discomfort, activity  induced nausea and vomiting, or excessive diaphoresis.  She has had no  palpitations, presyncope or syncope.  She has had no significant  shortness of breath, not like she had in [place].  She has had no PND or  orthopnea.   PAST MEDICAL HISTORY:  Hypertension, recent, hyperlipidemia x15 years,  irritable bowel syndrome.   PAST SURGICAL HISTORY:  Appendectomy, hemorrhoidectomy, carpal tunnel  surgery.   ALLERGIES:  She has been intolerant to STATIN DRUGS.   MEDICATIONS:  1. Coreg CR 10 mg daily.  2. Ramipril 2.5 mg daily.  3. Hyoscyamine.  4. MiraLax.   SOCIAL HISTORY:  The patient is retired.  She is divorced.  She has 2  children.  She smoked 3 packs of cigarettes a day for 20 years but quit  20 years ago.  She occasionally drinks wine.   FAMILY HISTORY:  Noncontributory for early coronary disease.  Both her  parents died at later ages of heart failure.   REVIEW OF SYSTEMS:  Positive for vertigo, constipation, recent hematuria  necessitating that she hold aspirin.   PHYSICAL EXAMINATION:  The patient is no distress.  Blood pressure  148/86, heart rate of 86 and regular, weight 143 pounds, body mass index  23.  HEENT:  Eyes unremarkable, pupils equal, round and reactive to light.  Fundi not visualized.  Oral mucosa unremarkable.  NECK:  No  jugular venous distention at 45 degrees, carotid upstroke  brisk and symmetric, no bruits, thyromegaly.  LYMPHATICS:  No cervical, axillary, inguinal adenopathy.  LUNGS:  Clear to auscultation bilaterally.  BACK:  No costovertebral angle tenderness.  CHEST:  Unremarkable.  HEART:  PMI nondisplaced or sustained.  S1 and S2 within normal limits,  no S3, S4, no clicks, rubs, murmurs.  ABDOMEN:  Flat, positive bowel sounds normal in pitch and frequency, no  bruits, rebound, guarding, no midline pulsatile mass, hepatomegaly,  splenomegaly.  SKIN:  No rashes, no nodules.  EXTREMITIES:  2+ pulses throughout, no cyanosis, clubbing or edema.  NEURO:  Oriented to person, place, time.  Cranial nerves II-XII grossly  intact, motor grossly intact.  EKG (October 03, 2006):  Normal sinus rhythm, low voltage in the limb  leads, leftward axis, poor anterior R-wave progression, QT prolonged.   ASSESSMENT AND PLAN:  1. Cardiomyopathy.  The patient does appear to have a  slightly reduced      ejection fraction.  She currently has Class I symptoms.  I am going      to continue medication titration.  I am going to switch her to      generics and put her on Coreg 6.25 mg b.i.d. and titrate up to 25      b.i.d. as target over time.  She is going to remain on the current      dose of Ramipril.  We discussed congestive heart failure and this      diagnosis.  She is going to get an echocardiogram to assess her EF      since we do not have these pictures.  2. Nonobstructive coronary disease.  The patient has no symptoms      related to this.  This can be managed medically with risk      reduction.  She has been intolerant of most Statins, however I am      going to try Pravastatin 20 mg daily and then check a lipid profile      in about 4-6 weeks.  3. Followup.  I will see her back in 4 weeks and at that time she will      be getting her echocardiogram.     Rollene Rotunda, MD, [facility]  Electronically Signed    JH/MedQ  DD: 10/28/2006  DT: 10/28/2006  Job #: [id number]   cc:   Dr, Nicholaus Corolla, [facility]

## 2011-01-09 NOTE — Assessment & Plan Note (Signed)
[facility] HEALTHCARE                            CARDIOLOGY OFFICE NOTE   Miranda [name], Miranda [name]                       MRN:          [id number]  DATE:12/01/2006                            DOB:          1941/06/03    PRIMARY CARE PHYSICIAN:  Nicholaus Corolla, M.D.   REASON FOR PRESENTATION:  Evaluate patient with cardiomyopathy.   HISTORY OF PRESENT ILLNESS:  The patient presents for followup.  She had  a recent history as described in the October 28, 2006, note.  She had  nonobstructive coronary disease in her major vessels, as described  below.  She had a mildly reduced ejection fraction.  I have been  titrating her meds and, at the last appointment, switched her from Coreg  once a day 10 mg to Coreg 6.25 mg b.i.d.  She has had no problems with  this.  She has had no new shortness of breath.  Denies any PND or  orthopnea.  She has had no palpitations, presyncope or syncope.  She has  had no chest pain.  She has been a little bit fatigued.   She had an echocardiogram today, but the results are pending.   PAST MEDICAL HISTORY:  Coronary artery disease (acute dyspnea with  pulmonary edema while in [place], EF 40-50%), nonobstructive coronary  disease, left main 20% stenosis; LAD luminal irregularities, OM 40%  stenosis, ramus intermedius at the ostial 95% stenosed, the right  coronary artery is dominant with 40% proximal stenosis and 25% mid-  stenosis.  The EF is about 35% on cath.  Hypertension, hyperlipidemia  times 15 years, irritable bowel syndrome, appendectomy,  hemorrhoidectomy, carpal tunnel surgery.   ALLERGIES:  She has been INTOLERANT TO STATINS.   MEDICATIONS:  1. Align.  2. Ramipril 2.5 mg q.h.s.  3. Aspirin 325 mg daily.  4. Pravastatin 20 mg q.h.s.  5. Coreg 6.25 mg b.i.d.   REVIEW OF SYSTEMS:  As stated in the HPI, and otherwise negative for  other systems.   PHYSICAL EXAMINATION:  The patient is in no distress.  Blood pressure  125/70,  heart rate 74 and regular.  Weight 139 pounds.  Body mass index  20.  HEENT:  Eyelids unremarkable.  Pupils equal, round and reactive to  light.  Fundi not visualized.  Oral mucosa unremarkable.  NECK:  No jugular venous distention, wave form within normal limits.  Carotid upstroke brisk and symmetric.  No bruits, no thyromegaly.  LYMPHATICS:  No cervical, axillary or inguinal adenopathy.  LUNGS:  Clear to auscultation bilaterally.  BACK:  No costovertebral angle tenderness.  CHEST:  Unremarkable.  HEART:  PMI not displaced or sustained.  S1 and S2 within normal limits.  No S3, no S4, no clicks, no rubs, no murmurs.  ABDOMEN:  Flat, positive bowel sounds, normal in frequency and pitch.  No bruits, rebound, guarding, no midline pulsatile mass, no  hepatomegaly, no splenomegaly.  SKIN:  No rashes, no nodules.  EXTREMITIES:  2+ pulses throughout, no edema, no cyanosis or clubbing.  NEUROLOGIC:  Oriented to person, place and time.  Cranial nerves II  through XII  grossly intact.  Motor grossly intact.   ASSESSMENT AND PLAN:  1. Cardiomyopathy:  Today, I plan to increase her Coreg to 12.5 mg      b.i.d.  The next step will probably be to go up on her Altace.  No      further cardiovascular testing is planned at this point.  2. Nonobstructive large-vessel coronary disease:  She will continue      with risk reduction.  I started her on Pravastatin, which she seems      to be tolerating.  I will leave her at the current dose.  3. Followup:  I will see her back in four weeks for the next med      titration.     Rollene Rotunda, MD, [facility]  Electronically Signed    JH/MedQ  DD: 12/01/2006  DT: 12/01/2006  Job #: [id number]   cc:   Dr. Nicholaus Corolla

## 2011-01-09 NOTE — Assessment & Plan Note (Signed)
[name]                         GASTROENTEROLOGY OFFICE NOTE   Miranda [name]                       MRN:          [id number]  DATE:10/27/2006                            DOB:          1941-04-22    Miranda [name] is a 70 year old white female with severe irritable bowel  syndrome.  She is lactose intolerance, evaluated in [name], and  also bacteria overgrowth.  She is on probiotics on a daily basis.  She  is having periodic attacks of abdominal pain, bloating.  She is really  never feeling well.  She has constant discomfort in the abdomen.  Diarrhea alternating with constipation.  She was in an emergency room in  [name] several weeks ago with what was described as gastroenteritis.  Patient takes prune juice, sometimes MiraLax.  She is intolerant to  Levbid and Robinul because of urinary retention.   PHYSICAL EXAMINATION:  Blood pressure 116/74.  Pulse 80.  Weight 144  pounds.  She is alert, oriented, and in no distress.  ABDOMEN:  Was soft, protuberant with essentially absent muscle tone.  Normoactive bowel sounds.  Tenderness along the left colon and splenic  flexure.  Normal right lower and upper quadrants.  No palpable mass.  RECTAL EXAM:  Normal rectal tone.  Stool was Hemoccult negative.   Last colonoscopy was done in 2003.   IMPRESSION:  A 70 year old white female with irritable bowel syndrome,  diarrhea/constipation, bloating.  Recent episode of left lower quadrant  discomfort.  Last colonoscopy 5 years ago in [name] did not show  any pathology.   PLAN:  1. Levbid 0.375 mg.  May use 1/2 tablet daily.  2. Levsin sublinqual 0.125 mg, take p.r.n.  3. May use prune juice and MiraLax.  4. Continue on probiotics.  5. Considered repeat colonoscopy.  Patient at this time is not      interested.     Miranda [name]. Miranda Chance, MD  Electronically Signed    DMB/MedQ  DD: 10/27/2006  DT: 10/27/2006  Job #: [id number]

## 2011-01-09 NOTE — Assessment & Plan Note (Signed)
[facility]                           GASTROENTEROLOGY OFFICE NOTE   Miranda [name], Miranda [name]                       MRN:          [id number]  DATE:05/31/2006                            DOB:          1941-05-03    Miranda [name] is very nice 70 year old white female with irritable bowel  syndrome, previously evaluated fully by Dr. Lucretia Field in [place] in 2003  and 2006.  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  Hydrogen breath test for bacterial overgrowth was also  positive.  Upper endoscopy and colonoscopy in 2003 were non-diagnostic.  She  continues to have regular bowel habits with predominant constipation for  which she takes MiraLax and prune juice.  She also had several episodes of  diarrhea in the last 24 hours and has taken Levsin 0.125 mg twice a day.  She denies any rectal bleeding, weight changes.  On 2 recent occasions,  patient developed severe nausea and vomiting, one lasting 15 hours and most  recent was lasting about 5 hours.   MEDICATIONS:  1. Zyrtec 10 mg q.d.  2. Levsin 0.125 mg p.r.n.  3. Levbid 0.375 mg b.i.d.  4. Align Probiotic one daily.  5. She has completed a course of Flagyl 250 p.o. t.i.d.   PHYSICAL EXAMINATION:  VITAL SIGNS:  Blood pressure 188/64, pulse 88 and  weight 142 pounds.  Patient was somewhat anxious, alert and oriented.  LUNGS:  Clear to auscultation.  COR:  Normal S1, S2.  ABDOMEN:  Soft with normoactive bowel sounds.  Minimal tenderness in  epigastric area.  No radiation to the back.  RECTAL:  Done with heme-occult negative stool.   IMPRESSION:  32. A 70 year old white female with IBS/constipation.  2. Epigastric pain following nausea and vomiting, mild gastritis.  Status      post GI evaluation 2003.   PLAN:  1. Digital transglutaminase levels today.  2. AcipHex samples 20 mg daily for 10 days.  3. Switch from Levbid to Robinul 10 mg b.i.d. because of her  pharmacy has      problems      obtaining the Levbid.  4. Patient is due for colonoscopy in August 2008.       Hedwig Morton. Juanda Chance, MD      DMB/MedQ  DD:  05/31/2006  DT:  06/01/2006  Job #:  [id number]   cc:   Nyra Capes, Internist

## 2011-01-12 ENCOUNTER — Ambulatory Visit: Payer: Medicare Other | Admitting: Cardiology

## 2011-01-30 ENCOUNTER — Ambulatory Visit (INDEPENDENT_AMBULATORY_CARE_PROVIDER_SITE_OTHER): Payer: Medicare Other | Admitting: *Deleted

## 2011-01-30 ENCOUNTER — Encounter: Payer: Self-pay | Admitting: Cardiology

## 2011-01-30 ENCOUNTER — Ambulatory Visit (INDEPENDENT_AMBULATORY_CARE_PROVIDER_SITE_OTHER): Payer: Medicare Other | Admitting: Cardiology

## 2011-01-30 ENCOUNTER — Encounter (HOSPITAL_COMMUNITY): Payer: Self-pay | Admitting: Cardiology

## 2011-01-30 DIAGNOSIS — I498 Other specified cardiac arrhythmias: Secondary | ICD-10-CM

## 2011-01-30 DIAGNOSIS — I428 Other cardiomyopathies: Secondary | ICD-10-CM

## 2011-01-30 DIAGNOSIS — Z7901 Long term (current) use of anticoagulants: Secondary | ICD-10-CM

## 2011-01-30 DIAGNOSIS — I10 Essential (primary) hypertension: Secondary | ICD-10-CM

## 2011-01-30 LAB — BASIC METABOLIC PANEL
Calcium: 8.9 mg/dL (ref 8.4–10.5)
GFR: 83.43 mL/min (ref >60.00)
Sodium: 143 mEq/L (ref 135–145)

## 2011-01-30 LAB — POCT INR: INR: 2.6

## 2011-01-30 NOTE — Assessment & Plan Note (Signed)
Her CHF is stable.  Echo in early April demonstrated an EF of 35%.  She will continue the meds as listed and I will check a BMET today.

## 2011-01-30 NOTE — Progress Notes (Signed)
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  Since I last saw her she has had no new complaints.  She denies any new dypsnea, PND or orthopnea.  She has had no chest pain, neck or arm pain.  She has chronic lower extremity edema but this is stable.  She is doing a better job watching her salt.  She denies palpitaitons, presyncope or syncope.   Allergies  Allergen Reactions  . Statins     Current Outpatient Prescriptions  Medication Sig Dispense Refill  . diflunisal (DOLOBID) 500 MG TABS 1/2 tablet twice daily       . furosemide (LASIX) 40 MG tablet Take 2-3 tablets (80-120 mg total) by mouth daily as needed. 2-3 tablets daily as needed  270 tablet  3  . gabapentin (NEURONTIN) 100 MG capsule 100 mg. as needed      . loperamide (IMODIUM) 2 MG capsule Take 2 mg by mouth 2 (two) times daily.        . potassium chloride (K-DUR) 10 MEQ tablet 3 tablets daily or as directed  270 tablet  3  . Probiotic Product (ALIGN PO) Take by mouth daily.        . vitamin B-12 (CYANOCOBALAMIN) 1000 MCG tablet Inject 1,000 mcg as directed every 30 (thirty) days.        Marland Kitchen warfarin (COUMADIN) 2.5 MG tablet Take 1 tablet (2.5 mg total) by mouth as directed.  60 tablet  3  . DISCONTD: esomeprazole (NEXIUM) 40 MG capsule Take 40 mg by mouth daily.        Marland Kitchen DISCONTD: sucralfate (CARAFATE) 1 GM/10ML suspension as needed. Take 10 cc by mouth 4 times daily        Past Medical History  Diagnosis Date  . Syncope   . Edema   . Dizziness   . Hemorrhoids, internal   . Lactose intolerance   . Depression   . Anxiety   . IBS (irritable bowel syndrome)   . Dyslipidemia   . Hypertension   . Coronary artery disease   . Cardiomyopathy     30%  . Constipation   . Amyloid disease     Past Surgical History  Procedure Date  . Appendectomy   . Hemorrhoid surgery   . Carpal tunnel release   . Cystoscopy     ROS:  As stated in the HPI and negative for all other systems.  PHYSICAL EXAM BP  95/46  Pulse 63  Resp 16  Ht 5\' 5"  (1.651 m)  Wt 130 lb 1.9 oz (59.022 kg)  BMI 21.65 kg/m2 PHYSICAL EXAM GEN:  No distress NECK:  Jugular venous distention at 90 degrees, waveform within normal limits, carotid upstroke brisk and symmetric, no bruits, no thyromegaly LYMPHATICS:  No cervical adenopathy LUNGS:  Clear to auscultation bilaterally BACK:  No CVA tenderness CHEST:  Unremarkable HEART:  S1 and S2 within normal limits, no S3, no S4, no clicks, no rubs, no murmurs ABD:  Positive bowel sounds normal in frequency in pitch, no bruits, no rebound, no guarding, unable to assess midline mass or bruit with the patient seated. EXT:  2 plus pulses throughout, moderate edema, no cyanosis no clubbing SKIN:  No rashes no nodules NEURO:  Cranial nerves II through XII grossly intact, motor grossly intact throughout PSYCH:  Cognitively intact, oriented to person place and time  ASSESSMENT AND PLAN

## 2011-01-30 NOTE — Assessment & Plan Note (Signed)
Her blood pressure is actually running low.  I cannot further titrate her meds.

## 2011-01-30 NOTE — Patient Instructions (Signed)
Your physician recommends that you schedule a follow-up appointment in: 6 months with Dr. Hochrein  

## 2011-02-02 ENCOUNTER — Encounter: Payer: Medicare Other | Admitting: *Deleted

## 2011-02-04 NOTE — Progress Notes (Signed)
Attempted to call pt with results however the phone rang twice and there was no one on the line.  Will continue to attempt to call.

## 2011-02-11 ENCOUNTER — Encounter: Payer: Self-pay | Admitting: Cardiology

## 2011-02-24 ENCOUNTER — Ambulatory Visit (INDEPENDENT_AMBULATORY_CARE_PROVIDER_SITE_OTHER): Payer: Medicare Other | Admitting: *Deleted

## 2011-02-24 DIAGNOSIS — I498 Other specified cardiac arrhythmias: Secondary | ICD-10-CM

## 2011-02-24 DIAGNOSIS — Z7901 Long term (current) use of anticoagulants: Secondary | ICD-10-CM

## 2011-03-12 ENCOUNTER — Ambulatory Visit (INDEPENDENT_AMBULATORY_CARE_PROVIDER_SITE_OTHER): Payer: Medicare Other | Admitting: *Deleted

## 2011-03-12 DIAGNOSIS — Z7901 Long term (current) use of anticoagulants: Secondary | ICD-10-CM

## 2011-03-12 DIAGNOSIS — I498 Other specified cardiac arrhythmias: Secondary | ICD-10-CM

## 2011-03-16 ENCOUNTER — Encounter: Payer: Self-pay | Admitting: Podiatry

## 2011-03-31 ENCOUNTER — Telehealth: Payer: Self-pay | Admitting: *Deleted

## 2011-03-31 NOTE — Telephone Encounter (Signed)
Patient had her last colonoscopy in 2003 which showed internal hemorrhoids. Her last endoscopy was completed in 2003 (was normal). Per Dr Juanda Chance, patient is overdue for endoscopy and colonoscopy. Patient states that she does not wish to schedule endoscopy or colonoscopy at this time. I have reminded the patient as to the importance of follow up endo/colon and she verbalizes understanding.

## 2011-04-02 ENCOUNTER — Telehealth: Payer: Self-pay | Admitting: Cardiology

## 2011-04-02 ENCOUNTER — Other Ambulatory Visit: Payer: Medicare Other | Admitting: *Deleted

## 2011-04-02 ENCOUNTER — Other Ambulatory Visit (INDEPENDENT_AMBULATORY_CARE_PROVIDER_SITE_OTHER): Payer: Medicare Other | Admitting: *Deleted

## 2011-04-02 ENCOUNTER — Ambulatory Visit (INDEPENDENT_AMBULATORY_CARE_PROVIDER_SITE_OTHER): Payer: Medicare Other | Admitting: *Deleted

## 2011-04-02 DIAGNOSIS — I428 Other cardiomyopathies: Secondary | ICD-10-CM

## 2011-04-02 DIAGNOSIS — Z7901 Long term (current) use of anticoagulants: Secondary | ICD-10-CM

## 2011-04-02 DIAGNOSIS — D649 Anemia, unspecified: Secondary | ICD-10-CM

## 2011-04-02 DIAGNOSIS — I498 Other specified cardiac arrhythmias: Secondary | ICD-10-CM

## 2011-04-02 DIAGNOSIS — I10 Essential (primary) hypertension: Secondary | ICD-10-CM

## 2011-04-02 LAB — CBC WITH DIFFERENTIAL/PLATELET
Basophils Absolute: 0 10*3/uL (ref 0.0–0.1)
Eosinophils Absolute: 0.2 10*3/uL (ref 0.0–0.7)
Hemoglobin: 9.4 g/dL — ABNORMAL LOW (ref 12.0–15.0)
Lymphocytes Relative: 39.1 % (ref 12.0–46.0)
MCHC: 31.6 g/dL (ref 30.0–36.0)
Monocytes Relative: 6.2 % (ref 3.0–12.0)
Neutrophils Relative %: 51.8 % (ref 43.0–77.0)
RBC: 3.9 Mil/uL (ref 3.87–5.11)
RDW: 21.2 % — ABNORMAL HIGH (ref 11.5–14.6)

## 2011-04-02 LAB — BASIC METABOLIC PANEL
CO2: 30 mEq/L (ref 19–32)
Calcium: 8.6 mg/dL (ref 8.4–10.5)
Creatinine, Ser: 0.7 mg/dL (ref 0.4–1.2)
GFR: 84.72 mL/min (ref >60.00)
Glucose, Bld: 87 mg/dL (ref 70–99)
Sodium: 140 mEq/L (ref 135–145)

## 2011-04-02 LAB — POCT INR: INR: 3

## 2011-04-02 NOTE — Telephone Encounter (Signed)
Patient states she has blood work done every 3 to 4 weeks.BMET ,CBC order in, Pt.aware she is in the office at this time.

## 2011-04-02 NOTE — Telephone Encounter (Signed)
Left a message to call back.

## 2011-04-02 NOTE — Telephone Encounter (Signed)
Pt coming over today for coumadin clinic. Would like to have blood work drawn today if possible. Also include anemia.

## 2011-04-09 ENCOUNTER — Other Ambulatory Visit: Payer: Self-pay | Admitting: Cardiology

## 2011-04-09 DIAGNOSIS — I6529 Occlusion and stenosis of unspecified carotid artery: Secondary | ICD-10-CM

## 2011-04-10 ENCOUNTER — Encounter (INDEPENDENT_AMBULATORY_CARE_PROVIDER_SITE_OTHER): Payer: Medicare Other | Admitting: *Deleted

## 2011-04-10 ENCOUNTER — Encounter: Payer: Medicare Other | Admitting: Cardiology

## 2011-04-10 ENCOUNTER — Other Ambulatory Visit: Payer: Self-pay | Admitting: *Deleted

## 2011-04-10 DIAGNOSIS — I6529 Occlusion and stenosis of unspecified carotid artery: Secondary | ICD-10-CM

## 2011-04-10 DIAGNOSIS — D649 Anemia, unspecified: Secondary | ICD-10-CM

## 2011-04-22 ENCOUNTER — Telehealth: Payer: Self-pay | Admitting: *Deleted

## 2011-04-22 NOTE — Telephone Encounter (Signed)
PT CALLED TO FIND OUT WHICH NEPHROLOGIST SHE SHOULD SEE FOR RECURRENT INFECTIONS.  HE SUGGESTED DR DETERDING.  PT IS AWARE AND IS GRATEFUL.

## 2011-04-23 ENCOUNTER — Ambulatory Visit (INDEPENDENT_AMBULATORY_CARE_PROVIDER_SITE_OTHER): Payer: Medicare Other | Admitting: *Deleted

## 2011-04-23 DIAGNOSIS — I4891 Unspecified atrial fibrillation: Secondary | ICD-10-CM

## 2011-04-23 DIAGNOSIS — D649 Anemia, unspecified: Secondary | ICD-10-CM

## 2011-04-23 DIAGNOSIS — Z7901 Long term (current) use of anticoagulants: Secondary | ICD-10-CM

## 2011-04-23 DIAGNOSIS — I498 Other specified cardiac arrhythmias: Secondary | ICD-10-CM

## 2011-04-23 DIAGNOSIS — I10 Essential (primary) hypertension: Secondary | ICD-10-CM

## 2011-04-23 MED ORDER — WARFARIN SODIUM 2.5 MG PO TABS: 2.5000 mg | ORAL_TABLET | ORAL | Status: DC

## 2011-04-24 LAB — CBC WITH DIFFERENTIAL/PLATELET
Basophils Relative: 0.2 % (ref 0.0–3.0)
Eosinophils Absolute: 0.1 10*3/uL (ref 0.0–0.7)
Lymphocytes Relative: 37.5 % (ref 12.0–46.0)
MCHC: 31.6 g/dL (ref 30.0–36.0)
Monocytes Absolute: 0.4 10*3/uL (ref 0.1–1.0)
Neutrophils Relative %: 54.5 % (ref 43.0–77.0)
Platelets: 223 10*3/uL (ref 150.0–400.0)
RBC: 4.02 Mil/uL (ref 3.87–5.11)
WBC: 6.1 10*3/uL (ref 4.5–10.5)

## 2011-05-07 ENCOUNTER — Ambulatory Visit (INDEPENDENT_AMBULATORY_CARE_PROVIDER_SITE_OTHER): Payer: Medicare Other | Admitting: *Deleted

## 2011-05-07 DIAGNOSIS — Z7901 Long term (current) use of anticoagulants: Secondary | ICD-10-CM

## 2011-05-07 DIAGNOSIS — I10 Essential (primary) hypertension: Secondary | ICD-10-CM

## 2011-05-07 DIAGNOSIS — I2510 Atherosclerotic heart disease of native coronary artery without angina pectoris: Secondary | ICD-10-CM

## 2011-05-07 DIAGNOSIS — I498 Other specified cardiac arrhythmias: Secondary | ICD-10-CM

## 2011-05-07 LAB — POCT INR: INR: 2.8

## 2011-05-07 LAB — BASIC METABOLIC PANEL
Calcium: 8.4 mg/dL (ref 8.4–10.5)
GFR: 65.47 mL/min (ref >60.00)
Glucose, Bld: 88 mg/dL (ref 70–99)
Potassium: 4 mEq/L (ref 3.5–5.1)
Sodium: 141 mEq/L (ref 135–145)

## 2011-05-17 ENCOUNTER — Other Ambulatory Visit: Payer: Self-pay | Admitting: Cardiology

## 2011-05-18 ENCOUNTER — Other Ambulatory Visit: Payer: Self-pay | Admitting: *Deleted

## 2011-05-18 MED ORDER — POTASSIUM CHLORIDE ER 10 MEQ PO TBCR: EXTENDED_RELEASE_TABLET | ORAL | Status: DC

## 2011-05-21 ENCOUNTER — Encounter: Payer: Medicare Other | Admitting: *Deleted

## 2011-06-02 ENCOUNTER — Telehealth: Payer: Self-pay | Admitting: Cardiology

## 2011-06-02 DIAGNOSIS — D649 Anemia, unspecified: Secondary | ICD-10-CM

## 2011-06-02 NOTE — Telephone Encounter (Signed)
Pt calling to inform that she will be in Thursday for INR at 3pm. Pt wanted to know if blood work can be ordered and scheduled for the same day/time. Please return pt call to discuss further.

## 2011-06-02 NOTE — Telephone Encounter (Signed)
States she is coming in Thursday for INR and wants to get BMET,CBC at that time. Will put order in.

## 2011-06-04 ENCOUNTER — Ambulatory Visit (INDEPENDENT_AMBULATORY_CARE_PROVIDER_SITE_OTHER): Payer: Medicare Other | Admitting: *Deleted

## 2011-06-04 ENCOUNTER — Other Ambulatory Visit (INDEPENDENT_AMBULATORY_CARE_PROVIDER_SITE_OTHER): Payer: Medicare Other | Admitting: *Deleted

## 2011-06-04 DIAGNOSIS — Z7901 Long term (current) use of anticoagulants: Secondary | ICD-10-CM

## 2011-06-04 DIAGNOSIS — I498 Other specified cardiac arrhythmias: Secondary | ICD-10-CM

## 2011-06-04 DIAGNOSIS — D649 Anemia, unspecified: Secondary | ICD-10-CM

## 2011-06-04 LAB — POCT INR: INR: 3

## 2011-06-04 LAB — CBC WITH DIFFERENTIAL/PLATELET
Basophils Relative: 0.2 % (ref 0.0–3.0)
Eosinophils Absolute: 0.1 10*3/uL (ref 0.0–0.7)
Eosinophils Relative: 0.6 % (ref 0.0–5.0)
Hemoglobin: 11.4 g/dL — ABNORMAL LOW (ref 12.0–15.0)
Lymphocytes Relative: 19.1 % (ref 12.0–46.0)
MCHC: 32.2 g/dL (ref 30.0–36.0)
Monocytes Relative: 5.6 % (ref 3.0–12.0)
Neutro Abs: 7.1 10*3/uL (ref 1.4–7.7)
Neutrophils Relative %: 74.5 % (ref 43.0–77.0)
RBC: 4.06 Mil/uL (ref 3.87–5.11)
WBC: 9.6 10*3/uL (ref 4.5–10.5)

## 2011-06-04 LAB — BASIC METABOLIC PANEL
CO2: 30 mEq/L (ref 19–32)
Calcium: 8.7 mg/dL (ref 8.4–10.5)
Creatinine, Ser: 0.9 mg/dL (ref 0.4–1.2)
GFR: 65.46 mL/min (ref >60.00)
Glucose, Bld: 110 mg/dL — ABNORMAL HIGH (ref 70–99)
Sodium: 140 mEq/L (ref 135–145)

## 2011-06-10 LAB — I-STAT 8, (EC8 V) (CONVERTED LAB)
Acid-Base Excess: 4 — ABNORMAL HIGH
Bicarbonate: 29.7 — ABNORMAL HIGH
Glucose, Bld: 82
Potassium: 4.3
TCO2: 31
pH, Ven: 7.416 — ABNORMAL HIGH

## 2011-06-16 ENCOUNTER — Other Ambulatory Visit: Payer: Self-pay | Admitting: Cardiology

## 2011-06-17 ENCOUNTER — Other Ambulatory Visit: Payer: Self-pay | Admitting: *Deleted

## 2011-06-17 DIAGNOSIS — I2510 Atherosclerotic heart disease of native coronary artery without angina pectoris: Secondary | ICD-10-CM

## 2011-06-17 MED ORDER — DIFLUNISAL 500 MG PO TABS: 500.0000 mg | ORAL_TABLET | Freq: Two times a day (BID) | ORAL | Status: DC

## 2011-06-23 ENCOUNTER — Telehealth: Payer: Self-pay | Admitting: Cardiology

## 2011-06-23 NOTE — Telephone Encounter (Signed)
New message:  Pt called and stated she is cleared to go off of coumadin.  During this time before and after is ok for her to fly or is it a higher risk?  Please call and advise.

## 2011-06-23 NOTE — Telephone Encounter (Signed)
Will double check with Dr Antoine Poche and let pt know.

## 2011-06-24 NOTE — Telephone Encounter (Signed)
She is not at higher risk for flying.  She would need to take all of the other precautions that people take when flying.

## 2011-06-25 NOTE — Telephone Encounter (Signed)
Pt aware OK to fly

## 2011-06-30 ENCOUNTER — Other Ambulatory Visit: Payer: Self-pay | Admitting: Urology

## 2011-07-02 ENCOUNTER — Ambulatory Visit (INDEPENDENT_AMBULATORY_CARE_PROVIDER_SITE_OTHER): Payer: Medicare Other | Admitting: *Deleted

## 2011-07-02 DIAGNOSIS — I498 Other specified cardiac arrhythmias: Secondary | ICD-10-CM

## 2011-07-02 DIAGNOSIS — Z7901 Long term (current) use of anticoagulants: Secondary | ICD-10-CM

## 2011-07-02 LAB — POCT INR: INR: 2.8

## 2011-07-07 ENCOUNTER — Other Ambulatory Visit: Payer: Self-pay | Admitting: Cardiology

## 2011-07-23 NOTE — Telephone Encounter (Signed)
Please close encounter

## 2011-08-04 ENCOUNTER — Encounter (HOSPITAL_COMMUNITY): Payer: Self-pay | Admitting: Pharmacy Technician

## 2011-08-04 ENCOUNTER — Encounter (HOSPITAL_COMMUNITY): Payer: Self-pay | Admitting: *Deleted

## 2011-08-04 NOTE — Progress Notes (Signed)
Pt instructed to arrive to [facility] short stay 08/10/11 at 1230 for 1430 app't. Pt lives alone; however, she states that her sister will drive her and either her sister or son will stay with her post procedure.Pt takes dolobid and will stop it 08/07/11. Pt is on coumadin and last dose will be 08/04/11.  Last EKG was 3/12 and no new cardiac symptoms since that date. She is to see Dr Antoine Poche on 08/06/11 and go to coumadin clinic 08/07/11. PT/INR to be ordered for next Monday prior to her ESWL.  Pt instructed not to take any NSAIDS or aspirin. To bring blue folder the day of her procedure. NPO after 0630 the day of her procedure. She is to bring responsible driver, blue folder, and insurance card. She verbalizes understanding.

## 2011-08-06 ENCOUNTER — Encounter: Payer: Self-pay | Admitting: Cardiology

## 2011-08-06 ENCOUNTER — Ambulatory Visit (INDEPENDENT_AMBULATORY_CARE_PROVIDER_SITE_OTHER): Payer: Medicare Other | Admitting: Cardiology

## 2011-08-06 DIAGNOSIS — R5383 Other fatigue: Secondary | ICD-10-CM

## 2011-08-06 DIAGNOSIS — I10 Essential (primary) hypertension: Secondary | ICD-10-CM

## 2011-08-06 DIAGNOSIS — I429 Cardiomyopathy, unspecified: Secondary | ICD-10-CM

## 2011-08-06 DIAGNOSIS — I6529 Occlusion and stenosis of unspecified carotid artery: Secondary | ICD-10-CM

## 2011-08-06 DIAGNOSIS — R609 Edema, unspecified: Secondary | ICD-10-CM

## 2011-08-06 DIAGNOSIS — I2510 Atherosclerotic heart disease of native coronary artery without angina pectoris: Secondary | ICD-10-CM

## 2011-08-06 LAB — CBC WITH DIFFERENTIAL/PLATELET
Basophils Absolute: 0 10*3/uL (ref 0.0–0.1)
Eosinophils Absolute: 0.2 10*3/uL (ref 0.0–0.7)
Lymphocytes Relative: 31.8 % (ref 12.0–46.0)
MCHC: 32.9 g/dL (ref 30.0–36.0)
Monocytes Relative: 5.5 % (ref 3.0–12.0)
Neutro Abs: 3.8 10*3/uL (ref 1.4–7.7)
Neutrophils Relative %: 59.9 % (ref 43.0–77.0)
Platelets: 188 10*3/uL (ref 150.0–400.0)
RDW: 19 % — ABNORMAL HIGH (ref 11.5–14.6)

## 2011-08-06 LAB — BASIC METABOLIC PANEL
BUN: 25 mg/dL — ABNORMAL HIGH (ref 6–23)
CO2: 34 mEq/L — ABNORMAL HIGH (ref 19–32)
Calcium: 9.1 mg/dL (ref 8.4–10.5)
Creatinine, Ser: 1.2 mg/dL (ref 0.4–1.2)
GFR: 47.4 mL/min — ABNORMAL LOW (ref >60.00)
Glucose, Bld: 130 mg/dL — ABNORMAL HIGH (ref 70–99)

## 2011-08-06 NOTE — Assessment & Plan Note (Signed)
She continues with Lasix  and conservative measures. No change in therapy is indicated.

## 2011-08-06 NOTE — Assessment & Plan Note (Signed)
Her blood pressure runs low but she's had no symptomatic hypotension. No change in therapy is indicated.

## 2011-08-06 NOTE — Assessment & Plan Note (Signed)
She has moderate carotid stenosis and this will be followed up in February.

## 2011-08-06 NOTE — Patient Instructions (Signed)
Please have blood work today. (BMP,CBC)  The current medical regimen is effective;  continue present plan and medications.  Please schedule for repeat carotid doppler due in February 2013.  Follow up with Dr Antoine Poche in 2 months

## 2011-08-06 NOTE — Progress Notes (Signed)
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  Since I last saw her she has had no new complaints. He says she feels pretty well. She manages her fluids with when necessary diuretics. She takes about 40 mg of Lasix daily but then as needed. She's trying to watch her salt better. She denies any new PND or orthopnea. She has no new palpitations, presyncope or syncope. She's had no chest pressure, neck or arm discomfort.  Allergies  Allergen Reactions  . Statins Other (See Comments)    LEG CRAMPS     Current Outpatient Prescriptions  Medication Sig Dispense Refill  . Alpha-D-Galactosidase (BEANO PO) Take 1-2 tablets by mouth 2 (two) times daily as needed. GAS        . Calcium Citrate-Vitamin D (CITRACAL + D PO) Take 2 tablets by mouth daily.        . diflunisal (DOLOBID) 500 MG TABS Take 250 mg by mouth 2 (two) times daily.        Marland Kitchen docusate sodium (COLACE) 100 MG capsule Take 100 mg by mouth 2 (two) times daily.        Marland Kitchen esomeprazole (NEXIUM) 40 MG capsule Take 40 mg by mouth 2 (two) times daily.        . ferrous sulfate 325 (65 FE) MG tablet Take 325 mg by mouth daily with breakfast.        . furosemide (LASIX) 20 MG tablet Take 20 mg by mouth daily as needed. SWELLING.  PT CAN TAKE UP TO 120 MG. PT DOES HAVE A RX FOR BOTH 40 MG AND 20 MG.        . furosemide (LASIX) 40 MG tablet Take 40-120 mg by mouth daily as needed. SWELLING.  PT CAN TAKE UP TO 120 MG. PT DOES HAVE A RX FOR BOTH 40 MG AND 20 MG.        . gabapentin (NEURONTIN) 100 MG capsule Take 100 mg by mouth every 4 (four) hours as needed. PAIN        . Hypromellose (GENTEAL) 0.3 % SOLN Place 1 drop into both eyes 2 (two) times daily as needed. DRY EYES        . loperamide (IMODIUM) 2 MG capsule Take 2 mg by mouth 2 (two) times daily.        . Multiple Vitamins-Minerals (MULTIVITAMINS THER. W/MINERALS) TABS Take 1 tablet by mouth daily.        . Nutritional Supplements (CARNATION INSTANT BREAKFAST PO) Take 1  Package by mouth 2 (two) times daily.        . potassium chloride (K-DUR) 10 MEQ tablet Take 10-30 mEq by mouth daily as needed. FLUID        . Probiotic Product (ALIGN PO) Take 1 capsule by mouth daily.       . Simethicone 180 MG CAPS Take 1-2 capsules by mouth 2 (two) times daily as needed. GAS        . vitamin B-12 (CYANOCOBALAMIN) 1000 MCG tablet Inject 1,000 mcg as directed every 30 (thirty) days.        Marland Kitchen warfarin (COUMADIN) 2.5 MG tablet Take 2.5-5 mg by mouth at bedtime. 2 TABLETS ON Tuesday AND ALL OTHER DAYS IS 1 TABLET.         Past Medical History  Diagnosis Date  . Syncope   . Edema   . Dizziness   . Hemorrhoids, internal   . Lactose intolerance   . Depression   . Anxiety   .  IBS (irritable bowel syndrome)   . Dyslipidemia   . Hypertension   . Coronary artery disease   . Cardiomyopathy     30%  . Constipation   . Amyloid disease   . Chronic kidney disease     kidney stone    Past Surgical History  Procedure Date  . Hemorrhoid surgery   . Carpal tunnel release   . Cystoscopy   . Appendectomy 1961    ROS:  As stated in the HPI and negative for all other systems.  PHYSICAL EXAM BP 104/62  Pulse 68  Resp 18  Ht 5\' 5"  (1.651 m)  Wt 132 lb 6.4 oz (60.056 kg)  BMI 22.03 kg/m2  LMP 08/04/1991 PHYSICAL EXAM GEN:  No distress NECK:  Jugular venous distention at 90 degrees, waveform within normal limits, carotid upstroke brisk and symmetric, no bruits, no thyromegaly LYMPHATICS:  No cervical adenopathy LUNGS:  Clear to auscultation bilaterally BACK:  No CVA tenderness CHEST:  Unremarkable HEART:  S1 and S2 within normal limits, no S3, no S4, no clicks, no rubs, no murmurs ABD:  Positive bowel sounds normal in frequency in pitch, no bruits, no rebound, no guarding, unable to assess midline mass or bruit with the patient seated. EXT:  2 plus pulses throughout, moderate edema, no cyanosis no clubbing SKIN:  No rashes no nodules NEURO:  Cranial nerves II  through XII grossly intact, motor grossly intact throughout PSYCH:  Cognitively intact, oriented to person place and time  EKG:  Sinus rhythm, rate 68, left bundle branch block 08/06/2011   ASSESSMENT AND PLAN

## 2011-08-06 NOTE — Assessment & Plan Note (Signed)
She seems to be well compensated with this. She will continue the meds as listed. I will check a CBC and a basic metabolic profile today.

## 2011-08-07 ENCOUNTER — Ambulatory Visit (INDEPENDENT_AMBULATORY_CARE_PROVIDER_SITE_OTHER): Payer: Medicare Other | Admitting: *Deleted

## 2011-08-07 DIAGNOSIS — Z7901 Long term (current) use of anticoagulants: Secondary | ICD-10-CM

## 2011-08-07 DIAGNOSIS — I498 Other specified cardiac arrhythmias: Secondary | ICD-10-CM

## 2011-08-07 LAB — POCT INR: INR: 1.3

## 2011-08-10 ENCOUNTER — Ambulatory Visit (HOSPITAL_COMMUNITY)
Admission: RE | Admit: 2011-08-10 | Discharge: 2011-08-10 | Disposition: A | Discharge status: Home / Self Care | Payer: Medicare Other | Source: Ambulatory Visit | Attending: Urology | Admitting: Urology

## 2011-08-10 ENCOUNTER — Encounter (HOSPITAL_COMMUNITY): Payer: Self-pay | Admitting: *Deleted

## 2011-08-10 ENCOUNTER — Encounter (HOSPITAL_COMMUNITY)
Admission: RE | Disposition: A | Discharge status: Home / Self Care | Payer: Self-pay | Source: Ambulatory Visit | Attending: Urology

## 2011-08-10 ENCOUNTER — Ambulatory Visit (HOSPITAL_COMMUNITY): Payer: Medicare Other

## 2011-08-10 DIAGNOSIS — Z7901 Long term (current) use of anticoagulants: Secondary | ICD-10-CM | POA: Insufficient documentation

## 2011-08-10 DIAGNOSIS — N200 Calculus of kidney: Secondary | ICD-10-CM | POA: Insufficient documentation

## 2011-08-10 DIAGNOSIS — Z01818 Encounter for other preprocedural examination: Secondary | ICD-10-CM | POA: Insufficient documentation

## 2011-08-10 DIAGNOSIS — I4891 Unspecified atrial fibrillation: Secondary | ICD-10-CM | POA: Insufficient documentation

## 2011-08-10 DIAGNOSIS — M4120 Other idiopathic scoliosis, site unspecified: Secondary | ICD-10-CM | POA: Insufficient documentation

## 2011-08-10 DIAGNOSIS — E7800 Pure hypercholesterolemia, unspecified: Secondary | ICD-10-CM | POA: Insufficient documentation

## 2011-08-10 DIAGNOSIS — Z79899 Other long term (current) drug therapy: Secondary | ICD-10-CM | POA: Insufficient documentation

## 2011-08-10 DIAGNOSIS — I509 Heart failure, unspecified: Secondary | ICD-10-CM | POA: Insufficient documentation

## 2011-08-10 DIAGNOSIS — E859 Amyloidosis, unspecified: Secondary | ICD-10-CM | POA: Insufficient documentation

## 2011-08-10 DIAGNOSIS — M479 Spondylosis, unspecified: Secondary | ICD-10-CM | POA: Insufficient documentation

## 2011-08-10 DIAGNOSIS — T148XXA Other injury of unspecified body region, initial encounter: Secondary | ICD-10-CM

## 2011-08-10 HISTORY — PX: EXTRACORPOREAL SHOCK WAVE LITHOTRIPSY: SHX1557

## 2011-08-10 LAB — PROTIME-INR
INR: 1.15 (ref 0.00–1.49)
Prothrombin Time: 14.9 seconds (ref 11.6–15.2)

## 2011-08-10 SURGERY — LITHOTRIPSY, ESWL
Anesthesia: LOCAL | Laterality: Right

## 2011-08-10 MED ORDER — DIAZEPAM 5 MG/ML IJ SOLN: 10.0000 mg | Freq: Once | INTRAMUSCULAR | Status: DC

## 2011-08-10 MED ORDER — DIPHENHYDRAMINE HCL 25 MG PO CAPS
ORAL_CAPSULE | ORAL | Status: AC
  Administered 2011-08-10: 25 mg via ORAL
  Filled 2011-08-10: qty 1

## 2011-08-10 MED ORDER — DEXTROSE-NACL 5-0.45 % IV SOLN
INTRAVENOUS | Status: DC
  Administered 2011-08-10: 13:00:00 via INTRAVENOUS

## 2011-08-10 MED ORDER — HYDROCODONE-ACETAMINOPHEN 5-325 MG PO TABS: 1.0000 | ORAL_TABLET | ORAL | Status: AC | PRN

## 2011-08-10 MED ORDER — CIPROFLOXACIN HCL 500 MG PO TABS
500.0000 mg | ORAL_TABLET | Freq: Once | ORAL | Status: AC
  Administered 2011-08-10: 500 mg via ORAL

## 2011-08-10 MED ORDER — CIPROFLOXACIN HCL 500 MG PO TABS
ORAL_TABLET | ORAL | Status: AC
  Administered 2011-08-10: 500 mg via ORAL
  Filled 2011-08-10: qty 1

## 2011-08-10 MED ORDER — DIFLUNISAL 500 MG PO TABS: 250.0000 mg | ORAL_TABLET | Freq: Two times a day (BID) | ORAL | Status: DC

## 2011-08-10 MED ORDER — DIAZEPAM 5 MG PO TABS
ORAL_TABLET | ORAL | Status: AC
  Administered 2011-08-10: 10 mg
  Filled 2011-08-10: qty 2

## 2011-08-10 MED ORDER — WARFARIN SODIUM 2.5 MG PO TABS: 2.5000 mg | ORAL_TABLET | Freq: Every day | ORAL | Status: DC

## 2011-08-10 MED ORDER — DIPHENHYDRAMINE HCL 25 MG PO TABS
25.0000 mg | ORAL_TABLET | Freq: Once | ORAL | Status: AC
  Administered 2011-08-10: 25 mg via ORAL
  Filled 2011-08-10: qty 1

## 2011-08-10 NOTE — H&P (Signed)
Chief Complaint  Right nephrolithiasis   History of Present Illness   The patient returns to clinic again today to further discuss shockwave lithotripsy. She has a right upper pole 13mm stone.  We discussed the risks, benefits, alternatives, and likelihood of achieving her goals.  Today we discussed the management of urinary stones. These options include observation, ureteroscopy, shockwave lithotripsy, and PCNL. We discussed which options are relevant to these particular stones. We discussed the natural history of stones as well as the complications of untreated stones and the impact on quality of life without treatment as well as with each of the above listed treatments. We also discussed the efficacy of each treatment in its ability to clear the stone burden. With any of these management options I discussed the signs and symptoms of infection and the need for emergent treatment should these be experienced. For each option we discussed the ability of each procedure to clear the patient of their stone burden.  For observation I described the risks which include but are not limited to silent renal damage, life-threatening infection, need for emergent surgery, failure to pass stone, and pain.  For ureteroscopy I described the risks which include heart attack, stroke, pulmonary embolus, death, bleeding, infection, damage to contiguous structures, positioning injury, ureteral stricture, ureteral avulsion, ureteral injury, need for ureteral stent, inability to perform ureteroscopy, need for an interval procedure, inability to clear stone burden, stent discomfort and pain.  For shockwave lithotripsy I described the risks which include arrhythmia, kidney contusion, kidney hemorrhage, need for transfusion, long-term risk of diabetes or hypertension, back discomfort, flank ecchymosis, flank abrasion, inability to break up stone, inability to pass stone fragments, Steinstrasse, infection associated with  obstructing stones, need for different surgical procedure, need for repeat shockwave lithotripsy, and death.  For PCNL I described the risks including heart attack, sure, pulmonary embolus, death, positioning injury, pneumothorax, hydrothorax, need for chest tube, inability to clear stone burden, renal laceration, arterial venous fistula or malformation, need for embolization of kidney, loss of kidney or renal function, need for repeat procedure, need for prolonged nephrostomy tube, ureteral avulsion, fistula.  Her INR today is 1.15.   Past Medical History Problems  1. History of  Arthritis V13.4 2. History of  Atrial Fibrillation 427.31 3. History of  Cardiomyopathy 425.4 4. History of  Hypercholesterolemia 272.0 5. History of  Spinal Stenosis 724.00  Surgical History Problems  1. History of  Appendectomy 2. History of  Hemorrhoidectomy 3. History of  Neuroplasty Median Nerve At Carpal Tunnel  Current Meds 1. Align; Therapy: (Recorded:20Feb2008) to 2. Calcium TABS; Therapy: (Recorded:02Oct2012) to 3. Carafate 1 GM/10ML Oral Suspension; Therapy: 18Mar2011 to 4. Coumadin 2.5 MG Oral Tablet; Therapy: 15Sep2011 to 5. Diflunisal 500 MG Oral Tablet; Therapy: 27Feb2012 to 6. Furosemide 40 MG Oral Tablet; Therapy: 14Apr2012 to 7. Gabapentin TABS; Therapy: (Recorded:24Aug2009) to 8. Imodium CAPS; Therapy: (Recorded:21Sep2011) to 9. Iron TABS; Therapy: (Recorded:30Aug2012) to 10. Multiple Vitamin TABS; Therapy: (Recorded:30Aug2012) to 11. NexIUM 40 MG Oral Capsule Delayed Release; Therapy: 27Dec2011 to 12. Polyethylene Glycol 3350 Oral Powder; Therapy: 21Jun2011 to 13. Potassium Chloride CR 10 MEQ Oral Tablet Extended Release; Therapy: 19Nov2011 to 14. Vitamin B-12 SOLN; Therapy: (Recorded:27Jun2011) to  Allergies Medication  1. Lipitor TABS 2. Zocor TABS  Family History Problems  1. Family history of  Family Health Status Number Of Children 1 son 1 daughter 2. Paternal history  of  Urologic Disorder V18.7 kidney stones  Social History Problems  1. Alcohol Use occasionally 2. Caffeine Use  2 3. Marital History - Divorced 4. Occupation: retired  Review of Systems Constitutional, skin, eye, otolaryngeal, pulmonary, endocrine, neurological and psychiatric system(s) were reviewed and pertinent findings if present are noted.  Gastrointestinal: heartburn, but no nausea and no vomiting.  Hematologic/Lymphatic: a tendency to easily bruise, but no swollen glands.  Cardiovascular: leg swelling, but no chest pain.  Musculoskeletal: joint pain, but no back pain.     Physical Exam Constitutional: Well nourished and well developed.  ENT:. The ears and nose are normal in appearance. The oropharynx is normal.  Neck: The appearance of the neck is normal and no neck mass is present.  Pulmonary: No respiratory distress and normal respiratory rhythm and effort.  Cardiovascular: Heart rate and rhythm are normal . No peripheral edema. History of atrial fibrillation, but she is in normal rhythm today.  Abdomen: The abdomen is soft and nontender. No masses are palpated. The abdomen is no rebound. No CVA tenderness.  Lymphatics: The posterior cervical and supraclavicular nodes are not enlarged or tender.  Skin: Normal skin turgor and no visible rash.  Neuro/Psych:. Mood and affect are appropriate. No focal sensory deficits.     Assessment Right Nephrolithiasis   Plan Continue with right shockwave lithotripsy.  Patient has been cleared by Dr. Antoine Poche to be off her Coumadin prior to the SWL.  Followup 3 days following lithotripsy with a renal ultrasound on the right.

## 2011-08-10 NOTE — Op Note (Signed)
PreOp: Right nephrolithiasis Post-Op: Same Surgeon: Margarita Grizzle, MD  Procedure: Right shock-wave lithotripsy  EBL: None Complications: None Drains: None Findings: Right nephrolithiasis  See Scanned Operative Report for details of shock wave lithotripsy.

## 2011-08-11 ENCOUNTER — Encounter (HOSPITAL_COMMUNITY): Payer: Self-pay

## 2011-08-13 ENCOUNTER — Other Ambulatory Visit (HOSPITAL_COMMUNITY): Payer: Self-pay | Admitting: Urology

## 2011-08-13 ENCOUNTER — Ambulatory Visit (HOSPITAL_COMMUNITY)
Admit: 2011-08-13 | Discharge: 2011-08-13 | Disposition: A | Discharge status: Home / Self Care | Payer: Medicare Other | Attending: Urology | Admitting: Urology

## 2011-08-13 DIAGNOSIS — N2889 Other specified disorders of kidney and ureter: Secondary | ICD-10-CM

## 2011-08-13 DIAGNOSIS — N23 Unspecified renal colic: Secondary | ICD-10-CM | POA: Insufficient documentation

## 2011-08-13 DIAGNOSIS — T8619 Other complication of kidney transplant: Secondary | ICD-10-CM

## 2011-08-17 ENCOUNTER — Ambulatory Visit (HOSPITAL_COMMUNITY)
Admission: RE | Admit: 2011-08-17 | Discharge: 2011-08-17 | Disposition: A | Discharge status: Home / Self Care | Payer: Medicare Other | Source: Ambulatory Visit | Attending: Urology | Admitting: Urology

## 2011-08-17 DIAGNOSIS — N1330 Unspecified hydronephrosis: Secondary | ICD-10-CM | POA: Insufficient documentation

## 2011-08-17 DIAGNOSIS — T8619 Other complication of kidney transplant: Secondary | ICD-10-CM

## 2011-08-17 DIAGNOSIS — N2889 Other specified disorders of kidney and ureter: Secondary | ICD-10-CM | POA: Insufficient documentation

## 2011-08-17 DIAGNOSIS — N201 Calculus of ureter: Secondary | ICD-10-CM | POA: Insufficient documentation

## 2011-08-21 ENCOUNTER — Encounter: Payer: Medicare Other | Admitting: *Deleted

## 2011-08-24 ENCOUNTER — Ambulatory Visit (INDEPENDENT_AMBULATORY_CARE_PROVIDER_SITE_OTHER): Payer: Medicare Other | Admitting: *Deleted

## 2011-08-24 DIAGNOSIS — I498 Other specified cardiac arrhythmias: Secondary | ICD-10-CM

## 2011-08-24 DIAGNOSIS — Z7901 Long term (current) use of anticoagulants: Secondary | ICD-10-CM

## 2011-08-24 LAB — POCT INR: INR: 1.5

## 2011-08-25 ENCOUNTER — Other Ambulatory Visit: Payer: Self-pay | Admitting: Cardiology

## 2011-08-25 HISTORY — PX: CATARACT EXTRACTION W/ INTRAOCULAR LENS  IMPLANT, BILATERAL: SHX1307

## 2011-09-01 ENCOUNTER — Ambulatory Visit (INDEPENDENT_AMBULATORY_CARE_PROVIDER_SITE_OTHER): Payer: Medicare Other | Admitting: *Deleted

## 2011-09-01 ENCOUNTER — Telehealth: Payer: Self-pay | Admitting: Cardiology

## 2011-09-01 DIAGNOSIS — I498 Other specified cardiac arrhythmias: Secondary | ICD-10-CM

## 2011-09-01 DIAGNOSIS — R531 Weakness: Secondary | ICD-10-CM

## 2011-09-01 DIAGNOSIS — Z7901 Long term (current) use of anticoagulants: Secondary | ICD-10-CM

## 2011-09-01 LAB — POCT INR: INR: 1.9

## 2011-09-01 NOTE — Telephone Encounter (Signed)
New Msg: pt calling wanting to discuss with nurse about a couple referrals. Please return pt call to discuss further.

## 2011-09-01 NOTE — Telephone Encounter (Signed)
Patient wants to discuss some referrals with Pam, she would like to be call the earliest convenient.

## 2011-09-09 ENCOUNTER — Ambulatory Visit (INDEPENDENT_AMBULATORY_CARE_PROVIDER_SITE_OTHER): Payer: Medicare Other | Admitting: *Deleted

## 2011-09-09 DIAGNOSIS — Z7901 Long term (current) use of anticoagulants: Secondary | ICD-10-CM

## 2011-09-09 DIAGNOSIS — I498 Other specified cardiac arrhythmias: Secondary | ICD-10-CM

## 2011-09-09 LAB — POCT INR: INR: 1.8

## 2011-09-23 ENCOUNTER — Ambulatory Visit (INDEPENDENT_AMBULATORY_CARE_PROVIDER_SITE_OTHER): Payer: Medicare Other | Admitting: *Deleted

## 2011-09-23 DIAGNOSIS — I498 Other specified cardiac arrhythmias: Secondary | ICD-10-CM

## 2011-09-23 DIAGNOSIS — Z7901 Long term (current) use of anticoagulants: Secondary | ICD-10-CM

## 2011-09-23 LAB — POCT INR: INR: 2.3

## 2011-09-23 MED ORDER — WARFARIN SODIUM 2.5 MG PO TABS: ORAL_TABLET | ORAL | Status: DC

## 2011-09-24 ENCOUNTER — Encounter: Payer: Self-pay | Admitting: Cardiology

## 2011-09-25 ENCOUNTER — Other Ambulatory Visit: Payer: Self-pay | Admitting: Urology

## 2011-09-25 HISTORY — PX: CYSTOSCOPY: SUR368

## 2011-09-29 ENCOUNTER — Other Ambulatory Visit: Payer: Self-pay | Admitting: Cardiology

## 2011-09-30 ENCOUNTER — Other Ambulatory Visit: Payer: Self-pay | Admitting: Urology

## 2011-09-30 ENCOUNTER — Telehealth: Payer: Self-pay | Admitting: Cardiology

## 2011-09-30 ENCOUNTER — Encounter (HOSPITAL_BASED_OUTPATIENT_CLINIC_OR_DEPARTMENT_OTHER): Payer: Self-pay | Admitting: *Deleted

## 2011-09-30 NOTE — Telephone Encounter (Signed)
Pt having surgery on Monday and needs to stop coumadin tonight

## 2011-09-30 NOTE — Telephone Encounter (Signed)
Per Dr Rollene Rotunda - OK for patient to stop Coumadin for upcoming procedure and restart as soon as possible.

## 2011-09-30 NOTE — Progress Notes (Signed)
REVIEWED CHART IN EPIC , INCLUDING CAROTID DUPLEX, ECHO, AND [facility] OFFICE NOTE FROM 08-07-11. PAGED AND SPOKE TO MDA ON CALL DR EWELL, REVIEWED PT HX WITH HIM STATES WITH EF 30% AND BILATERAL MOD. CAROTID STENOSIS PT SHOULD BE DONE AT MAIN OR. LM W/ CHASITY, OR SCHEDULER FOR DR WOODRUFF.

## 2011-10-01 ENCOUNTER — Other Ambulatory Visit: Payer: Self-pay | Admitting: Urology

## 2011-10-05 ENCOUNTER — Encounter (HOSPITAL_BASED_OUTPATIENT_CLINIC_OR_DEPARTMENT_OTHER): Admission: RE | Payer: Self-pay | Source: Ambulatory Visit

## 2011-10-05 ENCOUNTER — Other Ambulatory Visit: Payer: Self-pay | Admitting: Cardiology

## 2011-10-05 ENCOUNTER — Ambulatory Visit (HOSPITAL_BASED_OUTPATIENT_CLINIC_OR_DEPARTMENT_OTHER): Admission: RE | Admit: 2011-10-05 | Payer: Medicare Other | Source: Ambulatory Visit | Admitting: Urology

## 2011-10-05 DIAGNOSIS — I6529 Occlusion and stenosis of unspecified carotid artery: Secondary | ICD-10-CM

## 2011-10-05 HISTORY — DX: Unspecified osteoarthritis, unspecified site: M19.90

## 2011-10-05 HISTORY — DX: Heart failure, unspecified: I50.9

## 2011-10-05 HISTORY — DX: Cardiomyopathy, unspecified: I42.9

## 2011-10-05 HISTORY — DX: Occlusion and stenosis of bilateral carotid arteries: I65.23

## 2011-10-05 SURGERY — CYSTOSCOPY WITH URETEROSCOPY
Anesthesia: General | Laterality: Right

## 2011-10-07 ENCOUNTER — Ambulatory Visit (INDEPENDENT_AMBULATORY_CARE_PROVIDER_SITE_OTHER): Payer: Medicare Other | Admitting: *Deleted

## 2011-10-07 ENCOUNTER — Encounter (INDEPENDENT_AMBULATORY_CARE_PROVIDER_SITE_OTHER): Payer: Medicare Other | Admitting: Cardiology

## 2011-10-07 ENCOUNTER — Encounter: Payer: Medicare Other | Admitting: Cardiology

## 2011-10-07 ENCOUNTER — Ambulatory Visit (INDEPENDENT_AMBULATORY_CARE_PROVIDER_SITE_OTHER): Payer: Medicare Other | Admitting: Cardiology

## 2011-10-07 ENCOUNTER — Encounter: Payer: Self-pay | Admitting: Cardiology

## 2011-10-07 DIAGNOSIS — R609 Edema, unspecified: Secondary | ICD-10-CM

## 2011-10-07 DIAGNOSIS — Z7901 Long term (current) use of anticoagulants: Secondary | ICD-10-CM

## 2011-10-07 DIAGNOSIS — I6529 Occlusion and stenosis of unspecified carotid artery: Secondary | ICD-10-CM

## 2011-10-07 DIAGNOSIS — I428 Other cardiomyopathies: Secondary | ICD-10-CM

## 2011-10-07 DIAGNOSIS — I498 Other specified cardiac arrhythmias: Secondary | ICD-10-CM

## 2011-10-07 DIAGNOSIS — I10 Essential (primary) hypertension: Secondary | ICD-10-CM

## 2011-10-07 NOTE — Assessment & Plan Note (Signed)
She is very good at managing this with PRN Lasix dosing. I have again encouraged conservative therapy such as salt restriction in keeping her feet elevated.

## 2011-10-07 NOTE — Assessment & Plan Note (Signed)
Her volume status seems to be the same. However, given the CT findings suggesting moderate pericardial effusion I will repeat an echocardiogram. I don't suspect that this is hemodynamically significant but further treatment will be based on these results.

## 2011-10-07 NOTE — Progress Notes (Signed)
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  Since I last saw her she has urologic procedures. She apparently is having another of these. As part of an evaluation she brings a CT to look at renal calculi. This mentions left pleural thickening or effusion. It also mentions a moderate pericardial effusion. The patient has had no new cardiovascular symptoms. She does have her chronic dyspnea but this is unchanged. She's not having any new shortness of breath, PND or orthopnea. She has chronic lower extremity swelling. She's had no chest pressure. She is watching her salt.  Allergies  Allergen Reactions  . Statins Other (See Comments)    LEG CRAMPS     Current Outpatient Prescriptions  Medication Sig Dispense Refill  . Alpha-D-Galactosidase (BEANO PO) Take 1-2 tablets by mouth 2 (two) times daily as needed. GAS        . Calcium Citrate-Vitamin D (CITRACAL + D PO) Take 2 tablets by mouth daily.        . diflunisal (DOLOBID) 500 MG TABS Take 0.5 tablets (250 mg total) by mouth 2 (two) times daily.  1 tablet  0  . diflunisal (DOLOBID) 500 MG TABS TAKE ONE HALF (1/2) TABLET(S) TWICE DAILY  30 tablet  0  . docusate sodium (COLACE) 100 MG capsule Take 100 mg by mouth 2 (two) times daily.        Marland Kitchen esomeprazole (NEXIUM) 40 MG capsule Take 40 mg by mouth 2 (two) times daily.        . ferrous sulfate 325 (65 FE) MG tablet Take 325 mg by mouth daily with breakfast.        . furosemide (LASIX) 20 MG tablet Take 20 mg by mouth daily as needed. SWELLING.  PT CAN TAKE UP TO 120 MG. PT DOES HAVE A RX FOR BOTH 40 MG AND 20 MG.        . furosemide (LASIX) 40 MG tablet Take 40-120 mg by mouth daily as needed. SWELLING.  PT CAN TAKE UP TO 120 MG. PT DOES HAVE A RX FOR BOTH 40 MG AND 20 MG.        . gabapentin (NEURONTIN) 100 MG capsule Take 100 mg by mouth every 4 (four) hours as needed. PAIN        . Hypromellose (GENTEAL) 0.3 % SOLN Place 1 drop into both eyes 2 (two) times daily as  needed. DRY EYES        . loperamide (IMODIUM) 2 MG capsule Take 2 mg by mouth 2 (two) times daily.        . Multiple Vitamins-Minerals (MULTIVITAMINS THER. W/MINERALS) TABS Take 1 tablet by mouth daily.        . Nutritional Supplements (CARNATION INSTANT BREAKFAST PO) Take 1 Package by mouth 2 (two) times daily.        . potassium chloride (K-DUR) 10 MEQ tablet Take 10-30 mEq by mouth daily as needed. FLUID        . Probiotic Product (ALIGN PO) Take 1 capsule by mouth daily.       . Simethicone 180 MG CAPS Take 1-2 capsules by mouth 2 (two) times daily as needed. GAS        . vitamin B-12 (CYANOCOBALAMIN) 1000 MCG tablet Inject 1,000 mcg as directed every 30 (thirty) days.        Marland Kitchen warfarin (COUMADIN) 2.5 MG tablet Take as directed by the Anticoagulation Clinic  45 tablet  3    Past Medical History  Diagnosis Date  . Syncope   . Edema   . Dizziness   . Hemorrhoids, internal   . Lactose intolerance   . Depression   . Anxiety   . IBS (irritable bowel syndrome)   . Dyslipidemia   . Hypertension   . Constipation   . Amyloid disease   . Chronic kidney disease     kidney stone  . CHF (congestive heart failure) SECONDARY TO AMYLIODOSIS  . Coronary artery disease CARDIOLOGIST- DR Dhani Dannemiller-- LAST VISIT 08-07-11 IN EPIC  . S/P hemorrhoidectomy   . Right ureteral stone   . Arthritis   . Carotid stenosis, bilateral PER DUPLEX  04-10-2011    RICA  40-59%/  LICA 60-79%  . Cardiomyopathy secondary EF 30%  . Atrial fibrillation   . Hypercholesterolemia   . Spinal stenosis     Past Surgical History  Procedure Date  . Hemorrhoid surgery 2002  . Carpal tunnel release 2005    RIGHT  . Cysto/ bladder bx and fulgeration 02-21-2007  . Right sural nerve bx/ right gastrocemius muscle bx 09-25-2009  . Breast biopsy 1992  . Appendectomy 1961  . Extracorporeal shock wave lithotripsy 08-10-2011    RIGHT  . Transthoracic echocardiogram 11-24-2010    LVEF 35% WITH DIFFUSE HYPOKINESIS, WORSE  IN THE BASE/MID ANTERIOR WALL AND LATERAL WALL/ BASAL INFERIOR/ INFEROSEPTAL AKINESIS/  CAVITY SIZE NORMAL/ WALL THICKNESS WAS INCREASED IN A PATTERN OF SEVERE LVH/ PAPAMETERS CONSISTENT WITH AN IRREVERSIBLE RESTRICTIVE PATTERN/  GRADE 4 DIASTOLIC DYSFUNCTION/ MILD TO MOD. MV REGUR./ BIL. ATRIUM MOD. DILATED/ RVSF MOD. REDUCED/ SMALL PERICARDIAL EFFUSION    ROS:  As stated in the HPI and negative for all other systems.  PHYSICAL EXAM BP 100/60  Pulse 66  Ht 5\' 5"  (1.651 m)  Wt 130 lb (58.968 kg)  BMI 21.63 kg/m2  LMP 08/04/1991 PHYSICAL EXAM GEN:  No distress NECK:  Jugular venous distention at 90 degrees, waveform within normal limits, carotid upstroke brisk and symmetric, no bruits, no thyromegaly LYMPHATICS:  No cervical adenopathy LUNGS:  Clear to auscultation bilaterally BACK:  No CVA tenderness CHEST:  Unremarkable HEART:  S1 and S2 within normal limits, no S3, no S4, no clicks, no rubs, no murmurs ABD:  Positive bowel sounds normal in frequency in pitch, no bruits, no rebound, no guarding, unable to assess midline mass or bruit with the patient seated. EXT:  2 plus pulses throughout, moderate edema, no cyanosis no clubbing SKIN:  No rashes no nodules NEURO:  Cranial nerves II through XII grossly intact, motor grossly intact throughout PSYCH:  Cognitively intact, oriented to person place and time  EKG:  Sinus rhythm, rate 68, left bundle branch block 10/07/2011   ASSESSMENT AND PLAN

## 2011-10-07 NOTE — Assessment & Plan Note (Signed)
Her blood pressure actually runs low but she tolerates this. No change in therapy is indicated.

## 2011-10-07 NOTE — Assessment & Plan Note (Addendum)
She has had no new symptomatic dysrhythmias. No change in therapy is indicated.  She can hold anticoagulation for any planned procedures. She is at acceptable risk for the urologic procedure that has been discussed.

## 2011-10-07 NOTE — Patient Instructions (Signed)
The current medical regimen is effective;  continue present plan and medications.  Your physician has requested that you have an echocardiogram. Echocardiography is a painless test that uses sound waves to create images of your heart. It provides your doctor with information about the size and shape of your heart and how well your heart's chambers and valves are working. This procedure takes approximately one hour. There are no restrictions for this procedure.  Follow up with Dr Hochrein in 2 months. 

## 2011-10-14 ENCOUNTER — Telehealth: Payer: Self-pay | Admitting: Cardiology

## 2011-10-14 NOTE — Telephone Encounter (Signed)
Calling to f/u on surgical clearance for patient

## 2011-10-14 NOTE — Telephone Encounter (Signed)
Left message that pt was cleared for surgery on 2/13.  Requested they call back if they have further needs

## 2011-10-14 NOTE — Telephone Encounter (Signed)
Clearance faxed again to [facility] [phone]

## 2011-10-15 ENCOUNTER — Other Ambulatory Visit: Payer: Self-pay

## 2011-10-15 ENCOUNTER — Encounter: Payer: Medicare Other | Admitting: *Deleted

## 2011-10-15 ENCOUNTER — Ambulatory Visit (HOSPITAL_COMMUNITY): Payer: Medicare Other | Attending: Cardiology

## 2011-10-15 ENCOUNTER — Ambulatory Visit (INDEPENDENT_AMBULATORY_CARE_PROVIDER_SITE_OTHER): Payer: Medicare Other

## 2011-10-15 DIAGNOSIS — I10 Essential (primary) hypertension: Secondary | ICD-10-CM | POA: Insufficient documentation

## 2011-10-15 DIAGNOSIS — I428 Other cardiomyopathies: Secondary | ICD-10-CM | POA: Insufficient documentation

## 2011-10-15 DIAGNOSIS — Z7901 Long term (current) use of anticoagulants: Secondary | ICD-10-CM

## 2011-10-15 DIAGNOSIS — I079 Rheumatic tricuspid valve disease, unspecified: Secondary | ICD-10-CM | POA: Insufficient documentation

## 2011-10-15 DIAGNOSIS — I059 Rheumatic mitral valve disease, unspecified: Secondary | ICD-10-CM | POA: Insufficient documentation

## 2011-10-15 DIAGNOSIS — I498 Other specified cardiac arrhythmias: Secondary | ICD-10-CM

## 2011-10-15 DIAGNOSIS — I509 Heart failure, unspecified: Secondary | ICD-10-CM

## 2011-10-15 DIAGNOSIS — E785 Hyperlipidemia, unspecified: Secondary | ICD-10-CM | POA: Insufficient documentation

## 2011-10-15 DIAGNOSIS — I379 Nonrheumatic pulmonary valve disorder, unspecified: Secondary | ICD-10-CM | POA: Insufficient documentation

## 2011-10-16 ENCOUNTER — Other Ambulatory Visit: Payer: Self-pay | Admitting: Urology

## 2011-10-16 ENCOUNTER — Encounter (HOSPITAL_COMMUNITY): Payer: Self-pay | Admitting: *Deleted

## 2011-10-16 NOTE — Pre-Procedure Instructions (Signed)
20 Miranda [name]  10/16/2011   Your procedure is scheduled on:  10/19/11  Report to [facility] at 5:30 AM.  Call this number if you have problems the morning of surgery: [phone]   Remember:   Do not eat food:After Midnight.    Take these medicines the morning of surgery with A SIP OF WATER: cipro / imodium / neurontin / omeprazole   Do not wear jewelry, make-up or nail polish.  Do not wear lotions, powders, or perfumes.   Do not shave underarms or legs 48 hours prior to surgery (men may shave face)  Do not bring valuables to the hospital.  Contacts, dentures or bridgework may not be worn into surgery.  Leave suitcase in the car. After surgery it may be brought to your room.  For patients admitted to the hospital, checkout time is 11:00 AM the day of discharge.   Patients discharged the day of surgery will not be allowed to drive home.  Name and phone number of your driver:   Special Instructions: CHG Shower Use Special Wash: 1/2 bottle night before surgery and 1/2 bottle morning of surgery.   Please read over the following fact sheets that you were given: MRSA Information  [address] Miranda [name]  10/16/2011

## 2011-10-19 ENCOUNTER — Ambulatory Visit (HOSPITAL_COMMUNITY): Payer: Medicare Other | Admitting: *Deleted

## 2011-10-19 ENCOUNTER — Encounter (HOSPITAL_COMMUNITY)
Admission: RE | Disposition: A | Discharge status: Home / Self Care | Payer: Self-pay | Source: Ambulatory Visit | Attending: Urology

## 2011-10-19 ENCOUNTER — Other Ambulatory Visit: Payer: Self-pay

## 2011-10-19 ENCOUNTER — Encounter (HOSPITAL_COMMUNITY): Payer: Self-pay | Admitting: *Deleted

## 2011-10-19 ENCOUNTER — Ambulatory Visit (HOSPITAL_COMMUNITY): Admission: RE | Admit: 2011-10-19 | Payer: Medicare Other | Source: Ambulatory Visit | Admitting: Urology

## 2011-10-19 ENCOUNTER — Inpatient Hospital Stay (HOSPITAL_COMMUNITY)
Admission: RE | Admit: 2011-10-19 | Discharge: 2011-10-20 | DRG: 694 | Disposition: A | Discharge status: Home / Self Care | Payer: Medicare Other | Source: Ambulatory Visit | Attending: Urology | Admitting: Urology

## 2011-10-19 ENCOUNTER — Ambulatory Visit (HOSPITAL_COMMUNITY): Payer: Medicare Other

## 2011-10-19 DIAGNOSIS — N189 Chronic kidney disease, unspecified: Secondary | ICD-10-CM | POA: Diagnosis present

## 2011-10-19 DIAGNOSIS — I129 Hypertensive chronic kidney disease with stage 1 through stage 4 chronic kidney disease, or unspecified chronic kidney disease: Secondary | ICD-10-CM | POA: Diagnosis present

## 2011-10-19 DIAGNOSIS — I428 Other cardiomyopathies: Secondary | ICD-10-CM | POA: Diagnosis present

## 2011-10-19 DIAGNOSIS — E7800 Pure hypercholesterolemia, unspecified: Secondary | ICD-10-CM | POA: Diagnosis present

## 2011-10-19 DIAGNOSIS — I4891 Unspecified atrial fibrillation: Secondary | ICD-10-CM

## 2011-10-19 DIAGNOSIS — I2510 Atherosclerotic heart disease of native coronary artery without angina pectoris: Secondary | ICD-10-CM | POA: Diagnosis present

## 2011-10-19 DIAGNOSIS — I509 Heart failure, unspecified: Secondary | ICD-10-CM | POA: Diagnosis present

## 2011-10-19 DIAGNOSIS — I5022 Chronic systolic (congestive) heart failure: Secondary | ICD-10-CM

## 2011-10-19 DIAGNOSIS — N201 Calculus of ureter: Principal | ICD-10-CM

## 2011-10-19 DIAGNOSIS — E785 Hyperlipidemia, unspecified: Secondary | ICD-10-CM | POA: Diagnosis present

## 2011-10-19 HISTORY — DX: Myoneural disorder, unspecified: G70.9

## 2011-10-19 LAB — CBC
HCT: 38.2 % (ref 36.0–46.0)
MCH: 31.2 pg (ref 26.0–34.0)
MCV: 95.3 fL (ref 78.0–100.0)
RDW: 15.1 % (ref 11.5–15.5)
WBC: 5.2 10*3/uL (ref 4.0–10.5)

## 2011-10-19 LAB — BASIC METABOLIC PANEL
BUN: 19 mg/dL (ref 6–23)
CO2: 30 mEq/L (ref 19–32)
Calcium: 9.4 mg/dL (ref 8.4–10.5)
Calcium: 9.3 mg/dL (ref 8.4–10.5)
Chloride: 99 mEq/L (ref 96–112)
Chloride: 99 mEq/L (ref 96–112)
Creatinine, Ser: 0.86 mg/dL (ref 0.50–1.10)
Creatinine, Ser: 0.8 mg/dL (ref 0.50–1.10)
GFR calc Af Amer: 76 mL/min — ABNORMAL LOW (ref >90)
Glucose, Bld: 110 mg/dL — ABNORMAL HIGH (ref 70–99)

## 2011-10-19 LAB — SURGICAL PCR SCREEN
MRSA, PCR: NEGATIVE
Staphylococcus aureus: NEGATIVE

## 2011-10-19 SURGERY — LITHOTRIPSY, ESWL
Anesthesia: LOCAL | Laterality: Right

## 2011-10-19 SURGERY — CYSTOSCOPY, WITH STENT INSERTION
Anesthesia: General | Site: Ureter | Laterality: Right | Wound class: Clean Contaminated

## 2011-10-19 MED ORDER — DEXTROSE 5 % IV SOLN: 1.0000 g | Freq: Once | INTRAVENOUS | Status: DC

## 2011-10-19 MED ORDER — LIDOCAINE HCL (CARDIAC) 20 MG/ML IV SOLN
INTRAVENOUS | Status: DC | PRN
  Administered 2011-10-19: 60 mg via INTRAVENOUS

## 2011-10-19 MED ORDER — FUROSEMIDE 40 MG PO TABS
40.0000 mg | ORAL_TABLET | Freq: Every day | ORAL | Status: DC
  Filled 2011-10-19 (×2): qty 1

## 2011-10-19 MED ORDER — MIDAZOLAM HCL 5 MG/5ML IJ SOLN
INTRAMUSCULAR | Status: DC | PRN
  Administered 2011-10-19 (×2): 0.5 mg via INTRAVENOUS

## 2011-10-19 MED ORDER — DEXTROSE 5 % IV SOLN
1.0000 g | Freq: Once | INTRAVENOUS | Status: DC
  Filled 2011-10-19: qty 10

## 2011-10-19 MED ORDER — LACTATED RINGERS IV SOLN
INTRAVENOUS | Status: DC | PRN
  Administered 2011-10-19: 07:00:00 via INTRAVENOUS

## 2011-10-19 MED ORDER — DOCUSATE SODIUM 100 MG PO CAPS
100.0000 mg | ORAL_CAPSULE | Freq: Two times a day (BID) | ORAL | Status: DC
  Administered 2011-10-19 – 2011-10-20 (×3): 100 mg via ORAL
  Filled 2011-10-19 (×6): qty 1

## 2011-10-19 MED ORDER — DEXTROSE 5 % IV SOLN
1.0000 g | INTRAVENOUS | Status: DC
  Administered 2011-10-20: 1 g via INTRAVENOUS
  Filled 2011-10-19 (×2): qty 10

## 2011-10-19 MED ORDER — SENNOSIDES-DOCUSATE SODIUM 8.6-50 MG PO TABS
1.0000 | ORAL_TABLET | Freq: Two times a day (BID) | ORAL | Status: DC
  Administered 2011-10-19 – 2011-10-20 (×3): 1 via ORAL
  Filled 2011-10-19 (×6): qty 1

## 2011-10-19 MED ORDER — METOPROLOL SUCCINATE ER 25 MG PO TB24
25.0000 mg | ORAL_TABLET | Freq: Two times a day (BID) | ORAL | Status: DC
  Administered 2011-10-19 – 2011-10-20 (×2): 25 mg via ORAL
  Filled 2011-10-19 (×6): qty 1

## 2011-10-19 MED ORDER — DEXTROSE 5 % IV SOLN
1.0000 g | INTRAVENOUS | Status: DC | PRN
  Administered 2011-10-19: 1 g via INTRAVENOUS

## 2011-10-19 MED ORDER — ONDANSETRON HCL 4 MG/2ML IJ SOLN
INTRAMUSCULAR | Status: DC | PRN
  Administered 2011-10-19: 1 mg via INTRAVENOUS
  Administered 2011-10-19: 2 mg via INTRAVENOUS
  Administered 2011-10-19: 1 mg via INTRAVENOUS

## 2011-10-19 MED ORDER — FERROUS SULFATE 325 (65 FE) MG PO TABS
325.0000 mg | ORAL_TABLET | Freq: Every day | ORAL | Status: DC
  Administered 2011-10-20: 325 mg via ORAL
  Filled 2011-10-19 (×2): qty 1

## 2011-10-19 MED ORDER — LOPERAMIDE HCL 2 MG PO CAPS
2.0000 mg | ORAL_CAPSULE | Freq: Two times a day (BID) | ORAL | Status: DC
  Filled 2011-10-19 (×4): qty 1

## 2011-10-19 MED ORDER — NAPROXEN 250 MG PO TABS
250.0000 mg | ORAL_TABLET | Freq: Two times a day (BID) | ORAL | Status: DC
  Administered 2011-10-20: 250 mg via ORAL
  Filled 2011-10-19 (×5): qty 1

## 2011-10-19 MED ORDER — GABAPENTIN 100 MG PO CAPS
100.0000 mg | ORAL_CAPSULE | ORAL | Status: DC | PRN
  Administered 2011-10-19: 100 mg via ORAL
  Filled 2011-10-19: qty 1

## 2011-10-19 MED ORDER — ENOXAPARIN SODIUM 40 MG/0.4ML ~~LOC~~ SOLN
40.0000 mg | SUBCUTANEOUS | Status: DC
  Administered 2011-10-19: 40 mg via SUBCUTANEOUS
  Filled 2011-10-19 (×3): qty 0.4

## 2011-10-19 MED ORDER — PROMETHAZINE HCL 25 MG/ML IJ SOLN: 6.2500 mg | INTRAMUSCULAR | Status: DC | PRN

## 2011-10-19 MED ORDER — DEXAMETHASONE SODIUM PHOSPHATE 10 MG/ML IJ SOLN
INTRAMUSCULAR | Status: DC | PRN
  Administered 2011-10-19: 10 mg via INTRAVENOUS

## 2011-10-19 MED ORDER — SODIUM CHLORIDE 0.9 % IV SOLN
INTRAVENOUS | Status: DC | PRN
  Administered 2011-10-19: 07:00:00

## 2011-10-19 MED ORDER — PANTOPRAZOLE SODIUM 40 MG PO TBEC
40.0000 mg | DELAYED_RELEASE_TABLET | Freq: Every day | ORAL | Status: DC
  Administered 2011-10-19 – 2011-10-20 (×2): 40 mg via ORAL
  Filled 2011-10-19 (×2): qty 1

## 2011-10-19 MED ORDER — FUROSEMIDE 40 MG PO TABS
40.0000 mg | ORAL_TABLET | Freq: Every day | ORAL | Status: DC
  Administered 2011-10-19 – 2011-10-20 (×2): 40 mg via ORAL
  Filled 2011-10-19 (×3): qty 1

## 2011-10-19 MED ORDER — ACETAMINOPHEN 10 MG/ML IV SOLN
INTRAVENOUS | Status: DC | PRN
  Administered 2011-10-19: 1000 mg via INTRAVENOUS

## 2011-10-19 MED ORDER — PHENAZOPYRIDINE HCL 100 MG PO TABS: 100.0000 mg | ORAL_TABLET | Freq: Three times a day (TID) | ORAL | Status: AC | PRN

## 2011-10-19 MED ORDER — SIMETHICONE 80 MG PO CHEW
80.0000 mg | CHEWABLE_TABLET | Freq: Four times a day (QID) | ORAL | Status: DC | PRN
  Filled 2011-10-19: qty 1

## 2011-10-19 MED ORDER — POTASSIUM CHLORIDE CRYS ER 20 MEQ PO TBCR
20.0000 meq | EXTENDED_RELEASE_TABLET | Freq: Two times a day (BID) | ORAL | Status: AC
  Administered 2011-10-19 (×2): 20 meq via ORAL
  Filled 2011-10-19 (×2): qty 1

## 2011-10-19 MED ORDER — EPHEDRINE SULFATE 50 MG/ML IJ SOLN
INTRAMUSCULAR | Status: DC | PRN
  Administered 2011-10-19 (×2): 5 mg via INTRAVENOUS

## 2011-10-19 MED ORDER — HYOSCYAMINE SULFATE 0.125 MG SL SUBL
0.1250 mg | SUBLINGUAL_TABLET | SUBLINGUAL | Status: DC | PRN
  Filled 2011-10-19: qty 1

## 2011-10-19 MED ORDER — ETOMIDATE 2 MG/ML IV SOLN
INTRAVENOUS | Status: DC | PRN
  Administered 2011-10-19: 14 mg via INTRAVENOUS

## 2011-10-19 MED ORDER — OXYCODONE HCL 5 MG PO TABS
5.0000 mg | ORAL_TABLET | ORAL | Status: DC | PRN
  Administered 2011-10-20: 5 mg via ORAL
  Filled 2011-10-19: qty 1

## 2011-10-19 MED ORDER — HYPROMELLOSE 0.3 % OP SOLN: 1.0000 [drp] | Freq: Two times a day (BID) | OPHTHALMIC | Status: DC | PRN

## 2011-10-19 MED ORDER — HYOSCYAMINE SULFATE 0.125 MG PO TABS: 0.1250 mg | ORAL_TABLET | ORAL | Status: DC | PRN

## 2011-10-19 MED ORDER — FENTANYL CITRATE 0.05 MG/ML IJ SOLN
INTRAMUSCULAR | Status: DC | PRN
  Administered 2011-10-19: 50 ug via INTRAVENOUS

## 2011-10-19 MED ORDER — ACETAMINOPHEN 10 MG/ML IV SOLN
1000.0000 mg | Freq: Four times a day (QID) | INTRAVENOUS | Status: AC
  Administered 2011-10-19 – 2011-10-20 (×4): 1000 mg via INTRAVENOUS
  Filled 2011-10-19 (×4): qty 100

## 2011-10-19 MED ORDER — HYDROMORPHONE HCL PF 1 MG/ML IJ SOLN: 0.2500 mg | INTRAMUSCULAR | Status: DC | PRN

## 2011-10-19 MED ORDER — DIFLUNISAL 500 MG PO TABS
250.0000 mg | ORAL_TABLET | Freq: Two times a day (BID) | ORAL | Status: DC
  Filled 2011-10-19 (×2): qty 1

## 2011-10-19 MED ORDER — ACETAMINOPHEN 10 MG/ML IV SOLN
INTRAVENOUS | Status: AC
  Filled 2011-10-19: qty 100

## 2011-10-19 MED ORDER — IOHEXOL 300 MG/ML  SOLN
INTRAMUSCULAR | Status: AC
  Filled 2011-10-19: qty 1

## 2011-10-19 MED ORDER — POLYVINYL ALCOHOL 1.4 % OP SOLN
1.0000 [drp] | Freq: Two times a day (BID) | OPHTHALMIC | Status: DC | PRN
  Filled 2011-10-19: qty 15

## 2011-10-19 MED ORDER — ONDANSETRON HCL 4 MG/2ML IJ SOLN: 4.0000 mg | INTRAMUSCULAR | Status: DC | PRN

## 2011-10-19 MED ORDER — ACETAMINOPHEN 325 MG PO TABS: 650.0000 mg | ORAL_TABLET | ORAL | Status: DC | PRN

## 2011-10-19 MED ORDER — MUPIROCIN 2 % EX OINT
TOPICAL_OINTMENT | CUTANEOUS | Status: AC
  Filled 2011-10-19: qty 22

## 2011-10-19 MED ORDER — HYDROCODONE-ACETAMINOPHEN 5-325 MG PO TABS: 1.0000 | ORAL_TABLET | ORAL | Status: AC | PRN

## 2011-10-19 MED ORDER — SODIUM CHLORIDE 0.9 % IV SOLN
INTRAVENOUS | Status: DC
  Administered 2011-10-19: 10 mL/h via INTRAVENOUS

## 2011-10-19 SURGICAL SUPPLY — 17 items
ADAPTER CATH URET PLST 4-6FR (CATHETERS) ×2 IMPLANT
BAG URO CATCHER STRL LF (DRAPE) ×2 IMPLANT
BASKET ZERO TIP NITINOL 2.4FR (BASKET) IMPLANT
CATH INTERMIT  6FR 70CM (CATHETERS) IMPLANT
CLOTH BEACON ORANGE TIMEOUT ST (SAFETY) ×2 IMPLANT
DRAPE CAMERA CLOSED 9X96 (DRAPES) ×2 IMPLANT
GLOVE BIOGEL PI IND STRL 7.5 (GLOVE) ×1 IMPLANT
GLOVE BIOGEL PI INDICATOR 7.5 (GLOVE) ×1
GLOVE ECLIPSE 7.5 STRL STRAW (GLOVE) ×2 IMPLANT
GOWN PREVENTION PLUS XLARGE (GOWN DISPOSABLE) ×2 IMPLANT
GOWN STRL NON-REIN LRG LVL3 (GOWN DISPOSABLE) ×2 IMPLANT
GUIDEWIRE ANG ZIPWIRE 038X150 (WIRE) ×2 IMPLANT
GUIDEWIRE STR DUAL SENSOR (WIRE) ×2 IMPLANT
MANIFOLD NEPTUNE II (INSTRUMENTS) ×2 IMPLANT
PACK CYSTO (CUSTOM PROCEDURE TRAY) ×2 IMPLANT
STENT CONTOUR 8FR X 24 (STENTS) ×2 IMPLANT
TUBING CONNECTING 10 (TUBING) ×2 IMPLANT

## 2011-10-19 NOTE — Progress Notes (Signed)
2 EkGS done Dr. Shireen Quan saw both copies.

## 2011-10-19 NOTE — Preoperative (Signed)
Beta Blockers   Reason not to administer Beta Blockers:Not Applicable 

## 2011-10-19 NOTE — Progress Notes (Signed)
12 Lead EKG DONE- Dr. Shireen Quan saw copy.

## 2011-10-19 NOTE — Progress Notes (Signed)
Dr. Margarita Grizzle made aware of patient's condition-  To contact cardiologist and write orders.

## 2011-10-19 NOTE — Anesthesia Preprocedure Evaluation (Signed)
Anesthesia Evaluation  Patient identified by MRN, date of birth, ID band Patient awake  General Assessment Comment:Increased CV risk  Reviewed: Allergy & Precautions, H&P , NPO status , Patient's Chart, lab work & pertinent test results, reviewed documented beta blocker date and time   Airway Mallampati: II TM Distance: >3 FB Neck ROM: Full    Dental  (+) Teeth Intact   Pulmonary neg pulmonary ROS,  clear to auscultation  + decreased breath sounds      Cardiovascular +CHF + dysrhythmias Atrial Fibrillation Regular Normal Pt denies HTN Cardiomyopathy/amyloidosis Poor exercise tolerance D/ced coumadin 2-20   Neuro/Psych Peripheral neuropathy Negative Psych ROS   GI/Hepatic negative GI ROS, Neg liver ROS,   Endo/Other  Negative Endocrine ROS  Renal/GU Kidney stone  Genitourinary negative   Musculoskeletal negative musculoskeletal ROS (+)   Abdominal   Peds negative pediatric ROS (+)  Hematology negative hematology ROS (+)   Anesthesia Other Findings   Reproductive/Obstetrics negative OB ROS                           Anesthesia Physical Anesthesia Plan  ASA: III  Anesthesia Plan: General   Post-op Pain Management:    Induction: Intravenous  Airway Management Planned: LMA  Additional Equipment:   Intra-op Plan:   Post-operative Plan: Extubation in OR  Informed Consent: I have reviewed the patients History and Physical, chart, labs and discussed the procedure including the risks, benefits and alternatives for the proposed anesthesia with the patient or authorized representative who has indicated his/her understanding and acceptance.   Dental advisory given  Plan Discussed with: CRNA and Surgeon  Anesthesia Plan Comments:         Anesthesia Quick Evaluation

## 2011-10-19 NOTE — H&P (Signed)
Urology History and Physical Exam  CC: Right ureter stones  HPI: 71 year old female with history of right urolithiasis.  She had shockwavee lithotripsy 08/10/11 for a right renal pelvic stone.  The stone broke up well and she passed several fragments. She had a CT scan on 09/24/11 which showed 4 stones in her right distal ureter. She was scheduled for ureteroscopy for these fragments due to recent Klebsiella urinary tract infection.  She presented to the office with fever on 10/14/11; culture revealed continued Klebsiella.  Her fever resolved with antibiotics.  She presents today for cystoscopy and right ureter stent placement.  We have discussed the risks, benefits, alternatives, and likelihood of achieving her goals.   PMH: Past Medical History  Diagnosis Date  . Syncope   . Edema   . Dizziness   . Hemorrhoids, internal   . Lactose intolerance   . IBS (irritable bowel syndrome)   . Dyslipidemia   . Hypertension   . Constipation   . Amyloid disease   . Chronic kidney disease     kidney stone  . CHF (congestive heart failure) SECONDARY TO AMYLIODOSIS  . Coronary artery disease CARDIOLOGIST- DR HOCHREIN-- LAST VISIT 08-07-11 IN [facility]  . S/P hemorrhoidectomy   . Right ureteral stone   . Arthritis   . Carotid stenosis, bilateral PER DUPLEX  04-10-2011    RICA  40-59%/  LICA 60-79%  . Cardiomyopathy secondary EF 30%  . Atrial fibrillation   . Hypercholesterolemia   . Spinal stenosis   . Neuromuscular disorder     neuropathy    PSH: Past Surgical History  Procedure Date  . Hemorrhoid surgery 2002  . Carpal tunnel release 2005    RIGHT  . Cysto/ bladder bx and fulgeration 02-21-2007  . Right sural nerve bx/ right gastrocemius muscle bx 09-25-2009  . Breast biopsy 1992  . Appendectomy 1961  . Extracorporeal shock wave lithotripsy 08-10-2011    RIGHT  . Transthoracic echocardiogram 11-24-2010    LVEF 35% WITH DIFFUSE HYPOKINESIS, WORSE IN THE BASE/MID ANTERIOR WALL AND LATERAL  WALL/ BASAL INFERIOR/ INFEROSEPTAL AKINESIS/  CAVITY SIZE NORMAL/ WALL THICKNESS WAS INCREASED IN A PATTERN OF SEVERE LVH/ PAPAMETERS CONSISTENT WITH AN IRREVERSIBLE RESTRICTIVE PATTERN/  GRADE 4 DIASTOLIC DYSFUNCTION/ MILD TO MOD. MV REGUR./ BIL. ATRIUM MOD. DILATED/ RVSF MOD. REDUCED/ SMALL PERICARDIAL EFFUSION  . Cystoscopy 09/2011    Allergies: Allergies  Allergen Reactions  . Statins Other (See Comments)    LEG CRAMPS     Medications: Prescriptions prior to admission  Medication Sig Dispense Refill  . Calcium Citrate-Vitamin D (CITRACAL + D PO) Take 2 tablets by mouth daily.       . ciprofloxacin (CIPRO) 500 MG tablet Take 500 mg by mouth 2 (two) times daily.      . diflunisal (DOLOBID) 500 MG TABS TAKE ONE HALF (1/2) TABLET(S) TWICE DAILY  30 tablet  0  . docusate sodium (COLACE) 100 MG capsule Take 100 mg by mouth 2 (two) times daily.       Marland Kitchen esomeprazole (NEXIUM) 40 MG capsule Take 40 mg by mouth 2 (two) times daily.       . ferrous sulfate 325 (65 FE) MG tablet Take 325 mg by mouth daily with breakfast.       . furosemide (LASIX) 40 MG tablet Take 40-120 mg by mouth daily as needed. SWELLING.  PT CAN TAKE UP TO 120 MG. PT DOES HAVE A RX FOR BOTH 40 MG AND 20 MG.        Marland Kitchen  gabapentin (NEURONTIN) 100 MG capsule Take 100 mg by mouth every 4 (four) hours as needed. PAIN        . Hypromellose (GENTEAL) 0.3 % SOLN Place 1 drop into both eyes 2 (two) times daily as needed. DRY EYES       . loperamide (IMODIUM) 2 MG capsule Take 2 mg by mouth 2 (two) times daily.       . Multiple Vitamins-Minerals (MULTIVITAMINS THER. W/MINERALS) TABS Take 1 tablet by mouth daily.       . potassium chloride (K-DUR) 10 MEQ tablet Take 10-30 mEq by mouth daily as needed. FLUID       . Probiotic Product (ALIGN PO) Take 1 capsule by mouth daily.       . Simethicone 180 MG CAPS Take 1-2 capsules by mouth 2 (two) times daily as needed. GAS       . vitamin B-12 (CYANOCOBALAMIN) 1000 MCG tablet Inject 1,000  mcg as directed every 30 (thirty) days.       . Alpha-D-Galactosidase (BEANO PO) Take 1-2 tablets by mouth 2 (two) times daily as needed. GAS       . diflunisal (DOLOBID) 500 MG TABS Take 0.5 tablets (250 mg total) by mouth 2 (two) times daily.  1 tablet  0  . furosemide (LASIX) 20 MG tablet Take 20 mg by mouth daily as needed. SWELLING.  PT CAN TAKE UP TO 120 MG. PT DOES HAVE A RX FOR BOTH 40 MG AND 20 MG.       . Nutritional Supplements (CARNATION INSTANT BREAKFAST PO) Take 1 Package by mouth 2 (two) times daily.        Marland Kitchen warfarin (COUMADIN) 2.5 MG tablet Take as directed by the Anticoagulation Clinic  45 tablet  3     Social History: History   Social History  . Marital Status: Divorced    Spouse Name: N/A    Number of Children: N/A  . Years of Education: N/A   Occupational History  . Not on file.   Social History Main Topics  . Smoking status: Former Smoker -- 20 years    Types: Cigarettes    Quit date: 10/28/1990  . Smokeless tobacco: Never Used  . Alcohol Use: No  . Drug Use: No  . Sexually Active: Not on file   Other Topics Concern  . Not on file   Social History Narrative  . No narrative on file    Family History: Family History  Problem Relation Age of Onset  . Heart disease Mother     Review of Systems: Positive: Constipation. Negative: Chest pain, fever, emesis, SOB, chills.  A further 10 point review of systems was negative except what is listed in the HPI.  Physical Exam:  General: No acute distress.  Awake. Head:  Normocephalic.  Atraumatic. ENT:  EOMI.  Mucous membranes moist Neck:  Supple.  No lymphadenopathy. CV:  S1 present. S2 present. Regular rate. Pulmonary: Equal effort bilaterally.  Clear to auscultation bilaterally. Abdomen: Soft.  Non- tender to palpation. Skin:  Normal turgor.  No visible rash. Extremity: No gross deformity of bilateral upper extremities.  No gross deformity of    bilateral lower extremities. Neurologic: Alert.  Appropriate mood.  Studies:  No results found for this basename: HGB:2,WBC:2,PLT:2 in the last 72 hours  No results found for this basename: NA:2,K:2,CL:2,CO2:2,BUN:2,CREATININE:2,CALCIUM:2,MAGNESIUM:2,GFRNONAA:2,GFRAA:2 in the last 72 hours   No results found for this basename: PT:2,INR:2,APTT:2 in the last 72 hours   No components found with  this basename: ABG:2    Assessment:  Right ureter stones.  UTI.  Plan: Cystoscopy and right ureter stent placement.

## 2011-10-19 NOTE — Consult Note (Signed)
CARDIOLOGY CONSULT NOTE   Patient ID: Miranda [name] MRN: [id number] DOB/AGE: 1941/07/27 71 y.o.  Admit date: 10/19/2011  Primary Physician   Was Dr Hamilton Capri Primary Cardiologist   [facility] Reason for Consultation   Atrial fib/ RVR  WUJ:[id number] R Miranda [name] is a 71 y.o. female with a history of CAD and PAF. She was admitted today for cystoscopy and ureteral stent. Post-op, she went into atrial fib and cardiology was asked to see her. She had nephrolithiasis, got lithotripsy for this but stones lodged in her ureter and she developed a UTI. She came in for a stent today but is scheduled to have ureteroscopy in early March.  Miranda [name] was in her USOH this am. She has chronic LE edema and some DOE but was at baseline. After the surgery, she felt the palpitations but they were not associated with SOB/CP/presyncope/weakness. Her atrial fib has resolved and she feels OK now. She is concerned that the same thing could happen again with the next procedure scheduled March 6. PTA she was not having palpitations or other symptoms.   Past Medical History  Diagnosis Date  . Syncope   . Edema    LBBB   . Dizziness   . Hemorrhoids, internal   . Lactose intolerance   . IBS (irritable bowel syndrome)   . Dyslipidemia   . Hypertension   . Constipation   . Amyloid disease   . Chronic kidney disease     kidney stone  . 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 Coronary artery disease  DR HOCHREIN-LAST VISIT 10/07/2011   . S/P hemorrhoidectomy   . Right ureteral stone   . Arthritis   . Carotid stenosis, bilateral PER DUPLEX  04-10-2011    RICA  40-59%/  LICA 60-79%  . Cardiomyopathy secondary EF 30%  . Atrial fibrillation   . Hypercholesterolemia   . Spinal stenosis   . Neuromuscular disorder     neuropathy     Past Surgical History  Procedure Date  . Hemorrhoid surgery 2002   Cardiac Cath in [place] - There was an OM which had long 40%  stenosis.  The ramus intermedius was small with ostial 95% stenosis.  The right coronary artery was dominant, with 40% proximal stenosis and 25% mid stenosis.  All her vessels were narrow caliber.  2008  . Carpal tunnel release 2005    RIGHT  . Cysto/ bladder bx and fulgeration 02-21-2007  . Right sural nerve bx/ right gastrocemius muscle bx 09-25-2009  . Breast biopsy 1992  . Appendectomy 1961  . Extracorporeal shock wave lithotripsy 08-10-2011    RIGHT  . Transthoracic echocardiogram 10/15/2011    The estimated ejection fraction was in the range of 20% to 25% with diastolic dysfunction  . Cystoscopy 09/2011    Allergies  Allergen Reactions  . Statins Other (See Comments)    LEG CRAMPS     I have reviewed the patient's current medications.   Medication Sig  . Calcium Citrate-Vitamin D (CITRACAL + D PO) Take 2 tablets by mouth daily.   . diflunisal (DOLOBID) 500 MG TABS TAKE ONE HALF (1/2) TABLET(S) TWICE DAILY  . docusate sodium (COLACE) 100 MG capsule Take 100 mg by mouth 2 (two) times daily.   Marland Kitchen esomeprazole (NEXIUM) 40 MG capsule Take 40 mg by mouth 2 (two) times daily.   . ferrous sulfate 325 (65 FE) MG tablet Take 325 mg by mouth daily with breakfast.   .  furosemide (LASIX) 40 MG tablet Take 40-60 mg by mouth daily as needed. SWELLING.  PT CAN TAKE UP TO 120 MG. PT DOES HAVE A RX FOR BOTH 40 MG AND 20 MG. Takes the extra 20 MG about 2 x week  . gabapentin (NEURONTIN) 100 MG capsule Take 100 mg by mouth every 4 (four) hours as needed. PAIN  . Hypromellose (GENTEAL) 0.3 % SOLN Place 1 drop into both eyes 2 (two) times daily as needed.   . loperamide (IMODIUM) 2 MG capsule Take 2 mg by mouth 2 (two) times daily.   . Multiple Vitamins-Minerals (MULTIVITAMINS THER. W/MINERALS) TABS Take 1 tablet by mouth daily.   . potassium chloride (K-DUR) 10 MEQ tablet Take 10-30 mEq by mouth daily as needed. FLUID  . Probiotic Product (ALIGN PO) Take 1 capsule by mouth daily.   . Simethicone  180 MG CAPS Take 1-2 capsules by mouth 2 (two) times daily as needed. GAS  . vitamin B-12 (CYANOCOBALAMIN) 1000 MCG tablet Inject 1,000 mcg as directed every 30 (thirty) days.   . Alpha-D-Galactosidase (BEANO PO) Take 1-2 tablets by mouth 2 (two) times daily as needed. GAS   . diflunisal (DOLOBID) 500 MG TABS Take 0.5 tablets (250 mg total) by mouth 2 (two) times daily.  . Nutritional Supplements (CARNATION INSTANT BREAKFAST PO) Take 1 Package by mouth 2 (two) times daily.    Marland Kitchen warfarin (COUMADIN) 2.5 MG tablet Take as directed by the Anticoagulation Clinic    History   Social History  . Marital Status: Divorced    Spouse Name: N/A    Number of Children: N/A  . Years of Education: N/A   Social History Main Topics  . Smoking status: Former Smoker -- 20 years    Types: Cigarettes    Quit date: 10/28/1990  . Smokeless tobacco: Never Used  . Alcohol Use: No  . Drug Use: No  . Sexually Active: Not on file   Social History Narrative  .  The patient is retired.  She is divorced.  She has 2  children.  She smoked 3 packs of cigarettes a day for 20 years but quit > 20 years ago.  She occasionally drinks wine.     Family History - Noncontributory for early coronary disease.  Both her  parents died at later ages of heart failure.  Problem Relation Age of Onset  . Heart disease Mother      ROS: Chronic Miranda aches/pains. Chronic DOE and LE edema but well managed with salt restriction, elevating her legs and daily + PRN Lasix. Her UTI symptoms have improved on ABX. She has chronic diarrhea - takes Imodium BID but is currently constipated. No reflux or melena. No fevers or chills. Full 14 point review of systems complete and found to be negative unless listed above.  Physical Exam: Blood pressure 150/78, pulse 123, temperature 97 F (36.1 C), resp. rate 12, height 5\' 4"  (1.626 m), weight 125 lb (56.7 kg), last menstrual period 08/04/1991, SpO2 100.00%.   General: Well developed, well  nourished, elderly female in no acute distress Head: Eyes PERRLA, No xanthomas.   Normocephalic and atraumatic, oropharynx without edema or exudate. Dentition OK Lungs:  Bibasilar crackles with rales Heart: HRRR S1 S2, no rub/gallop, no murmur. pulses are 2+ & equal all 4 extrem.   Neck: No carotid bruit. No lymphadenopathy.  JVD elevated at 10 cm. Abdomen: Bowel sounds decreased but present, abdomen soft and non-tender without masses or hernias noted. Msk:  No spine  or cva tenderness. Normal strength and tone for age, no joint deformities or effusions. Extremities: No clubbing or cyanosis.  1+ edema.  Neuro: Alert and oriented X 3. No focal deficits noted. Psych:  Good affect, responds appropriately Skin: No rashes or lesions noted.  Labs:   Lab Results  Component Value Date   WBC 5.2 10/19/2011   HGB 12.5 10/19/2011   HCT 38.2 10/19/2011   MCV 95.3 10/19/2011   PLT 233 10/19/2011    Basename 10/19/11 0635  INR 1.18    Lab 10/19/11 0635  NA 139  K 3.8  CL 99  CO2 33*  BUN 19  CREATININE 0.86  CALCIUM 9.4  PROT --  BILITOT --  ALKPHOS --  ALT --  AST --  GLUCOSE 93   Echo: 10/15/2011 Study Conclusions - Left ventricle: Wall thickness was increased in a pattern of severe LVH. Systolic function was severely reduced. The estimated ejection fraction was in the range of 20% to 25%. Features a pseudonormal left ventricular filling pattern, with concomitant abnormal relaxation and increased filling pressure (grade 2 diastolic dysfunction). - Mitral valve: Mild regurgitation. - Left atrium: The atrium was moderately dilated. - Right ventricle: The cavity size was mildly dilated. Systolic function was mildly to moderately reduced. - Right atrium: The atrium was mildly dilated. - Pulmonary arteries: PA peak pressure: 35mm Hg (S). - Pericardium, extracardiac: A trivial pericardial effusion was identified.  Radiology:   Dg Chest 2 View 10/19/2011  *RADIOLOGY REPORT*  Clinical Data:  Preop chest  CHEST - 2 VIEW  Comparison: 09/23/2009  Findings: Chronic interstitial markings.  Mild with patchy opacity in the lingula, possibly scarring. No pleural effusion or pneumothorax.  Stable mild cardiomegaly.  Degenerative changes of the visualized thoracolumbar spine.  IMPRESSION: Chronic interstitial markings.  Mild patchy opacity in the lingula, possibly scarring.  Stable mild cardiomegaly.  Original Report Authenticated By: Charline Bills, M.D.   Dg Abd 1 View - Morning Of Procedure 10/19/2011  *RADIOLOGY REPORT*  Clinical Data: Preop lithotripsy, distended abdomen, constipation  ABDOMEN - 1 VIEW  Comparison: 08/10/2011  Findings: Prior right renal calculus is not well demonstrated on the current study.  Moderate stool in the right colon.  Degenerative changes of the lumbar spine and bilateral hips.  IMPRESSION: Prior right renal calculus is not well demonstrated on the current study.  Moderate stool in the right colon.  Original Report Authenticated By: Charline Bills, M.D.   EKG:  2/25 ECGs 9:06 - ST rate 117 with LBBB (old) 9:09 - +artifact, HR 87 with ?flutter waves 9:14 - atrial flutter vs coarse fib, rate about 145 (reported as 218) 9:15 - 12-lead rhythm strip is atrial flutter vs coarse fib On telemetry now, SR with HR 80s.  ASSESSMENT AND PLAN:   The patient was seen today by Dr Shirlee Latch, the patient evaluated and the data reviewed.  1. S/p cystocopy with stent placement - mgt per Dr Lucretia Roers.  2. Atrial fib - paroxysmal, not new, resolved spontaneously. Pt with no significant symptoms. Pt not on BB PTA, her BP has limited med titration per previous notes. BP OK now, MD advise on low-dose BB or just continue to follow on telemetry.  3. Chronic mixed systolic/diastolic CHF - per pt, wt does not vary much and she manages extra volume with extra Lasix. She takes 40 mg daily, ordered daily PRN so will change. Continue daily wts/ I/O.  4. Anticoag - restart coumadin per Dr Lucretia Roers.    5. Otherwise, per  Dr Lucretia Roers.  Signed: Theodore Demark 10/19/2011, 9:36 AM Co-Sign MD  Patient seen with PA, agree with the above note.  1. Atrial fibrillation: Paroxysmal.  Now back in NSR.  Plenty of BP room, will add Toprol XL 25 mg bid.  Needs to restart coumadin when ok with surgery.   2. 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  Starting Toprol XL, consider ACEI as outpatient.  EF 20-25% on recent echo.   Marca Ancona 10/19/2011 12:06 PM

## 2011-10-19 NOTE — Progress Notes (Signed)
Pt has only voided 100cc since surgery.  Bladder scan revealed less than 200cc left in bladder. Dr. Margarita Grizzle notified, will continue to monitor urine output.

## 2011-10-19 NOTE — Transfer of Care (Signed)
Immediate Anesthesia Transfer of Care Note  Patient: Miranda [name]  Procedure(s) Performed: Procedure(s) (LRB): CYSTOSCOPY WITH STENT PLACEMENT (Right)  Patient Location: PACU  Anesthesia Type: General  Level of Consciousness: awake, alert , oriented and patient cooperative  Airway & Oxygen Therapy: Patient Spontanous Breathing and Patient connected to face mask oxygen  Post-op Assessment: Report given to PACU RN, Post -op Vital signs reviewed and stable and Patient moving all extremities  Post vital signs: Reviewed and stable  Complications: No apparent anesthesia complications

## 2011-10-19 NOTE — Progress Notes (Signed)
12 Lead EKG DONE- DR. Shireen Quan saw copy.

## 2011-10-19 NOTE — Progress Notes (Signed)
Basic metabolic panel drawn by lab.

## 2011-10-19 NOTE — Progress Notes (Signed)
Miranda [name], cardiology PA in to see patient

## 2011-10-19 NOTE — Progress Notes (Signed)
Running rhythmn strip donde- Dr. Shireen Quan saw copy.

## 2011-10-19 NOTE — Discharge Instructions (Signed)
DISCHARGE INSTRUCTIONS FOR KIDNEY STONES OR URETERAL STENT  MEDICATIONS:   Resume all your other meds from home - except do not take any other pain meds that you may have at home.  ACTIVITY 1. No strenuous activity x 1week 2. No driving while on narcotic pain medications 3. Drink plenty of water 4. Continue to walk at home - you can still get blood clots when you are at home, so keep active, but don't over do it. 5. May return to work in 3 days.  BATHING 1. You can shower and we recommend daily showers  2. If you have a string coming from your urethra:  The stent string is attached to your ureteral stent.  Do not pull on this.   SIGNS/SYMPTOMS TO CALL: 1. Please call us if you have a fever greater than 101.5, uncontrolled  nausea/vomiting, uncontrolled pain, dizziness, unable to urinate, chest pain, shortness of breath, leg swelling, leg pain, redness around wound, drainage from wound, or any other concerns or questions.  You can reach [place] at [phone].

## 2011-10-19 NOTE — Progress Notes (Signed)
Dr. Shireen Quan in- made aware of patient's heart rates and rhythmns- blood pressures as well- ekg readings from sinus rhythmn  With pvcs to atrial fib and then back to sinus rhythmn with pvcs

## 2011-10-19 NOTE — Anesthesia Postprocedure Evaluation (Signed)
  Anesthesia Post-op Note  Patient: Miranda [name]  Procedure(s) Performed: Procedure(s) (LRB): CYSTOSCOPY WITH STENT PLACEMENT (Right)  Patient Location: PACU  Anesthesia Type: General  Level of Consciousness: oriented and sedated  Airway and Oxygen Therapy: Patient Spontanous Breathing and Patient connected to nasal cannula oxygen  Post-op Pain: none  Post-op Assessment: Post-op Vital signs reviewed, Patient's Cardiovascular Status Stable, Respiratory Function Stable and Patent Airway  Post-op Vital Signs: stable  Complications: No apparent anesthesia complications

## 2011-10-19 NOTE — Brief Op Note (Signed)
10/19/2011  8:15 AM  PATIENT:  [name]  71 y.o. female  PRE-OPERATIVE DIAGNOSIS:  Right Distal Ureteral Stone, Urinary Tract Infection  POST-OPERATIVE DIAGNOSIS:  Right Distal Ureteral Stone, Urinary Tract Infection  PROCEDURE:  Procedure(s) (LRB): CYSTOSCOPY WITH STENT PLACEMENT (Right)  SURGEON:  Surgeon(s) and Role:    * Milford Cage, MD - Primary  PHYSICIAN ASSISTANT:   ASSISTANTS: none   ANESTHESIA:   general  EBL:   None  BLOOD ADMINISTERED:none  DRAINS: none   LOCAL MEDICATIONS USED:  NONE  SPECIMEN:  No Specimen  DISPOSITION OF SPECIMEN:  N/A  COUNTS:  YES  TOURNIQUET:  * No tourniquets in log *  DICTATION: .Other Dictation: Dictation Number [id number]  PLAN OF CARE: Discharge to home after PACU  PATIENT DISPOSITION:  PACU - hemodynamically stable.   Delay start of Pharmacological VTE agent (>24hrs) due to surgical blood loss or risk of bleeding: no

## 2011-10-20 LAB — CBC
HCT: 37.3 % (ref 36.0–46.0)
Hemoglobin: 12.3 g/dL (ref 12.0–15.0)
MCH: 31.2 pg (ref 26.0–34.0)
MCHC: 33 g/dL (ref 30.0–36.0)
MCV: 94.7 fL (ref 78.0–100.0)

## 2011-10-20 LAB — BASIC METABOLIC PANEL
BUN: 20 mg/dL (ref 6–23)
Calcium: 8.8 mg/dL (ref 8.4–10.5)
Creatinine, Ser: 0.85 mg/dL (ref 0.50–1.10)
GFR calc non Af Amer: 67 mL/min — ABNORMAL LOW (ref >90)
Glucose, Bld: 128 mg/dL — ABNORMAL HIGH (ref 70–99)
Potassium: 4 mEq/L (ref 3.5–5.1)

## 2011-10-20 MED ORDER — POTASSIUM CHLORIDE CRYS ER 20 MEQ PO TBCR
20.0000 meq | EXTENDED_RELEASE_TABLET | Freq: Every day | ORAL | Status: DC
  Administered 2011-10-20: 20 meq via ORAL
  Filled 2011-10-20 (×2): qty 1

## 2011-10-20 MED ORDER — METOPROLOL SUCCINATE ER 25 MG PO TB24: 25.0000 mg | ORAL_TABLET | Freq: Two times a day (BID) | ORAL | Status: DC

## 2011-10-20 NOTE — Progress Notes (Signed)
Pt. Was very concerned about not receiving cipro, she stated that she was taking that med at home and needed to take it now.  I explained the medication to her that she will be receiving now and for the upcoming shift.  Dr. Margarita Grizzle made aware of her concern this morning.

## 2011-10-20 NOTE — Progress Notes (Signed)
Urology Progress Note  Subjective:     No acute urologic events overnight. She had an 8-beat run of V-tach overnight; was asleep at the time and maintained her BP. Negative chest pain, palpitations.  Positive gross hematuria which is expected following her surgery.  Pain controlled. No nausea or emesis.  Objective:  Patient Vitals for the past 24 hrs:  BP Temp Temp src Pulse Resp SpO2 Height Weight  10/20/11 0508 107/52 mmHg 98.5 F (36.9 C) Oral 68  18  95 % - -  10/19/11 2219 107/58 mmHg 98.6 F (37 C) Oral 70  17  95 % - -  10/19/11 1526 101/55 mmHg 98.4 F (36.9 C) Oral - 16  94 % - -  10/19/11 1129 137/64 mmHg 98.1 F (36.7 C) Oral 86  14  97 % 5\' 5"  (1.651 m) 56.7 kg (125 lb)  10/19/11 1100 131/59 mmHg 98.1 F (36.7 C) - 83  14  99 % - -  10/19/11 1045 140/68 mmHg - - 84  16  99 % - -  10/19/11 1030 139/68 mmHg - - 84  13  98 % - -  10/19/11 1015 140/76 mmHg 97 F (36.1 C) - 83  13  99 % - -  10/19/11 1000 141/68 mmHg - - 84  11  99 % - -  10/19/11 0945 145/66 mmHg - - 84  17  97 % - -  10/19/11 0930 150/78 mmHg - - 123  12  100 % - -  10/19/11 0929 - - - 90  19  100 % - -  10/19/11 0915 133/84 mmHg 97 F (36.1 C) - 86  18  100 % - -  10/19/11 0900 113/87 mmHg - - 92  14  100 % - -  10/19/11 0845 143/79 mmHg - - 87  13  100 % - -  10/19/11 0830 135/63 mmHg - - 87  13  100 % - -  10/19/11 0820 133/61 mmHg 97.8 F (36.6 C) - 92  6  100 % - -    Physical Exam: General:  No acute distress, awake Cardiovascular:    [x]   S1/S2 present, RRR  []   Irregularly irregular Chest:  CTA-B Abdomen:               []  Soft, appropriately TTP  [x]  Soft, NTTP  []  Soft, appropriately TTP, incision(s) clean/dry/intact  Genitourinary: No Foley    I/O last 3 completed shifts: In: 1078.2 [P.O.:120; I.V.:758.2; IV Piggyback:200] Out: 100 [Urine:100]  Recent Labs  [facility] 10/20/11 0435 10/19/11 0635   HGB 12.3 12.5   WBC 8.7 5.2   PLT 241 233    Recent Labs  [facility]  10/20/11 0435 10/19/11 0927   NA 133* 139   K 4.0 3.2*   CL 97 99   CO2 30 30   BUN 20 19   CREATININE 0.85 0.80   CALCIUM 8.8 9.3   GFRNONAA 67* 72*   GFRAA 77* 83*     Recent Labs  Basename 10/19/11 0635   INR 1.18   APTT 36     No components found with this basename: ABG:2     Assessment/Plan: Right partially obstructing ureter stones. POD#1 right ureter stent placement.   1. A-fib with rapid heart rate: Back in rhythm; beta blocker added by Cardiology. 2. Short run of possible V-tach: Await Cardiology rec's. 3. Right ureter stones: Stented. Will saline lock IV. Continue Rocephin.  Disposition: Patient stable for  discharge from urology standpoint.  Have the following questions for cardiology: -When is it safe to discharge her home?  -Patient scheduled for ureteroscopy for stone removal next week, 10/28/11. Is it ok to proceed or should this be delayed from the cardiac point of view?  -If patient will have surgery next week, her coumadin will need to be held 5 days before (therefore restarting it would not help at this time). If surgery next week is permissible, should she be on home lovenox injection?  -    Natalia Leatherwood, MD [phone]

## 2011-10-20 NOTE — Progress Notes (Signed)
@   Subjective:  Denies CP or dyspnea   Objective:  Filed Vitals:   10/19/11 1129 10/19/11 1526 10/19/11 2219 10/20/11 0508  BP: 137/64 101/55 107/58 107/52  Pulse: 86  70 68  Temp: 98.1 F (36.7 C) 98.4 F (36.9 C) 98.6 F (37 C) 98.5 F (36.9 C)  TempSrc: Oral Oral Oral Oral  Resp: 14 16 17 18   Height: 5\' 5"  (1.651 m)     Weight: 125 lb (56.7 kg)     SpO2: 97% 94% 95% 95%    Intake/Output from previous day:  Intake/Output Summary (Last 24 hours) at 10/20/11 0910 Last data filed at 10/20/11 0500  Gross per 24 hour  Intake    787 ml  Output    550 ml  Net    237 ml    Physical Exam: Physical exam: Well-developed well-nourished in no acute distress.  Skin is warm and dry.  HEENT is normal.  Neck is supple. No thyromegaly.  Chest is clear to auscultation with normal expansion.  Cardiovascular exam is regular rate and rhythm.  Abdominal exam nontender or distended. No masses palpated. Extremities show trace edema. neuro grossly intact    Lab Results: Basic Metabolic Panel:  Basename 10/20/11 0435 10/19/11 0927  NA 133* 139  K 4.0 3.2*  CL 97 99  CO2 30 30  GLUCOSE 128* 110*  BUN 20 19  CREATININE 0.85 0.80  CALCIUM 8.8 9.3  MG -- --  PHOS -- --   CBC:  Basename 10/20/11 0435 10/19/11 0635  WBC 8.7 5.2  NEUTROABS -- --  HGB 12.3 12.5  HCT 37.3 38.2  MCV 94.7 95.3  PLT 241 233     Assessment/Plan:  1) atrial fibrillation - patient back in NSR. Continue toprol. Coumadin on hold as she will require procedure next week and there is some hematuria. Would not add lovenox (no history of embolic event). Resume coumadin after next procedure when hemostasis achieved. 2) NSVT - noted on telemetry; K normal; check Mg; supplement if needed; continue beta blocker. 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 This can be reviewed with Dr Antoine Poche in [place]. 3) cardiomyopathy - Continue beta blocker; consider ACEI as outpatient if BP  allows.Continue present dose of diuretic. Patient can be dced from a cardiac standpoint. FU with Dr Antoine Poche 2-4 weeks   Olga Millers 10/20/2011, 9:10 AM

## 2011-10-20 NOTE — Progress Notes (Signed)
Patient discharge to home, alert and oriented, no complaints of any pain or discomfort.PIV removed no s/s of  Swelling or infiltration noted. Discharge instructions , follow up appointment discussed with patient, verbalized understanding.

## 2011-10-20 NOTE — Progress Notes (Signed)
Pt. Had an 8 beat run of V-tach, pt. Is asymptomatic and asleep, BP 107/58 HR 70, [place] on-call notified, no new orders received, will continue to monitor pt. S.Nieves Barberi RN

## 2011-10-20 NOTE — Discharge Summary (Signed)
Physician Discharge Summary  Patient ID: Miranda [name] MRN: [id number] DOB/AGE: 72-Sep-1942 72 y.o.  Admit date: 10/19/2011 Discharge date: 10/20/2011  Admission Diagnoses: Atrial fibrillation  Discharge Diagnoses:  Atrial fibrillation.  Discharged Condition: good  Hospital Course:  Patient was admitted after her right ureter stent placement due to A-fib with a rapid heart rate.  Cardiology was consulted.  She went back into normal sinus rhythm.  She was started on metoprolol 25 mg BID.  She was monitored on telemetry.  She had an asymptomatic 8 beat run of ventricular tachycardia overnight, but no other issues.  It was felt she could be discharge home on POD#1.  Consults: cardiology  Significant Diagnostic Studies: Magnesium and Potassium normal.  Treatments: surgery: Right ureter stent placement.  Discharge Exam: Blood pressure 107/52, pulse 68, temperature 98.5 F (36.9 C), temperature source Oral, resp. rate 18, height 5\' 5"  (1.651 m), weight 56.7 kg (125 lb), last menstrual period 08/04/1991, SpO2 95.00%. See PE from progress note from date of discharge.  Disposition: 01-Home or Self Care  Discharge Orders    Future Appointments: Provider: Department: Dept Phone: Center:   11/05/2011 1:30 PM [facility] [phone] None   11/27/2011 3:30 PM Denton Meek, MD [facility] [phone] None   12/17/2011 2:00 PM Rollene Rotunda, MD [facility] [phone] [facility]   01/07/2012 9:30 AM Rene Paci, MD [facility] [phone] [facility]     Future Orders Please Complete By Expires   Discharge patient      Discharge patient        Medication List  As of 10/20/2011  2:27 PM   TAKE these medications         ALIGN PO   Take 1 capsule by mouth daily.      BEANO PO   Take 1-2 tablets by mouth 2 (two) times daily as needed. GAS        CARNATION INSTANT BREAKFAST PO   Take 1 Package by mouth 2 (two) times daily.      ciprofloxacin  500 MG tablet   Commonly known as: CIPRO   Take 500 mg by mouth 2 (two) times daily.      CITRACAL + D PO   Take 2 tablets by mouth daily.      cyanocobalamin 1000 MCG/ML injection   Commonly known as: (VITAMIN B-12)   Inject 1,000 mcg into the muscle every 30 (thirty) days.      diflunisal 500 MG Tabs   Commonly known as: DOLOBID   TAKE ONE HALF (1/2) TABLET(S) TWICE DAILY      docusate sodium 100 MG capsule   Commonly known as: COLACE   Take 100 mg by mouth 2 (two) times daily.      esomeprazole 40 MG capsule   Commonly known as: NEXIUM   Take 40 mg by mouth 2 (two) times daily.      ferrous sulfate 325 (65 FE) MG tablet   Take 325 mg by mouth daily with breakfast.      furosemide 20 MG tablet   Commonly known as: LASIX   Take 20 mg by mouth daily as needed. SWELLING.  PT CAN TAKE UP TO 120 MG. PT DOES HAVE A RX FOR BOTH 40 MG AND 20 MG.        furosemide 40 MG tablet   Commonly known as: LASIX   Take 40-120 mg by mouth daily as needed. SWELLING.  PT CAN TAKE UP TO 120 MG. PT DOES HAVE  A RX FOR BOTH 40 MG AND 20 MG.        gabapentin 100 MG capsule   Commonly known as: NEURONTIN   Take 100 mg by mouth every 4 (four) hours as needed. PAIN          GENTEAL 0.3 % Soln   Generic drug: Hypromellose   Place 1 drop into both eyes 2 (two) times daily as needed. DRY EYES        HYDROcodone-acetaminophen 5-325 MG per tablet   Commonly known as: NORCO   Take 1-2 tablets by mouth every 4 (four) hours as needed for pain.      hyoscyamine 0.125 MG tablet   Commonly known as: LEVSIN, ANASPAZ   Take 1 tablet (0.125 mg total) by mouth every 4 (four) hours as needed for cramping (bladder spasms).      loperamide 2 MG capsule   Commonly known as: IMODIUM   Take 2 mg by mouth 2 (two) times daily.      metoprolol succinate 25 MG 24 hr tablet   Commonly known as: TOPROL-XL   Take 1 tablet (25 mg total) by mouth 2 (two) times daily.      multivitamins ther. w/minerals  Tabs   Take 1 tablet by mouth daily.      omeprazole 20 MG capsule   Commonly known as: PRILOSEC   Take 20 mg by mouth daily.      phenazopyridine 100 MG tablet   Commonly known as: PYRIDIUM   Take 1 tablet (100 mg total) by mouth every 8 (eight) hours as needed for pain (Burning urination.  Will turn urine and body fluids orange.).      potassium chloride 10 MEQ tablet   Commonly known as: K-DUR   Take 10-30 mEq by mouth daily as needed. FLUID        Simethicone 180 MG Caps   Take 1-2 capsules by mouth 2 (two) times daily as needed. GAS        warfarin 2.5 MG tablet   Commonly known as: COUMADIN   Take as directed by the Anticoagulation Clinic           Follow-up Information    Follow up on 10/28/2011. (Follow up for your scheduled surgery to remove the stones.)          Signed: Milford Cage 10/20/2011, 2:27 PM

## 2011-10-21 ENCOUNTER — Inpatient Hospital Stay (HOSPITAL_COMMUNITY): Admission: RE | Admit: 2011-10-21 | Payer: Medicare Other | Source: Ambulatory Visit

## 2011-10-21 ENCOUNTER — Encounter (HOSPITAL_COMMUNITY): Payer: Self-pay | Admitting: Pharmacy Technician

## 2011-10-21 NOTE — Op Note (Signed)
NAMEMarland Kitchen  Miranda, [name]  [id number]  MEDICAL RECORD NO.:  [id number]  LOCATION:  1442                         FACILITY:  [facility]  PHYSICIAN:  Natalia Leatherwood, MD    DATE OF BIRTH:  04/15/1941  DATE OF PROCEDURE:  10/19/2011 DATE OF DISCHARGE:  10/20/2011                              OPERATIVE REPORT   PREOPERATIVE DIAGNOSIS:  Right ureteral stone and urinary tract infection.  POSTOPERATIVE DIAGNOSIS:  Right ureteral stone and urinary tract infection.  PROCEDURES PERFORMED:  Cystoscopy and right ureteral stent placement and fluoroscopy.  ESTIMATED BLOOD LOSS:  None.  COMPLICATIONS:  None.  DRAINS:  None.  SPECIMEN:  None.  FINDINGS:  Right partially obstructed ureter with stone.  HISTORY OF PRESENT ILLNESS:  This 71 year old female, underwent shockwave lithotripsy for a right ureteropelvic junction stone.  She developed obstruction of the stone fragments that were in the process of passing.  She presented last week with fever and a positive urinary tract infection.  This responded to antibiotics.  She presents today for ureteral stent placement due to partially obstructing stones and urinary tract infection.  PROCEDURE IN DETAIL:  Informed consent was obtained.  The patient was taken to the operating room, where she was placed in supine position. IV antibiotics were infused and general anesthesia was induced.  She was then placed in dorsal lithotomy position.  We made sure to pad all pertinent neurovascular pressure points.  Following this, the genitals were prepped and draped in usual sterile fashion.  A time-out was performed in which correct patient, surgical site, and procedure were all identified and agreed upon by the team.  Following this, a 30-degree cystoscope was advanced through the urethra into the bladder.  The bladder was drained and then attention was turned to right ureteral orifice.  This was tend to be cannulated with a Sensor tip  wire; however, the Sensor tip wire could not be easily placed, past the distal ureteral stone.  Therefore an angle-tip Glidewire was obtained and this was able to be placed easily, past the stone up into the renal pelvis on fluoroscopy.  Next, a 6 x 24 double-J ureteral stent was placed up over the wire with ease, with good curl noted in the right renal pelvis on fluoroscopy.  There is a good curl noted in the bladder on direct visualization.  There were no strings left in place on the stent.  The bladder was then emptied, and this completed the procedure.  She has placed back in supine position. Anesthesia was reversed.  She was taken to the PACU in stable condition. She will follow up next week for interval ureteroscopy.          ______________________________ Natalia Leatherwood, MD     DW/MEDQ  D:  10/19/2011  T:  10/21/2011  Job:  [id number]

## 2011-10-22 ENCOUNTER — Other Ambulatory Visit: Payer: Self-pay | Admitting: Urology

## 2011-10-23 ENCOUNTER — Encounter (HOSPITAL_COMMUNITY): Payer: Self-pay | Admitting: *Deleted

## 2011-10-28 ENCOUNTER — Ambulatory Visit (HOSPITAL_COMMUNITY): Payer: Medicare Other | Admitting: Anesthesiology

## 2011-10-28 ENCOUNTER — Encounter (HOSPITAL_COMMUNITY)
Admission: RE | Disposition: A | Discharge status: Home / Self Care | Payer: Self-pay | Source: Ambulatory Visit | Attending: Urology

## 2011-10-28 ENCOUNTER — Other Ambulatory Visit: Payer: Self-pay

## 2011-10-28 ENCOUNTER — Encounter (HOSPITAL_COMMUNITY): Payer: Self-pay | Admitting: Anesthesiology

## 2011-10-28 ENCOUNTER — Encounter (HOSPITAL_COMMUNITY): Payer: Self-pay | Admitting: *Deleted

## 2011-10-28 ENCOUNTER — Ambulatory Visit (HOSPITAL_COMMUNITY)
Admission: RE | Admit: 2011-10-28 | Discharge: 2011-10-28 | Disposition: A | Discharge status: Home / Self Care | Payer: Medicare Other | Source: Ambulatory Visit | Attending: Urology | Admitting: Urology

## 2011-10-28 ENCOUNTER — Other Ambulatory Visit (HOSPITAL_COMMUNITY): Payer: Self-pay | Admitting: *Deleted

## 2011-10-28 DIAGNOSIS — I509 Heart failure, unspecified: Secondary | ICD-10-CM | POA: Insufficient documentation

## 2011-10-28 DIAGNOSIS — N201 Calculus of ureter: Secondary | ICD-10-CM | POA: Insufficient documentation

## 2011-10-28 DIAGNOSIS — Z79899 Other long term (current) drug therapy: Secondary | ICD-10-CM | POA: Insufficient documentation

## 2011-10-28 DIAGNOSIS — I6529 Occlusion and stenosis of unspecified carotid artery: Secondary | ICD-10-CM | POA: Insufficient documentation

## 2011-10-28 DIAGNOSIS — I658 Occlusion and stenosis of other precerebral arteries: Secondary | ICD-10-CM | POA: Insufficient documentation

## 2011-10-28 DIAGNOSIS — I429 Cardiomyopathy, unspecified: Secondary | ICD-10-CM | POA: Insufficient documentation

## 2011-10-28 DIAGNOSIS — I10 Essential (primary) hypertension: Secondary | ICD-10-CM | POA: Insufficient documentation

## 2011-10-28 DIAGNOSIS — E785 Hyperlipidemia, unspecified: Secondary | ICD-10-CM | POA: Insufficient documentation

## 2011-10-28 DIAGNOSIS — E7800 Pure hypercholesterolemia, unspecified: Secondary | ICD-10-CM | POA: Insufficient documentation

## 2011-10-28 DIAGNOSIS — I2510 Atherosclerotic heart disease of native coronary artery without angina pectoris: Secondary | ICD-10-CM | POA: Insufficient documentation

## 2011-10-28 DIAGNOSIS — I4891 Unspecified atrial fibrillation: Secondary | ICD-10-CM | POA: Insufficient documentation

## 2011-10-28 HISTORY — PX: CYSTOSCOPY WITH URETEROSCOPY: SHX5123

## 2011-10-28 SURGERY — CYSTOSCOPY WITH URETEROSCOPY
Anesthesia: General | Site: Perineum | Laterality: Right | Wound class: Clean Contaminated

## 2011-10-28 MED ORDER — ONDANSETRON HCL 4 MG/2ML IJ SOLN
INTRAMUSCULAR | Status: DC | PRN
  Administered 2011-10-28: 4 mg via INTRAVENOUS

## 2011-10-28 MED ORDER — DEXTROSE 5 % IV SOLN
1.0000 g | INTRAVENOUS | Status: AC
  Administered 2011-10-28: 1 g via INTRAVENOUS
  Filled 2011-10-28: qty 10

## 2011-10-28 MED ORDER — PROPOFOL 10 MG/ML IV EMUL
INTRAVENOUS | Status: DC | PRN
  Administered 2011-10-28: 100 mg via INTRAVENOUS

## 2011-10-28 MED ORDER — LACTATED RINGERS IV SOLN: INTRAVENOUS | Status: DC

## 2011-10-28 MED ORDER — BELLADONNA ALKALOIDS-OPIUM 16.2-60 MG RE SUPP
RECTAL | Status: AC
  Filled 2011-10-28: qty 1

## 2011-10-28 MED ORDER — IOHEXOL 300 MG/ML  SOLN
INTRAMUSCULAR | Status: DC | PRN
  Administered 2011-10-28: 10 mL

## 2011-10-28 MED ORDER — IOHEXOL 300 MG/ML  SOLN
INTRAMUSCULAR | Status: AC
  Filled 2011-10-28: qty 1

## 2011-10-28 MED ORDER — ACETAMINOPHEN 10 MG/ML IV SOLN
INTRAVENOUS | Status: AC
  Filled 2011-10-28: qty 100

## 2011-10-28 MED ORDER — EPHEDRINE SULFATE 50 MG/ML IJ SOLN
INTRAMUSCULAR | Status: DC | PRN
  Administered 2011-10-28: 5 mg via INTRAVENOUS

## 2011-10-28 MED ORDER — ACETAMINOPHEN 10 MG/ML IV SOLN
INTRAVENOUS | Status: DC | PRN
  Administered 2011-10-28: 1000 mg via INTRAVENOUS

## 2011-10-28 MED ORDER — FENTANYL CITRATE 0.05 MG/ML IJ SOLN: 25.0000 ug | INTRAMUSCULAR | Status: DC | PRN

## 2011-10-28 MED ORDER — LIDOCAINE HCL 1 % IJ SOLN
INTRAMUSCULAR | Status: DC | PRN
  Administered 2011-10-28: 40 mg via INTRADERMAL

## 2011-10-28 MED ORDER — KETOROLAC TROMETHAMINE 30 MG/ML IJ SOLN: 15.0000 mg | Freq: Once | INTRAMUSCULAR | Status: DC | PRN

## 2011-10-28 MED ORDER — LACTATED RINGERS IV SOLN
INTRAVENOUS | Status: DC | PRN
  Administered 2011-10-28: 14:00:00 via INTRAVENOUS

## 2011-10-28 MED ORDER — SODIUM CHLORIDE 0.9 % IR SOLN
Status: DC | PRN
  Administered 2011-10-28: 3000 mL

## 2011-10-28 MED ORDER — MIDAZOLAM HCL 5 MG/5ML IJ SOLN
INTRAMUSCULAR | Status: DC | PRN
  Administered 2011-10-28: 1 mg via INTRAVENOUS

## 2011-10-28 MED ORDER — PROMETHAZINE HCL 25 MG/ML IJ SOLN: 6.2500 mg | INTRAMUSCULAR | Status: DC | PRN

## 2011-10-28 MED ORDER — FENTANYL CITRATE 0.05 MG/ML IJ SOLN
INTRAMUSCULAR | Status: DC | PRN
  Administered 2011-10-28: 50 ug via INTRAVENOUS

## 2011-10-28 SURGICAL SUPPLY — 33 items
ADAPTER CATH URET PLST 4-6FR (CATHETERS) ×3 IMPLANT
BAG URO CATCHER STRL LF (DRAPE) ×3 IMPLANT
BASKET LASER NITINOL 1.9FR (BASKET) ×3 IMPLANT
BASKET ZERO TIP NITINOL 2.4FR (BASKET) IMPLANT
CATH INTERMIT  6FR 70CM (CATHETERS) IMPLANT
CATH ROBINSON RED A/P 16FR (CATHETERS) IMPLANT
CATH URET 5FR 28IN OPEN ENDED (CATHETERS) ×3 IMPLANT
CLOTH BEACON ORANGE TIMEOUT ST (SAFETY) ×3 IMPLANT
DRAPE CAMERA CLOSED 9X96 (DRAPES) ×3 IMPLANT
ELECT REM PT RETURN 9FT ADLT (ELECTROSURGICAL) ×3
ELECTRODE REM PT RTRN 9FT ADLT (ELECTROSURGICAL) ×2 IMPLANT
GLOVE BIOGEL PI IND STRL 7.5 (GLOVE) ×2 IMPLANT
GLOVE BIOGEL PI INDICATOR 7.5 (GLOVE) ×1
GLOVE ECLIPSE 7.5 STRL STRAW (GLOVE) ×3 IMPLANT
GLOVE SURG SS PI 8.0 STRL IVOR (GLOVE) ×3 IMPLANT
GOWN PREVENTION PLUS XLARGE (GOWN DISPOSABLE) ×6 IMPLANT
GOWN STRL NON-REIN LRG LVL3 (GOWN DISPOSABLE) ×3 IMPLANT
GOWN STRL REIN XL XLG (GOWN DISPOSABLE) ×3 IMPLANT
GUIDEWIRE ANG ZIPWIRE 038X150 (WIRE) ×3 IMPLANT
GUIDEWIRE STR DUAL SENSOR (WIRE) ×6 IMPLANT
LASER FIBER DISP (UROLOGICAL SUPPLIES) ×3 IMPLANT
MANIFOLD NEPTUNE II (INSTRUMENTS) ×3 IMPLANT
MARKER SKIN DUAL TIP RULER LAB (MISCELLANEOUS) ×3 IMPLANT
NDL SAFETY ECLIPSE 18X1.5 (NEEDLE) IMPLANT
NEEDLE HYPO 18GX1.5 SHARP (NEEDLE)
NEEDLE HYPO 22GX1.5 SAFETY (NEEDLE) IMPLANT
PACK CYSTO (CUSTOM PROCEDURE TRAY) ×3 IMPLANT
SET WARMING FLUID IRRIGATION (MISCELLANEOUS) ×3 IMPLANT
STENT CONTOUR 6FRX24X.038 (STENTS) ×3 IMPLANT
SYRINGE IRR TOOMEY STRL 70CC (SYRINGE) ×3 IMPLANT
TUBING CONNECTING 10 (TUBING) ×3 IMPLANT
WATER STERILE IRR 3000ML UROMA (IV SOLUTION) ×3 IMPLANT
WIRE COONS/BENSON .038X145CM (WIRE) ×3 IMPLANT

## 2011-10-28 NOTE — Preoperative (Signed)
Beta Blockers   Reason not to administer Beta Blockers:Not Applicable 

## 2011-10-28 NOTE — Discharge Instructions (Signed)
DISCHARGE INSTRUCTIONS FOR KIDNEY STONES OR URETERAL STENT  MEDICATIONS:   1. DO NOT RESUME YOUR ASPIRIN, or any other medicines like ibuprofen, motrin, excedrin, advil, aleve, vitamin E, fish oil as these can all cause bleeding x 5 days.  2. Resume all your other meds from home - except do not take any other pain meds that you may have at home.  ACTIVITY 1. No strenuous activity x 1week 2. No driving while on narcotic pain medications 3. Drink plenty of water 4. Continue to walk at home - you can still get blood clots when you are at home, so keep active, but don't over do it. 5. May return to work in 3 days.  BATHING 1. You can shower. Do not take a bath until the stent is removed. 2. You have a string coming out of your urethra:  The stent string is attached to your ureteral stent.  Do not pull on this. If you accidentally pull it and the stent comes part of the way out, go ahead and remove the entire stent.   SIGNS/SYMPTOMS TO CALL: 1. Please call us if you have a fever greater than 101.5, uncontrolled  nausea/vomiting, uncontrolled pain, dizziness, unable to urinate, chest pain, shortness of breath, leg swelling, leg pain, redness around wound, drainage from wound, or any other concerns or questions.  You can reach [place] at [phone]   Follow up: on the day of your follow up, pull the strings on your stent until the entire stent is removed.  Do this early in the morning and keep your appointment in the afternoon for evaluation.

## 2011-10-28 NOTE — H&P (Signed)
Urology History and Physical Exam  CC: Right ureter stone. UTI. Bladder lesion.  HPI: 71 year old female.  Patient with history of right nephrolithiasis.  Had right SWL.  She passed several fragments, but some of those fragments have become obstructed in her right ureter.  This is associated with a UTI. She had a right ureter stent placement last week.  She presents for cystoscopy, right ureteroscopy, laser lithotripsy, right ureter stent replacement, and bladder biopsy.  She had an erythematous lesion in her bladder on office cystoscopy previously and I have recommended biopsy.  We have discussed risks, benefits, alternatives, and likelihood of achieving her goals.  PMH: Past Medical History  Diagnosis Date  . Syncope   . Edema   . Dizziness   . Hemorrhoids, internal   . Lactose intolerance   . IBS (irritable bowel syndrome)   . Dyslipidemia   . Hypertension   . Constipation   . Amyloid disease   . Chronic kidney disease     kidney stone  . CHF (congestive heart failure) SECONDARY TO AMYLIODOSIS  . Coronary artery disease CARDIOLOGIST- DR HOCHREIN-- LAST VISIT 08-07-11 IN [facility]  . S/P hemorrhoidectomy   . Right ureteral stone   . Arthritis   . Carotid stenosis, bilateral PER DUPLEX  04-10-2011    RICA  40-59%/  LICA 60-79%  . Cardiomyopathy secondary EF 30%  . Atrial fibrillation   . Hypercholesterolemia   . Spinal stenosis   . Neuromuscular disorder     neuropathy    PSH: Past Surgical History  Procedure Date  . Hemorrhoid surgery 2002  . Carpal tunnel release 2005    RIGHT  . Cysto/ bladder bx and fulgeration 02-21-2007  . Right sural nerve bx/ right gastrocemius muscle bx 09-25-2009  . Breast biopsy 1992  . Appendectomy 1961  . Extracorporeal shock wave lithotripsy 08-10-2011    RIGHT  . Transthoracic echocardiogram 11-24-2010    LVEF 35% WITH DIFFUSE HYPOKINESIS, WORSE IN THE BASE/MID ANTERIOR WALL AND LATERAL WALL/ BASAL INFERIOR/ INFEROSEPTAL AKINESIS/  CAVITY  SIZE NORMAL/ WALL THICKNESS WAS INCREASED IN A PATTERN OF SEVERE LVH/ PAPAMETERS CONSISTENT WITH AN IRREVERSIBLE RESTRICTIVE PATTERN/  GRADE 4 DIASTOLIC DYSFUNCTION/ MILD TO MOD. MV REGUR./ BIL. ATRIUM MOD. DILATED/ RVSF MOD. REDUCED/ SMALL PERICARDIAL EFFUSION  . Cystoscopy 09/2011  . Tonsillectomy 10-23-11    child    Allergies: Allergies  Allergen Reactions  . Statins Other (See Comments)    LEG CRAMPS     Medications: Prescriptions prior to admission  Medication Sig Dispense Refill  . Alpha-D-Galactosidase (BEANO PO) Take 1-2 tablets by mouth 2 (two) times daily as needed. GAS       . Calcium Citrate-Vitamin D (CITRACAL + D PO) Take 2 tablets by mouth daily.       . ciprofloxacin (CIPRO) 250 MG tablet Take 250 mg by mouth daily.      . diflunisal (DOLOBID) 500 MG TABS Take 250 mg by mouth 2 (two) times daily.      Marland Kitchen docusate sodium (COLACE) 100 MG capsule Take 100 mg by mouth 2 (two) times daily.       Marland Kitchen esomeprazole (NEXIUM) 40 MG capsule Take 40 mg by mouth 2 (two) times daily.       . ferrous sulfate 325 (65 FE) MG tablet Take 325 mg by mouth daily with breakfast.       . furosemide (LASIX) 20 MG tablet Take 20 mg by mouth daily as needed. SWELLING.  PT CAN TAKE  UP TO 120 MG. PT DOES HAVE A RX FOR BOTH 40 MG AND 20 MG.       . furosemide (LASIX) 40 MG tablet Take 40-120 mg by mouth daily as needed. SWELLING.  PT CAN TAKE UP TO 120 MG. PT DOES HAVE A RX FOR BOTH 40 MG AND 20 MG.        . gabapentin (NEURONTIN) 100 MG capsule Take 100-300 mg by mouth 3 (three) times daily. 100 mg twice a day and 200 mg- 300 mg at bedtime        . HYDROcodone-acetaminophen (NORCO) 5-325 MG per tablet Take 1-2 tablets by mouth every 4 (four) hours as needed for pain.  40 tablet  0  . Hypromellose (GENTEAL) 0.3 % SOLN Place 1 drop into both eyes 2 (two) times daily as needed. DRY EYES       . loperamide (IMODIUM) 2 MG capsule Take 2 mg by mouth 2 (two) times daily.       . metoprolol succinate  (TOPROL-XL) 25 MG 24 hr tablet Take 1 tablet (25 mg total) by mouth 2 (two) times daily.  60 tablet  11  . potassium chloride (K-DUR) 10 MEQ tablet Take 10-30 mEq by mouth daily as needed. FLUID       . Probiotic Product (ALIGN PO) Take 1 capsule by mouth daily.       . Simethicone 180 MG CAPS Take 1-2 capsules by mouth 2 (two) times daily as needed. GAS       . cyanocobalamin (,VITAMIN B-12,) 1000 MCG/ML injection Inject 1,000 mcg into the muscle every 30 (thirty) days.      . hyoscyamine (LEVSIN, ANASPAZ) 0.125 MG tablet Take 1 tablet (0.125 mg total) by mouth every 4 (four) hours as needed for cramping (bladder spasms).  40 tablet  4  . Multiple Vitamins-Minerals (MULTIVITAMINS THER. W/MINERALS) TABS Take 1 tablet by mouth daily.       . Nutritional Supplements (CARNATION INSTANT BREAKFAST PO) Take 1 Package by mouth 2 (two) times daily.        Marland Kitchen omeprazole (PRILOSEC) 20 MG capsule Take 20 mg by mouth daily.      Marland Kitchen warfarin (COUMADIN) 2.5 MG tablet Take 2.5-5 mg by mouth daily. 5 mg on Tuesday and Saturday and all other days is 2.5 mg         Social History: History   Social History  . Marital Status: Divorced    Spouse Name: N/A    Number of Children: N/A  . Years of Education: N/A   Occupational History  . Not on file.   Social History Main Topics  . Smoking status: Former Smoker -- 20 years    Types: Cigarettes    Quit date: 10/28/1990  . Smokeless tobacco: Never Used  . Alcohol Use: No  . Drug Use: No  . Sexually Active: Not on file   Other Topics Concern  . Not on file   Social History Narrative    The patient is retired.  She is divorced.  She has 2  children.  She smoked 3 packs of cigarettes a day for 20 years but quit > 20 years ago.  She occasionally drinks wine.Family History: Noncontributory for early coronary disease.  Both her  parents died at later ages of heart failure.    Family History: Family History  Problem Relation Age of Onset  . Heart disease  Mother     Review of Systems: Positive: Fatigue Negative: Fever, Chest Pain,  SOB.  A further 10 point review of systems was negative except what is listed in the HPI.  Physical Exam:  General: No acute distress.  Awake. Head:  Normocephalic.  Atraumatic. ENT:  EOMI.  Mucous membranes moist Neck:  Supple.  No lymphadenopathy. CV:  S1 present. S2 present. Regular rate. Pulmonary: Equal effort bilaterally.  Clear to auscultation bilaterally. Abdomen: Soft.  None- tender to palpation. Skin:  Normal turgor.  No visible rash. Extremity: No gross deformity of bilateral upper extremities.  No gross deformity of bilateral lower extremities. Neurologic: Alert. Appropriate mood.   Studies:  No results found for this basename: HGB:2,WBC:2,PLT:2 in the last 72 hours  No results found for this basename: NA:2,K:2,CL:2,CO2:2,BUN:2,CREATININE:2,CALCIUM:2,MAGNESIUM:2,GFRNONAA:2,GFRAA:2 in the last 72 hours   No results found for this basename: PT:2,INR:2,APTT:2 in the last 72 hours   No components found with this basename: ABG:2    Assessment:  Right ureter stone.  Bladder lesion.  Plan: -  To OR for cystoscopy, right ureteroscopy, laser lithotripsy, right ureter stent replacement, and bladder biopsy.

## 2011-10-28 NOTE — Anesthesia Postprocedure Evaluation (Signed)
  Anesthesia Post-op Note  Patient: Miranda [name]  Procedure(s) Performed: Procedure(s) (LRB): CYSTOSCOPY WITH URETEROSCOPY (Right) HOLMIUM LASER APPLICATION (Right) CYSTOSCOPY WITH STENT PLACEMENT (Right)  Patient Location: PACU  Anesthesia Type: General  Level of Consciousness: awake and alert   Airway and Oxygen Therapy: Patient Spontanous Breathing  Post-op Pain: mild  Post-op Assessment: Post-op Vital signs reviewed, Patient's Cardiovascular Status Stable, Respiratory Function Stable, Patent Airway and No signs of Nausea or vomiting  Post-op Vital Signs: stable  Complications: No apparent anesthesia complications

## 2011-10-28 NOTE — Transfer of Care (Signed)
Immediate Anesthesia Transfer of Care Note  Patient: Miranda [name]  Procedure(s) Performed: Procedure(s) (LRB): CYSTOSCOPY WITH URETEROSCOPY (Right) HOLMIUM LASER APPLICATION (Right) CYSTOSCOPY WITH STENT PLACEMENT (Right)  Patient Location: PACU  Anesthesia Type: General  Level of Consciousness: awake, alert , oriented and patient cooperative  Airway & Oxygen Therapy: Patient Spontanous Breathing and Patient connected to face mask oxygen  Post-op Assessment: Report given to PACU RN, Post -op Vital signs reviewed and stable and Patient moving all extremities X 4  Post vital signs: Reviewed and stable  Complications: No apparent anesthesia complications

## 2011-10-28 NOTE — Anesthesia Preprocedure Evaluation (Addendum)
Anesthesia Evaluation  Patient identified by MRN, date of birth, ID band Patient awake    Reviewed: Allergy & Precautions, H&P , NPO status , Patient's Chart, lab work & pertinent test results  Airway Mallampati: II TM Distance: >3 FB Neck ROM: Full    Dental No notable dental hx.    Pulmonary neg pulmonary ROS,  breath sounds clear to auscultation  Pulmonary exam normal       Cardiovascular hypertension, + CAD and +CHF Rhythm:Regular Rate:Normal  Stage IV diastolic failure Ef 35% Fluid restrict    Neuro/Psych Anxiety negative neurological ROS  negative psych ROS   GI/Hepatic negative GI ROS, Neg liver ROS,   Endo/Other  negative endocrine ROS  Renal/GU negative Renal ROS  negative genitourinary   Musculoskeletal negative musculoskeletal ROS (+)   Abdominal   Peds negative pediatric ROS (+)  Hematology negative hematology ROS (+)   Anesthesia Other Findings   Reproductive/Obstetrics negative OB ROS                          Anesthesia Physical Anesthesia Plan  ASA: III  Anesthesia Plan: General   Post-op Pain Management:    Induction: Intravenous  Airway Management Planned: LMA  Additional Equipment:   Intra-op Plan:   Post-operative Plan:   Informed Consent: I have reviewed the patients History and Physical, chart, labs and discussed the procedure including the risks, benefits and alternatives for the proposed anesthesia with the patient or authorized representative who has indicated his/her understanding and acceptance.   Dental advisory given  Plan Discussed with: CRNA  Anesthesia Plan Comments:         Anesthesia Quick Evaluation

## 2011-10-28 NOTE — Brief Op Note (Signed)
10/28/2011  4:11 PM  PATIENT:  DONNELL BEAUCHAMP  71 y.o. female  PRE-OPERATIVE DIAGNOSIS:  Right Ureter Stones, Bladder Lesion  POST-OPERATIVE DIAGNOSIS:  Right Ureter Stones  PROCEDURE:  Procedure(s) (LRB): CYSTOSCOPY Right ureter stent removal Right URETEROSCOPY  HOLMIUM LASER Lithotripsy (Right) Right ureter stent placement Fluoroscopy  SURGEON:  Surgeon(s) and Role:    * Milford Cage, MD - Primary  PHYSICIAN ASSISTANT:   ASSISTANTS: none   ANESTHESIA:   general  EBL:  Total I/O In: 450 [I.V.:450] Out: -   BLOOD ADMINISTERED:none  DRAINS: none   LOCAL MEDICATIONS USED:  NONE  SPECIMEN:  Source of Specimen:  Right ureter  DISPOSITION OF SPECIMEN:  Given to patient.  COUNTS:  YES  TOURNIQUET:  * No tourniquets in log *  DICTATION: .Other Dictation: Dictation Number [id number]  PLAN OF CARE: Discharge to home after PACU  PATIENT DISPOSITION:  PACU - hemodynamically stable.   Delay start of Pharmacological VTE agent (>24hrs) due to surgical blood loss or risk of bleeding: yes

## 2011-10-29 ENCOUNTER — Ambulatory Visit: Payer: Medicare Other | Admitting: Neurology

## 2011-10-29 MED FILL — Mupirocin Oint 2%: CUTANEOUS | Qty: 22 | Status: AC

## 2011-10-29 NOTE — Op Note (Signed)
NAME:  Miranda [name], Miranda [name]  [id number]  MEDICAL RECORD NO.:  [id number]  LOCATION:  [facility]                         FACILITY:  [facility]  PHYSICIAN:  Natalia Leatherwood, MD    DATE OF BIRTH:  03-Feb-1941  DATE OF PROCEDURE:  10/28/2011 DATE OF DISCHARGE:  10/28/2011                              OPERATIVE REPORT   SURGEON:  Natalia Leatherwood, MD  PREOPERATIVE DIAGNOSES: 1. Right ureteral stone. 2. Bladder lesion.  POSTOPERATIVE DIAGNOSIS:  Right ureteral stone.  PROCEDURES PERFORMED:  Cystoscopy, panendoscopy, right ureteroscopy, laser lithotripsy, right ureteral stent exchange, fluoroscopy less than 1 hour.  SPECIMEN:  Stones removed from the ureter and disposed off.  FINDINGS:  There are no bladder lesions in the bladder, distal right impacted ureteral stone, and distal right ureteral edema.  No proximal ureteral stones.  COMPLICATIONS:  None.  DRAINS:  None.  HISTORY OF PRESENT ILLNESS:  This is a 71 year old female who had a right nephrolithiasis.  She elects to undergo shock wave lithotripsy for this stone and following this, she was able to pass several fragments; however, some of the fragments became lodged in her distal right ureter. She did develop urinary tract infection and it was felt that a stent needs to be placed to allow drainage to prevent upper tract infection. Ureteral stent was placed last week.  She presents today for interval ureteroscopy for stone removal.  She also had a history of an erythematous area in her bladder on office cystoscopy.  We plan on doing a biopsy on this area while in the operating room.  DESCRIPTION OF PROCEDURE:  After informed consent was obtained, the patient taken to the operating room, where she was placed in supine position.  IV antibiotics were infused and general anesthesia was induced.  She was then placed in a dorsal lithotomy position where her genitals were prepped and draped in usual sterile fashion.   Following this, time-out was performed with correct patient, surgical site, and procedure were all identified and agreed upon by the team.  All neurovascular pressure points were padded appropriately.  The cystoscope was advanced through the urethra into the bladder.  The bladder was evaluated in systematic fashion.  There were no lesions noted throughout the bladder.  The entirety of the mucosa of the bladder was visualized. Following this, attention was turned to the right ureteral orifice.  It was difficult to pass any wires around this including a sensor-tip wire or an angled Glidewire.  Following this, stent graspers were used to grasp the tip of the stent and pulled to the urethral orifice.  A sensor wire was placed up through the stent into the right renal pelvis with a good curl on fluoroscopy.  Following this, the stent was removed and then cystoscope was replaced and a 5-French open-ended ureteral Pollock catheter was placed to the ureteral orifice.  An angle-tipped Glidewire was able to be threaded past the stones up into the right renal pelvis under fluoroscopy.  Next, a 5-French catheter was placed over this up into the renal pelvis and the Glidewire was removed and a sensor-tip wire was replaced through the catheter into the right renal pelvis on fluoroscopy.  The ureteral  catheter was removed and wire was secured as a safety wire.  The other was used as a working wire.  Next, a semi- rigid ureteroscope was then passed through the urethra into the bladder and into the ureter.  The stones were impacted and unable to be removed and therefore a 200-micron holmium laser wire was placed through the semi-rigid scope.  Lithotripsy was carried out until the stones fragmented adequately to be removed.  Following this, an escape basket was used to grasp the stones and these were all removed and placed into the bladder.  Next, the ureteroscope was taken all the way up to the renal  pelvis.  There were no stones in the renal pelvis; however, these were unable to be grasped due to using the semi-rigid scope.  There were no other stones noted in the ureter.  The ureter was wide open into the distal ureter, which had a significant amount of edema.  Because of this, it is felt that another ureteral stent needed to be left in place. The entire ureter was evaluated and there were no injuries to the ureter.  One of the wires was removed with 1 sensor-tip wire in place. This wire was placed through a cystoscope, which was placed back through the urethra into the bladder.  A 6 x 24 double-J ureteral stent was placed over the wire with ease with a good curl noted in the right renal pelvis on fluoroscopy.  There were strings left in place on this stent. The stent was deployed in the bladder and the bladder was drained.  The string was secured to the patient's abdomen.  This completed the procedure.  She has placed back in supine position.  Anesthesia was reversed.  She was taken to PACU in stable condition.          ______________________________ Natalia Leatherwood, MD     DW/MEDQ  D:  10/28/2011  T:  10/29/2011  Job:  [id number]

## 2011-10-30 ENCOUNTER — Encounter (HOSPITAL_COMMUNITY): Payer: Self-pay | Admitting: Urology

## 2011-11-11 ENCOUNTER — Other Ambulatory Visit: Payer: Self-pay | Admitting: *Deleted

## 2011-11-11 ENCOUNTER — Ambulatory Visit (INDEPENDENT_AMBULATORY_CARE_PROVIDER_SITE_OTHER): Payer: Medicare Other | Admitting: *Deleted

## 2011-11-11 DIAGNOSIS — Z7901 Long term (current) use of anticoagulants: Secondary | ICD-10-CM

## 2011-11-11 DIAGNOSIS — I429 Cardiomyopathy, unspecified: Secondary | ICD-10-CM

## 2011-11-11 DIAGNOSIS — I2510 Atherosclerotic heart disease of native coronary artery without angina pectoris: Secondary | ICD-10-CM

## 2011-11-11 DIAGNOSIS — I498 Other specified cardiac arrhythmias: Secondary | ICD-10-CM

## 2011-11-11 LAB — POCT INR: INR: 1.8

## 2011-11-18 ENCOUNTER — Other Ambulatory Visit: Payer: Self-pay

## 2011-11-18 MED ORDER — DIFLUNISAL 500 MG PO TABS: 250.0000 mg | ORAL_TABLET | Freq: Two times a day (BID) | ORAL | Status: DC

## 2011-11-19 ENCOUNTER — Ambulatory Visit (INDEPENDENT_AMBULATORY_CARE_PROVIDER_SITE_OTHER): Payer: Medicare Other | Admitting: *Deleted

## 2011-11-19 ENCOUNTER — Other Ambulatory Visit (INDEPENDENT_AMBULATORY_CARE_PROVIDER_SITE_OTHER): Payer: Medicare Other

## 2011-11-19 DIAGNOSIS — I429 Cardiomyopathy, unspecified: Secondary | ICD-10-CM

## 2011-11-19 DIAGNOSIS — I2510 Atherosclerotic heart disease of native coronary artery without angina pectoris: Secondary | ICD-10-CM

## 2011-11-19 DIAGNOSIS — I498 Other specified cardiac arrhythmias: Secondary | ICD-10-CM

## 2011-11-19 DIAGNOSIS — Z7901 Long term (current) use of anticoagulants: Secondary | ICD-10-CM

## 2011-11-19 LAB — BASIC METABOLIC PANEL
BUN: 24 mg/dL — ABNORMAL HIGH (ref 6–23)
CO2: 32 mEq/L (ref 19–32)
Calcium: 8.8 mg/dL (ref 8.4–10.5)
Creatinine, Ser: 0.9 mg/dL (ref 0.4–1.2)

## 2011-11-19 LAB — POCT INR: INR: 4.3

## 2011-11-27 ENCOUNTER — Encounter: Payer: Self-pay | Admitting: Neurology

## 2011-11-27 ENCOUNTER — Ambulatory Visit (INDEPENDENT_AMBULATORY_CARE_PROVIDER_SITE_OTHER): Payer: Medicare Other | Admitting: Pharmacist

## 2011-11-27 ENCOUNTER — Ambulatory Visit (INDEPENDENT_AMBULATORY_CARE_PROVIDER_SITE_OTHER): Payer: Medicare Other | Admitting: Neurology

## 2011-11-27 VITALS — BP 114/68 | HR 76 | Wt 135.0 lb

## 2011-11-27 DIAGNOSIS — Z7901 Long term (current) use of anticoagulants: Secondary | ICD-10-CM

## 2011-11-27 DIAGNOSIS — R531 Weakness: Secondary | ICD-10-CM

## 2011-11-27 DIAGNOSIS — R5381 Other malaise: Secondary | ICD-10-CM

## 2011-11-27 DIAGNOSIS — I498 Other specified cardiac arrhythmias: Secondary | ICD-10-CM

## 2011-11-27 LAB — POCT INR: INR: 3.4

## 2011-11-27 NOTE — Progress Notes (Signed)
Dear Dr. 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  As you may recall, she is a 71 y.o. year old female with a history of progressive sensory changes with "prickling sensation" in her hands and feet with progressive weakness.  She also has an amyloid cardiomyopathy and atrial fibrillation with a recent EF estimate by echo of 20-25%.  She has some autonomic dysfunction as well including early satiety, constipation alternating with diarrhea.  However, she denies urinary incontinence, orthostatic lightheadedness.  The sensory changes in her feet started about 5 years ago.  She had an extensive workup at [facility] including LP which revealed mildly elevated protein - however, IVIG did not affect her weakness.  Nerve biopsy revealed minimal amyloid deposition.  Genetic testing for transthyretin mutation was positive.  She has been seen at the [facility] amyloidosis clinic.  She has been using diflunisal off-label as prescribed by the amyloidosis clinic.  She uses gabapentin 100/100/300 but is unsure whether it helps with the prickling in her hands and feet.  Notably she does not find this prickling that bothersome.  She is also concerned that the gabapentin is causing fluid retention.  She has continued to have significant motor dysfunction.  She now uses a rolling walker all the time(since 2011), because of weakness in her legs.  She has not had physical therapy for several years.   Past Medical History  Diagnosis Date  . Syncope   . Edema   . Dizziness   . Hemorrhoids, internal   . Lactose intolerance   . IBS (irritable bowel syndrome)   . Dyslipidemia   . Hypertension   . Constipation   . Amyloid disease   . Chronic kidney disease     kidney stone  . CHF (congestive heart failure) SECONDARY TO AMYLIODOSIS  . Coronary artery disease  CARDIOLOGIST- DR HOCHREIN-- LAST VISIT 08-07-11 IN [facility]  . S/P hemorrhoidectomy   . Right ureteral stone   . Arthritis   . Carotid stenosis, bilateral PER DUPLEX  04-10-2011    RICA  40-59%/  LICA 60-79%  . Cardiomyopathy secondary EF 30%  . Atrial fibrillation   . Hypercholesterolemia   . Spinal stenosis   . Neuromuscular disorder     neuropathy    Past Surgical History  Procedure Date  . Hemorrhoid surgery 2002  . Carpal tunnel release 2005    RIGHT  . Cysto/ bladder bx and fulgeration 02-21-2007  . Right sural nerve bx/ right gastrocemius muscle bx 09-25-2009  . Breast biopsy 1992  . Appendectomy 1961  . Extracorporeal shock wave lithotripsy 08-10-2011    RIGHT  . Transthoracic echocardiogram 11-24-2010    LVEF 35% WITH DIFFUSE HYPOKINESIS, WORSE IN THE BASE/MID ANTERIOR WALL AND LATERAL WALL/ BASAL INFERIOR/ INFEROSEPTAL AKINESIS/  CAVITY SIZE NORMAL/ WALL THICKNESS WAS INCREASED IN A PATTERN OF SEVERE LVH/ PAPAMETERS CONSISTENT WITH AN IRREVERSIBLE RESTRICTIVE PATTERN/  GRADE 4 DIASTOLIC DYSFUNCTION/ MILD TO MOD. MV REGUR./ BIL. ATRIUM MOD. DILATED/ RVSF MOD. REDUCED/ SMALL PERICARDIAL EFFUSION  . Cystoscopy 09/2011  . Tonsillectomy 10-23-11    child  . Cystoscopy with ureteroscopy 10/28/2011    Procedure: CYSTOSCOPY WITH URETEROSCOPY;  Surgeon: Milford Cage, MD;  Location: [facility];  Service: Urology;  Laterality: Right;    History   Social History  . Marital Status: Divorced    Spouse Name: N/A    Number  of Children: N/A  . Years of Education: N/A   Social History Main Topics  . Smoking status: Former Smoker -- 20 years    Types: Cigarettes    Quit date: 10/28/1990  . Smokeless tobacco: Never Used  . Alcohol Use: No  . Drug Use: No  . Sexually Active: None   Other Topics Concern  . None   Social History Narrative    The patient is retired.  She is divorced.  She has 2  children.  She smoked 3 packs of cigarettes a day for 20 years but quit > 20 years  ago.  She occasionally drinks wine.Family History: Noncontributory for early coronary disease.  Both her  parents died at later ages of heart failure.    Family History  Problem Relation Age of Onset  . Heart disease Mother   - 2 children are negative for the transthyretin mutation -   Current Outpatient Prescriptions on File Prior to Visit  Medication Sig Dispense Refill  . Alpha-D-Galactosidase (BEANO PO) Take 1-2 tablets by mouth 2 (two) times daily as needed. GAS       . Calcium Citrate-Vitamin D (CITRACAL + D PO) Take 2 tablets by mouth daily.       . cyanocobalamin (,VITAMIN B-12,) 1000 MCG/ML injection Inject 1,000 mcg into the muscle every 30 (thirty) days.      . diflunisal (DOLOBID) 500 MG TABS Take 0.5 tablets (250 mg total) by mouth 2 (two) times daily.  60 tablet  6  . docusate sodium (COLACE) 100 MG capsule Take 100 mg by mouth 2 (two) times daily.       Marland Kitchen esomeprazole (NEXIUM) 40 MG capsule Take 40 mg by mouth 2 (two) times daily.       . furosemide (LASIX) 20 MG tablet Take 20 mg by mouth daily as needed. SWELLING.  PT CAN TAKE UP TO 120 MG. PT DOES HAVE A RX FOR BOTH 40 MG AND 20 MG.       . furosemide (LASIX) 40 MG tablet Take 40-120 mg by mouth daily as needed. SWELLING.  PT CAN TAKE UP TO 120 MG. PT DOES HAVE A RX FOR BOTH 40 MG AND 20 MG.        . gabapentin (NEURONTIN) 100 MG capsule Take 100-300 mg by mouth 3 (three) times daily. 100 mg twice a day and 200 mg- 300 mg at bedtime        . Hypromellose (GENTEAL) 0.3 % SOLN Place 1 drop into both eyes 2 (two) times daily as needed. DRY EYES       . loperamide (IMODIUM) 2 MG capsule Take 2 mg by mouth 2 (two) times daily.       . metoprolol succinate (TOPROL-XL) 25 MG 24 hr tablet Take 1 tablet (25 mg total) by mouth 2 (two) times daily.  60 tablet  11  . Multiple Vitamins-Minerals (MULTIVITAMINS THER. W/MINERALS) TABS Take 1 tablet by mouth daily.       . Nutritional Supplements (CARNATION INSTANT BREAKFAST PO) Take 1  Package by mouth 2 (two) times daily.        . potassium chloride (K-DUR) 10 MEQ tablet Take 10-30 mEq by mouth daily as needed. FLUID       . Probiotic Product (ALIGN PO) Take 1 capsule by mouth daily.       . Simethicone 180 MG CAPS Take 1-2 capsules by mouth 2 (two) times daily as needed. GAS       .  ferrous sulfate 325 (65 FE) MG tablet Take 325 mg by mouth daily with breakfast.       . hyoscyamine (LEVSIN, ANASPAZ) 0.125 MG tablet Take 1 tablet (0.125 mg total) by mouth every 4 (four) hours as needed for cramping (bladder spasms).  40 tablet  4    Allergies  Allergen Reactions  . Statins Other (See Comments)    LEG CRAMPS       ROS:  13 systems were reviewed and are notable for significant peripheral edema, attributed to partially to her CHF.  All other review of systems are unremarkable.   Examination:  Filed Vitals:   11/27/11 1534  BP: 114/68  Pulse: 76  Weight: 135 lb (61.236 kg)     In general, well appearing women.  Cardiovascular: The patient had no carotid bruits.  Extremities: +3 pedal and lower extremity edema.  Fundoscopy:  Disks are flat. Vessel caliber within normal limits.  Mental status:   The patient is oriented to person, place and time. Recent and remote memory are intact. Attention span and concentration are normal. Language including repetition, naming, following commands are intact. Fund of knowledge of current and historical events, as well as vocabulary are normal.  Cranial Nerves: Pupils are equally round and reactive to light. Visual fields full to confrontation. Extraocular movements are intact without nystagmus. Facial sensation and muscles of mastication are intact. Muscles of facial expression are symmetric. Hearing intact to bilateral finger rub. Tongue protrusion, uvula, palate midline.  Shoulder shrug intact.  Neck flexion and neck extension intact.  Motor:  The patient has decrease bulk in hands, difficult to assess in feet and  normal tone, no pronator drift.  There are no adventitious movements. Muscle Exam   Shoulder Ab Arm Flex Arm Ext Wrist Flex Wrist Ext Finger Ab Hip Flex KE KF FDF  Right  4+ 5 4+ 5 5 5  5- 5 4 5-  Left 4+ 5 4+ 5 5 5  5- 5 4 5-     Reflexes:   Biceps  Triceps Brachioradialis Knee Ankle  Right 1+  1+  1+   0 0  Left  1+  1+  1+   0 0  Toes down  Coordination:  Normal finger to nose.  No dysdiadokinesia.  Sensation is decreased to temperature and vibration in a length dependent manner in hands and feet.  Position sense relatively intact.  Gait and Station are wide based with bilateral slapping gait.    Impression/Recs:  1.  Familial amyloid polyneuropathy - She is going to try to decrease her gabapentin to see if it is helping her "prickling" sensations.  I also want her seen in the gait clinic at the neuro-rehab center to see if they have any additional therapy that might help her. 2.  CHF - She is going to follow up with Dr. Antoine Poche about her cardiomyopathy.  I suspect she will need ICD because of her reduced ejection fraction.    We will see the patient back in 3 months.  Thank you for having [place] see Miranda [name] in consultation.  Feel free to contact me with any questions.  Lupita Raider Modesto Charon, MD [facility], [place] [address] [place], [place] [address] Phone: [phone] Fax: [phone].

## 2011-12-01 ENCOUNTER — Other Ambulatory Visit: Payer: Self-pay

## 2011-12-01 MED ORDER — FUROSEMIDE 20 MG PO TABS: 20.0000 mg | ORAL_TABLET | Freq: Every day | ORAL | Status: DC | PRN

## 2011-12-01 NOTE — Telephone Encounter (Signed)
..   Requested Prescriptions   Signed Prescriptions Disp Refills  . furosemide (LASIX) 20 MG tablet 30 tablet 10    Sig: Take 1 tablet (20 mg total) by mouth daily as needed. SWELLING.  PT CAN TAKE UP TO 120 MG. PT DOES HAVE A RX FOR BOTH 40 MG AND 20 MG.    Authorizing Provider: Rollene Rotunda    Ordering User: Christella Hartigan, Kaiven Vester Judie Petit

## 2011-12-08 ENCOUNTER — Ambulatory Visit (INDEPENDENT_AMBULATORY_CARE_PROVIDER_SITE_OTHER): Payer: Medicare Other

## 2011-12-08 DIAGNOSIS — I498 Other specified cardiac arrhythmias: Secondary | ICD-10-CM

## 2011-12-08 DIAGNOSIS — Z7901 Long term (current) use of anticoagulants: Secondary | ICD-10-CM

## 2011-12-15 ENCOUNTER — Encounter: Payer: Self-pay | Admitting: Cardiology

## 2011-12-15 NOTE — Telephone Encounter (Signed)
error 

## 2011-12-17 ENCOUNTER — Ambulatory Visit (INDEPENDENT_AMBULATORY_CARE_PROVIDER_SITE_OTHER): Payer: Medicare Other | Admitting: Cardiology

## 2011-12-17 ENCOUNTER — Ambulatory Visit (INDEPENDENT_AMBULATORY_CARE_PROVIDER_SITE_OTHER): Payer: Medicare Other | Admitting: Pharmacist

## 2011-12-17 ENCOUNTER — Encounter: Payer: Self-pay | Admitting: Cardiology

## 2011-12-17 VITALS — BP 100/55 | HR 66 | Ht 65.0 in | Wt 136.0 lb

## 2011-12-17 DIAGNOSIS — I498 Other specified cardiac arrhythmias: Secondary | ICD-10-CM

## 2011-12-17 DIAGNOSIS — Z7901 Long term (current) use of anticoagulants: Secondary | ICD-10-CM

## 2011-12-17 DIAGNOSIS — I6529 Occlusion and stenosis of unspecified carotid artery: Secondary | ICD-10-CM

## 2011-12-17 DIAGNOSIS — I428 Other cardiomyopathies: Secondary | ICD-10-CM

## 2011-12-17 NOTE — Assessment & Plan Note (Addendum)
I will have her try to take 25 mg of Toprol at night.  She is tolerating the anticoagulation.

## 2011-12-17 NOTE — Assessment & Plan Note (Signed)
She has moderate carotid stenosis and we will continue to follow this.

## 2011-12-17 NOTE — Assessment & Plan Note (Signed)
We had a long discussion about this and the fact that her ejection fraction is slightly lower. However, she understands that treatment his medications in that she is not tolerating the titration. I have also reviewed the possibility of an ICD with Dr. Graciela Husbands. However, given the underlying condition PMI both agree that this is not indicated. (Greater than 20 minutes with this procedure.)

## 2011-12-17 NOTE — Progress Notes (Signed)
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  The patient presents with no new complaints. She was in the hospital with atrial fibrillation after a urologic procedure.  She has now new SOB.  She hasn't been tolerating the increased dose of beta blocker started with her atrial fibrillation. She reduced the dose. Her blood pressure is very sensitive to this. She is currently only taking 12.5 Toprol XL at night.  She is not having pain or syncope.  She has had no further arrhythmias.   I did order an echo which demonstrated her EF is now slightly lower at 25%. The echo was mid to followup his CT which suggested a pericardial effusion. However, there was only trivial pericardial effusion on this study.   Allergies  Allergen Reactions  . Statins Other (See Comments)    LEG CRAMPS     Current Outpatient Prescriptions  Medication Sig Dispense Refill  . Alpha-D-Galactosidase (BEANO PO) Take 1-2 tablets by mouth 2 (two) times daily as needed. GAS       . Calcium Citrate-Vitamin D (CITRACAL + D PO) Take 2 tablets by mouth daily.       . diflunisal (DOLOBID) 500 MG TABS Take 0.5 tablets (250 mg total) by mouth 2 (two) times daily.  60 tablet  6  . docusate sodium (COLACE) 100 MG capsule Take 100 mg by mouth 2 (two) times daily.       Marland Kitchen esomeprazole (NEXIUM) 40 MG capsule Take 40 mg by mouth 2 (two) times daily.       . ferrous sulfate 325 (65 FE) MG tablet Take 325 mg by mouth daily with breakfast.       . furosemide (LASIX) 20 MG tablet Take 1 tablet (20 mg total) by mouth daily as needed. SWELLING.  PT CAN TAKE UP TO 120 MG. PT DOES HAVE A RX FOR BOTH 40 MG AND 20 MG.  30 tablet  10  . furosemide (LASIX) 40 MG tablet Take 40-120 mg by mouth daily as needed. SWELLING.  PT CAN TAKE UP TO 120 MG. PT DOES HAVE A RX FOR BOTH 40 MG AND 20 MG.        . gabapentin (NEURONTIN) 100 MG capsule Take 100-300 mg by mouth 3 (three) times daily. 100 mg twice a day and 200 mg- 300 mg at  bedtime        . Hypromellose (GENTEAL) 0.3 % SOLN Place 1 drop into both eyes 2 (two) times daily as needed. DRY EYES       . loperamide (IMODIUM) 2 MG capsule Take 2 mg by mouth 2 (two) times daily.       . metoprolol succinate (TOPROL-XL) 25 MG 24 hr tablet Take 1 tablet (25 mg total) by mouth 2 (two) times daily.  60 tablet  11  . Multiple Vitamins-Minerals (MULTIVITAMINS THER. W/MINERALS) TABS Take 1 tablet by mouth daily.       . Nutritional Supplements (CARNATION INSTANT BREAKFAST PO) Take 1 Package by mouth 2 (two) times daily.        . potassium chloride (K-DUR) 10 MEQ tablet Take 10-30 mEq by mouth daily as needed. FLUID       . Probiotic Product (ALIGN PO) Take 1 capsule by mouth daily.       . Simethicone 180 MG CAPS Take 1-2 capsules by mouth 2 (two) times daily as needed. GAS       . warfarin (COUMADIN) 2.5 MG tablet Take  2.5 mg by mouth. 2.5mg  qd except Saturdays, takes two tablets then.      . hyoscyamine (LEVSIN, ANASPAZ) 0.125 MG tablet Take 1 tablet (0.125 mg total) by mouth every 4 (four) hours as needed for cramping (bladder spasms).  40 tablet  4    Past Medical History  Diagnosis Date  . Syncope   . Edema   . Dizziness   . Hemorrhoids, internal   . Lactose intolerance   . IBS (irritable bowel syndrome)   . Dyslipidemia   . Hypertension   . Constipation   . Amyloid disease   . Chronic kidney disease     kidney stone  . CHF (congestive heart failure) SECONDARY TO AMYLIODOSIS  . Coronary artery disease CARDIOLOGIST- DR Kamri Gotsch-- LAST VISIT 08-07-11 IN [facility]  . S/P hemorrhoidectomy   . Right ureteral stone   . Arthritis   . Carotid stenosis, bilateral PER DUPLEX  04-10-2011    RICA  40-59%/  LICA 60-79%  . Cardiomyopathy secondary EF 30%  . Atrial fibrillation   . Hypercholesterolemia   . Spinal stenosis   . Neuromuscular disorder     neuropathy    Past Surgical History  Procedure Date  . Hemorrhoid surgery 2002  . Carpal tunnel release 2005     RIGHT  . Cysto/ bladder bx and fulgeration 02-21-2007  . Right sural nerve bx/ right gastrocemius muscle bx 09-25-2009  . Breast biopsy 1992  . Appendectomy 1961  . Extracorporeal shock wave lithotripsy 08-10-2011    RIGHT  . Transthoracic echocardiogram 11-24-2010    LVEF 35% WITH DIFFUSE HYPOKINESIS, WORSE IN THE BASE/MID ANTERIOR WALL AND LATERAL WALL/ BASAL INFERIOR/ INFEROSEPTAL AKINESIS/  CAVITY SIZE NORMAL/ WALL THICKNESS WAS INCREASED IN A PATTERN OF SEVERE LVH/ PAPAMETERS CONSISTENT WITH AN IRREVERSIBLE RESTRICTIVE PATTERN/  GRADE 4 DIASTOLIC DYSFUNCTION/ MILD TO MOD. MV REGUR./ BIL. ATRIUM MOD. DILATED/ RVSF MOD. REDUCED/ SMALL PERICARDIAL EFFUSION  . Cystoscopy 09/2011  . Tonsillectomy 10-23-11    child  . Cystoscopy with ureteroscopy 10/28/2011    Procedure: CYSTOSCOPY WITH URETEROSCOPY;  Surgeon: Milford Cage, MD;  Location: [facility];  Service: Urology;  Laterality: Right;    ROS:  As stated in the HPI and negative for all other systems.  PHYSICAL EXAM BP 100/55  Pulse 66  Ht 5\' 5"  (1.651 m)  Wt 136 lb (61.689 kg)  BMI 22.63 kg/m2  LMP 08/04/1991 PHYSICAL EXAM GEN:  No distress NECK:  Jugular venous distention at 90 degrees, waveform within normal limits, carotid upstroke brisk and symmetric, no bruits, no thyromegaly LYMPHATICS:  No cervical adenopathy LUNGS:  Clear to auscultation bilaterally BACK:  No CVA tenderness CHEST:  Unremarkable HEART:  S1 and S2 within normal limits, no S3, no S4, no clicks, no rubs, no murmurs ABD:  Positive bowel sounds normal in frequency in pitch, no bruits, no rebound, no guarding, unable to assess midline mass or bruit with the patient seated. EXT:  2 plus pulses throughout, moderate edema, no cyanosis no clubbing SKIN:  No rashes no nodules NEURO:  Cranial nerves II through XII grossly intact, motor grossly intact throughout PSYCH:  Cognitively intact, oriented to person place and time  ASSESSMENT AND PLAN

## 2011-12-17 NOTE — Patient Instructions (Signed)
Your physician recommends that you schedule a follow-up appointment in: 2 MONTHS  TAKE METOPROLOL 25 MG ONCE DAILY AT BEDTIME

## 2011-12-23 LAB — HM MAMMOGRAPHY: HM Mammogram: NORMAL

## 2012-01-06 ENCOUNTER — Ambulatory Visit (INDEPENDENT_AMBULATORY_CARE_PROVIDER_SITE_OTHER): Payer: Medicare Other | Admitting: Pharmacist

## 2012-01-06 DIAGNOSIS — I498 Other specified cardiac arrhythmias: Secondary | ICD-10-CM

## 2012-01-06 DIAGNOSIS — Z7901 Long term (current) use of anticoagulants: Secondary | ICD-10-CM

## 2012-01-06 LAB — POCT INR: INR: 2.8

## 2012-01-07 ENCOUNTER — Ambulatory Visit (INDEPENDENT_AMBULATORY_CARE_PROVIDER_SITE_OTHER): Payer: Medicare Other | Admitting: Internal Medicine

## 2012-01-07 ENCOUNTER — Other Ambulatory Visit: Payer: Self-pay | Admitting: *Deleted

## 2012-01-07 ENCOUNTER — Encounter: Payer: Self-pay | Admitting: Internal Medicine

## 2012-01-07 VITALS — BP 102/62 | HR 63 | Temp 98.5°F | Ht 65.25 in | Wt 133.4 lb

## 2012-01-07 DIAGNOSIS — E538 Deficiency of other specified B group vitamins: Secondary | ICD-10-CM

## 2012-01-07 DIAGNOSIS — E852 Heredofamilial amyloidosis, unspecified: Secondary | ICD-10-CM | POA: Insufficient documentation

## 2012-01-07 DIAGNOSIS — G609 Hereditary and idiopathic neuropathy, unspecified: Secondary | ICD-10-CM

## 2012-01-07 DIAGNOSIS — E8589 Other amyloidosis: Secondary | ICD-10-CM

## 2012-01-07 DIAGNOSIS — G63 Polyneuropathy in diseases classified elsewhere: Secondary | ICD-10-CM

## 2012-01-07 DIAGNOSIS — N058 Unspecified nephritic syndrome with other morphologic changes: Secondary | ICD-10-CM

## 2012-01-07 DIAGNOSIS — G629 Polyneuropathy, unspecified: Secondary | ICD-10-CM

## 2012-01-07 DIAGNOSIS — I429 Cardiomyopathy, unspecified: Secondary | ICD-10-CM

## 2012-01-07 MED ORDER — CYANOCOBALAMIN 1000 MCG/ML IJ SOLN
1000.0000 ug | Freq: Once | INTRAMUSCULAR | Status: AC
  Administered 2012-01-07: 1000 ug via INTRAMUSCULAR

## 2012-01-07 MED ORDER — GABAPENTIN 100 MG PO CAPS: 100.0000 mg | ORAL_CAPSULE | Freq: Three times a day (TID) | ORAL | Status: DC

## 2012-01-07 MED ORDER — ESOMEPRAZOLE MAGNESIUM 40 MG PO CPDR: 40.0000 mg | DELAYED_RELEASE_CAPSULE | Freq: Two times a day (BID) | ORAL | Status: DC

## 2012-01-07 NOTE — Telephone Encounter (Signed)
Received PA need prior- auth on nexium. Called insurance fax over PA form has been sign and faxed bck to insurance. ID # [id number].Marland KitchenMarland KitchenMarland Kitchen5/16/13@4 :01pm/LMB

## 2012-01-07 NOTE — Patient Instructions (Signed)
It was good to see you today. We have reviewed your interval records including labs and tests today B12 shot given today - may return every month for nurse visit to give this injection as needed Medications reviewed, no changes at this time. Refill on medication(s) as discussed today. Please schedule followup in 4-6 months, call sooner if problems.

## 2012-01-07 NOTE — Assessment & Plan Note (Signed)
Dx 2008 - related to amyloidosis Compensated on exam today Follows closely with cards for same (hochrein) The current medical regimen is effective;  continue present plan and medications.

## 2012-01-07 NOTE — Progress Notes (Signed)
Subjective:    Patient ID: Miranda [name], female    DOB: 07-Sep-1940, 71 y.o.   MRN: [id number]  HPI New pt to me and our division of PC but known to our practice, here to establish care  Reviewed chronic medical issues today: familial amyloidosis - dx in 09/2009 after extensive neuro eval locally and [facility] - nerve and muscle bx 09/2009 confirmed amyloid and genetic testin 01/2010 confirmed AD dz - s/p specialty eval at [facility] 03/2010 - using off label diflunisal for same - complicated by CM (since 2008), periph neuropathy with sensorimotor deficits, autonomic dysfx of GI including satiety and "IBS-like" alternating bowels  AF - related to CM - on chronic anticoag with [facility] for same  B12 defic - dx by neuro during workup in 2009 - prior IM shots at PCP/neuro, overdue for replacement now  CM - dx 2008 - denies increase/change in edema, dyspnea on exertion or syncope - no PND - the patient reports compliance with medication(s) as prescribed. Denies adverse side effects. Works with cards regularly on same  Past Medical History  Diagnosis Date  . Lactose intolerance   . IBS (irritable bowel syndrome)   . Dyslipidemia   . Hypertension   . 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 Kidney stone     R ureteral stone  . Coronary artery disease   . S/P hemorrhoidectomy   . Arthritis   . Carotid stenosis, bilateral PER DUPLEX  04-10-2011    RICA  40-59%/  LICA 60-79%  . Cardiomyopathy secondary EF 30%  . Atrial fibrillation   . Hypercholesterolemia   . Spinal stenosis   . Neuromuscular disorder     neuropathy d/t amyloidosis  . ANXIETY   . DEPRESSION    Family History  Problem Relation Age of Onset  . Heart disease Mother   . Arthritis Mother   . Arthritis Father   . COPD Other    History  Substance Use Topics  . Smoking status: Former Smoker -- 20 years    Types: Cigarettes    Quit date:  10/28/1990  . Smokeless tobacco: Never Used  . Alcohol Use: No    Review of Systems Constitutional: Negative for fever or weight change.  Respiratory: Negative for cough and shortness of breath.   Cardiovascular: Negative for chest pain or palpitations.  Gastrointestinal: Negative for abdominal pain, no bowel changes.  Musculoskeletal: Negative for gait problem or joint swelling.  Skin: Negative for rash.  Neurological: Negative for dizziness or headache.  No other specific complaints in a complete review of systems (except as listed in HPI above).     Objective:   Physical Exam BP 102/62  Pulse 63  Temp(Src) 98.5 F (36.9 C) (Oral)  Ht 5' 5.25" (1.657 m)  Wt 133 lb 6.4 oz (60.51 kg)  BMI 22.03 kg/m2  SpO2 97%  LMP 08/04/1991 Wt Readings from Last 3 Encounters:  01/07/12 133 lb 6.4 oz (60.51 kg)  12/17/11 136 lb (61.689 kg)  11/27/11 135 lb (61.236 kg)   Constitutional: She is thin, but appears well-developed and well-nourished. No distress. Uses RW HENT: Head: Normocephalic and atraumatic. Ears: B TMs ok, no erythema or effusion; Nose: Nose normal. Mouth/Throat: Oropharynx is clear and moist. No oropharyngeal exudate.  Eyes: Conjunctivae and EOM are normal. Pupils are equal, round, and reactive to light. No scleral icterus.  Neck: Normal range of motion. Neck supple.  No JVD or LAD present. No thyromegaly present.  Cardiovascular: Irregualr rate and rhythm and normal heart sounds.  No murmur heard. 2+ BLE edema, wearing compression hose. Pulmonary/Chest: Effort normal and breath sounds normal. No respiratory distress. She has no wheezes.  Abdominal: protuberant and firm. Bowel sounds are normal. There is no tenderness. no masses Musculoskeletal: Normal range of motion, no joint effusions. No gross deformities Neurological: She is alert and oriented to person, place, and time. No cranial nerve deficit. Coordination slow but normal.  Skin: Skin is warm and dry. No rash noted.  No erythema.  Psychiatric: She has a normal mood and affect. Her behavior is normal. Judgment and thought content normal.   Lab Results  Component Value Date   WBC 8.7 10/20/2011   HGB 12.3 10/20/2011   HCT 37.3 10/20/2011   PLT 241 10/20/2011   GLUCOSE 108* 11/19/2011   CHOL 169 12/10/2008   TRIG 53.0 12/10/2008   HDL 65.10 12/10/2008   LDLCALC 93 12/10/2008   ALT 13 12/10/2008   AST 20 12/10/2008   NA 138 11/19/2011   K 3.8 11/19/2011   CL 98 11/19/2011   CREATININE 0.9 11/19/2011   BUN 24* 11/19/2011   CO2 32 11/19/2011   INR 2.8 01/06/2012        Assessment & Plan:  See problem list. Medications and labs reviewed today.  Time spent with pt today 45 minutes, greater than 50% time spent counseling patient on familial amyloidosis, The current medical regimen is effective;  continue present plan and medications., B12 deficiency and autonomic/periph neuropathy symptoms mgmt with medication review. Also review of prior records

## 2012-01-07 NOTE — Assessment & Plan Note (Signed)
Ongoing B12 replacement by neuro since 2009 but none in recent months due to change in providers Will resume monthly IM replacement now

## 2012-01-07 NOTE — Assessment & Plan Note (Signed)
Ongoing off label use diflunisal for same HX extensively reviewed today including complications and progression of dz Increase gabapentin for GI symptoms and neuropathy - see next No other tx changes recommended -  To follow up with local neuro and Randell Loop for other tx needs as they arise

## 2012-01-07 NOTE — Assessment & Plan Note (Signed)
Reports gabapentin improves GI dysfx more than alleviating "tingle" in hands/feet Will increase dose as needed for GI symptoms relief - new erx done

## 2012-01-11 NOTE — Telephone Encounter (Signed)
Received Pa back med has been approve. Called pharmacy had to leave msg on vm, also fax over copy for there records... 01/11/12@10 :05am/LMB

## 2012-01-14 ENCOUNTER — Ambulatory Visit: Payer: Medicare Other | Attending: Neurology | Admitting: Physical Therapy

## 2012-01-14 DIAGNOSIS — R269 Unspecified abnormalities of gait and mobility: Secondary | ICD-10-CM | POA: Insufficient documentation

## 2012-01-14 DIAGNOSIS — IMO0001 Reserved for inherently not codable concepts without codable children: Secondary | ICD-10-CM | POA: Insufficient documentation

## 2012-01-14 DIAGNOSIS — M6281 Muscle weakness (generalized): Secondary | ICD-10-CM | POA: Insufficient documentation

## 2012-01-19 ENCOUNTER — Ambulatory Visit: Payer: Medicare Other | Admitting: Internal Medicine

## 2012-01-21 ENCOUNTER — Ambulatory Visit: Payer: Medicare Other | Admitting: Physical Therapy

## 2012-01-25 ENCOUNTER — Ambulatory Visit: Payer: Medicare Other | Attending: Neurology | Admitting: Physical Therapy

## 2012-01-25 DIAGNOSIS — IMO0001 Reserved for inherently not codable concepts without codable children: Secondary | ICD-10-CM | POA: Insufficient documentation

## 2012-01-25 DIAGNOSIS — R269 Unspecified abnormalities of gait and mobility: Secondary | ICD-10-CM | POA: Insufficient documentation

## 2012-01-25 DIAGNOSIS — M6281 Muscle weakness (generalized): Secondary | ICD-10-CM | POA: Insufficient documentation

## 2012-01-26 ENCOUNTER — Encounter: Payer: Medicare Other | Admitting: *Deleted

## 2012-01-29 ENCOUNTER — Encounter: Payer: Medicare Other | Admitting: Physical Therapy

## 2012-02-02 ENCOUNTER — Ambulatory Visit: Payer: Medicare Other | Admitting: *Deleted

## 2012-02-02 ENCOUNTER — Ambulatory Visit: Payer: Medicare Other | Admitting: Physical Therapy

## 2012-02-03 ENCOUNTER — Ambulatory Visit (INDEPENDENT_AMBULATORY_CARE_PROVIDER_SITE_OTHER): Payer: Medicare Other | Admitting: *Deleted

## 2012-02-03 DIAGNOSIS — Z7901 Long term (current) use of anticoagulants: Secondary | ICD-10-CM

## 2012-02-03 DIAGNOSIS — I498 Other specified cardiac arrhythmias: Secondary | ICD-10-CM

## 2012-02-04 ENCOUNTER — Ambulatory Visit: Payer: Medicare Other | Admitting: *Deleted

## 2012-02-04 ENCOUNTER — Ambulatory Visit: Payer: Medicare Other | Admitting: Physical Therapy

## 2012-02-08 ENCOUNTER — Ambulatory Visit (INDEPENDENT_AMBULATORY_CARE_PROVIDER_SITE_OTHER): Payer: Medicare Other | Admitting: *Deleted

## 2012-02-08 DIAGNOSIS — G63 Polyneuropathy in diseases classified elsewhere: Secondary | ICD-10-CM

## 2012-02-08 DIAGNOSIS — E538 Deficiency of other specified B group vitamins: Secondary | ICD-10-CM

## 2012-02-08 MED ORDER — CYANOCOBALAMIN 1000 MCG/ML IJ SOLN
1000.0000 ug | Freq: Once | INTRAMUSCULAR | Status: AC
  Administered 2012-02-08: 1000 ug via INTRAMUSCULAR

## 2012-02-10 ENCOUNTER — Ambulatory Visit: Payer: Medicare Other | Admitting: Physical Therapy

## 2012-02-12 ENCOUNTER — Ambulatory Visit: Payer: Medicare Other | Admitting: Physical Therapy

## 2012-02-12 ENCOUNTER — Encounter: Payer: Self-pay | Admitting: Cardiology

## 2012-02-12 ENCOUNTER — Ambulatory Visit (INDEPENDENT_AMBULATORY_CARE_PROVIDER_SITE_OTHER): Payer: Medicare Other | Admitting: Cardiology

## 2012-02-12 VITALS — BP 92/60 | HR 60 | Ht 65.0 in | Wt 141.8 lb

## 2012-02-12 DIAGNOSIS — I498 Other specified cardiac arrhythmias: Secondary | ICD-10-CM

## 2012-02-12 DIAGNOSIS — I10 Essential (primary) hypertension: Secondary | ICD-10-CM

## 2012-02-12 DIAGNOSIS — I6529 Occlusion and stenosis of unspecified carotid artery: Secondary | ICD-10-CM

## 2012-02-12 DIAGNOSIS — I428 Other cardiomyopathies: Secondary | ICD-10-CM

## 2012-02-12 NOTE — Patient Instructions (Addendum)
Please increase your Lasix to 60 mg for 3 days (1 and 1/2 tablets). Continue all other medications as listed.  Have blood work on Monday.  (BMP)  Please see nurse to have weight taken and reported to Dr Antoine Poche on Monday.  Follow up with Dr Antoine Poche or Tereso Newcomer, PA in 2 weeks.

## 2012-02-12 NOTE — Assessment & Plan Note (Signed)
She had carotid Dopplers in February and had moderate obstruction. These are to be followed up again in August. I will make sure this happens.

## 2012-02-12 NOTE — Assessment & Plan Note (Signed)
Her blood pressure actually runs low. He does not allow up titration of her medications. She will continue on the current regimen. Thankfully she's not having any symptoms related to hypotension or orthostasis.

## 2012-02-12 NOTE — Assessment & Plan Note (Signed)
She's not having any symptoms related to this. She will continue the medications as listed. She's tolerating her anticoagulation. Her

## 2012-02-12 NOTE — Progress Notes (Signed)
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   Since I last saw her she has had no new cardiovascular complaints although her weight is up and she's had some increased lower strandy edema. She's not taken her diuretic for the last couple of days because she's been out of the house quite a bit. She's not describing any new PND or orthopnea. She's not describing any new chest pressure, neck or arm discomfort. She doesn't describe any orthostatic symptoms dizziness or syncope.  Allergies  Allergen Reactions  . Statins Other (See Comments)    LEG CRAMPS     Current Outpatient Prescriptions  Medication Sig Dispense Refill  . Alpha-D-Galactosidase (BEANO PO) Take 1-2 tablets by mouth 2 (two) times daily as needed. GAS       . Calcium Citrate-Vitamin D (CITRACAL + D PO) Take 2 tablets by mouth daily.       . diflunisal (DOLOBID) 500 MG TABS Take 0.5 tablets (250 mg total) by mouth 2 (two) times daily.  60 tablet  6  . docusate sodium (COLACE) 100 MG capsule Take 100 mg by mouth 2 (two) times daily.       Marland Kitchen esomeprazole (NEXIUM) 40 MG capsule Take 1 capsule (40 mg total) by mouth 2 (two) times daily.  60 capsule  5  . ferrous sulfate 325 (65 FE) MG tablet Take 325 mg by mouth daily with breakfast.       . furosemide (LASIX) 20 MG tablet Take 1 tablet (20 mg total) by mouth daily as needed. SWELLING.  PT CAN TAKE UP TO 120 MG. PT DOES HAVE A RX FOR BOTH 40 MG AND 20 MG.  30 tablet  10  . furosemide (LASIX) 40 MG tablet Take 40-120 mg by mouth daily as needed. SWELLING.  PT CAN TAKE UP TO 120 MG. PT DOES HAVE A RX FOR BOTH 40 MG AND 20 MG.        . gabapentin (NEURONTIN) 100 MG capsule Take 1-3 capsules (100-300 mg total) by mouth 3 (three) times daily.  270 capsule  11  . Hypromellose (GENTEAL) 0.3 % SOLN Place 1 drop into both eyes 2 (two) times daily as needed. DRY EYES       . loperamide (IMODIUM) 2 MG capsule Take 2 mg by mouth 2 (two) times daily.       . metoprolol  succinate (TOPROL-XL) 25 MG 24 hr tablet Take 1 tablet (25 mg total) by mouth daily.  30 tablet  11  . Multiple Vitamins-Minerals (MULTIVITAMINS THER. W/MINERALS) TABS Take 1 tablet by mouth daily.       . Nutritional Supplements (CARNATION INSTANT BREAKFAST PO) Take 1 Package by mouth 2 (two) times daily.        . potassium chloride (K-DUR) 10 MEQ tablet Take 10-30 mEq by mouth daily as needed. FLUID       . Probiotic Product (ALIGN PO) Take 1 capsule by mouth daily.       . Simethicone 180 MG CAPS Take 1-2 capsules by mouth 2 (two) times daily as needed. GAS       . warfarin (COUMADIN) 2.5 MG tablet Take 2.5 mg by mouth. 2.5mg  qd except Saturdays, takes two tablets then.        Past Medical History  Diagnosis Date  . Lactose intolerance   . IBS (irritable bowel syndrome)   . Dyslipidemia   . Hypertension   . 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 Kidney stone     R ureteral stone  . Coronary artery disease   . S/P hemorrhoidectomy   . Arthritis   . Carotid stenosis, bilateral PER DUPLEX  04-10-2011    RICA  40-59%/  LICA 60-79%  . Cardiomyopathy secondary EF 30%  . Atrial fibrillation   . Hypercholesterolemia   . Spinal stenosis   . Neuromuscular disorder     neuropathy d/t amyloidosis  . ANXIETY   . DEPRESSION     Past Surgical History  Procedure Date  . Hemorrhoid surgery 2002  . Carpal tunnel release 2005    RIGHT  . Cysto/ bladder bx and fulgeration 02-21-2007  . Right sural nerve bx/ right gastrocemius muscle bx 09-25-2009  . Breast biopsy 1992  . Appendectomy 1961  . Extracorporeal shock wave lithotripsy 08-10-2011    RIGHT  . Transthoracic echocardiogram 11-24-2010    LVEF 35% WITH DIFFUSE HYPOKINESIS, WORSE IN THE BASE/MID ANTERIOR WALL AND LATERAL WALL/ BASAL INFERIOR/ INFEROSEPTAL AKINESIS/  CAVITY SIZE NORMAL/ WALL THICKNESS WAS INCREASED IN A PATTERN OF SEVERE LVH/ PAPAMETERS  CONSISTENT WITH AN IRREVERSIBLE RESTRICTIVE PATTERN/  GRADE 4 DIASTOLIC DYSFUNCTION/ MILD TO MOD. MV REGUR./ BIL. ATRIUM MOD. DILATED/ RVSF MOD. REDUCED/ SMALL PERICARDIAL EFFUSION  . Cystoscopy 09/2011  . Tonsillectomy 10-23-11    child  . Cystoscopy with ureteroscopy 10/28/2011    Procedure: CYSTOSCOPY WITH URETEROSCOPY;  Surgeon: Milford Cage, MD;  Location: [facility];  Service: Urology;  Laterality: Right;    ROS:  As stated in the HPI and negative for all other systems.  PHYSICAL EXAM BP 92/60  Pulse 60  Ht 5\' 5"  (1.651 m)  Wt 141 lb 12.8 oz (64.32 kg)  BMI 23.60 kg/m2  LMP 08/04/1991 PHYSICAL EXAM GEN:  No distress, frail appearing NECK:  Jugular venous distention at 90 degrees, waveform within normal limits, carotid upstroke brisk and symmetric, no bruits, no thyromegaly LYMPHATICS:  No cervical adenopathy LUNGS:  Clear to auscultation bilaterally BACK:  No CVA tenderness CHEST:  Unremarkable HEART:  S1 and S2 within normal limits, no S3, no S4, no clicks, no rubs, no murmurs ABD:  Positive bowel sounds normal in frequency in pitch, no bruits, no rebound, no guarding, unable to assess midline mass or bruit with the patient seated. EXT:  2 plus pulses throughout, moderate edema increased from previous, no cyanosis no clubbing NEURO:  Diffuse weakness and muscle wasting nonfocal otherwise  ASSESSMENT AND PLAN

## 2012-02-12 NOTE — Assessment & Plan Note (Signed)
Her volume is up and weight is up. She'll take 60 mg of Lasix for the next 3 days. She will come back for a weight and a basic metabolic profile on Monday.

## 2012-02-14 ENCOUNTER — Other Ambulatory Visit: Payer: Self-pay | Admitting: Cardiology

## 2012-02-15 ENCOUNTER — Telehealth: Payer: Self-pay | Admitting: *Deleted

## 2012-02-15 ENCOUNTER — Encounter: Payer: Medicare Other | Admitting: *Deleted

## 2012-02-15 ENCOUNTER — Other Ambulatory Visit (INDEPENDENT_AMBULATORY_CARE_PROVIDER_SITE_OTHER): Payer: Medicare Other

## 2012-02-15 ENCOUNTER — Ambulatory Visit: Payer: Medicare Other | Admitting: Physical Therapy

## 2012-02-15 DIAGNOSIS — I428 Other cardiomyopathies: Secondary | ICD-10-CM

## 2012-02-15 LAB — BASIC METABOLIC PANEL
BUN: 28 mg/dL — ABNORMAL HIGH (ref 6–23)
Chloride: 98 mEq/L (ref 96–112)
GFR: 53.5 mL/min — ABNORMAL LOW (ref >60.00)
Potassium: 4.1 mEq/L (ref 3.5–5.1)
Sodium: 138 mEq/L (ref 135–145)

## 2012-02-15 NOTE — Telephone Encounter (Signed)
..   Requested Prescriptions   Pending Prescriptions Disp Refills  . furosemide (LASIX) 40 MG tablet [Pharmacy Med Name: FUROSEMIDE 40 MG TAB RANB] 90 tablet 10    Sig: TAKE TWO (2) TO THREE (3) TABLET(S) ONCE DAILY AS NEEDED

## 2012-02-15 NOTE — Telephone Encounter (Signed)
Patient in for Blood work Designer, [employer]ellery) and have weigh  taken. Patient had Lasix dose increase to 60 mg for 3 days started on Friday  02/12/12, her weight was 141 lbs and 12 oz, today's weight is 135 lbs and 12 oz. Patient has no C/O at this time.

## 2012-02-15 NOTE — Progress Notes (Signed)
Entered in error Debbie Anagha Loseke RN  

## 2012-02-18 ENCOUNTER — Ambulatory Visit: Payer: Medicare Other | Admitting: Physical Therapy

## 2012-02-22 ENCOUNTER — Ambulatory Visit: Payer: Medicare Other | Attending: Neurology | Admitting: Physical Therapy

## 2012-02-22 DIAGNOSIS — R269 Unspecified abnormalities of gait and mobility: Secondary | ICD-10-CM | POA: Insufficient documentation

## 2012-02-22 DIAGNOSIS — IMO0001 Reserved for inherently not codable concepts without codable children: Secondary | ICD-10-CM | POA: Insufficient documentation

## 2012-02-22 DIAGNOSIS — M6281 Muscle weakness (generalized): Secondary | ICD-10-CM | POA: Insufficient documentation

## 2012-02-23 ENCOUNTER — Ambulatory Visit (INDEPENDENT_AMBULATORY_CARE_PROVIDER_SITE_OTHER): Payer: Medicare Other | Admitting: Physician Assistant

## 2012-02-23 ENCOUNTER — Ambulatory Visit: Payer: Medicare Other | Admitting: Physical Therapy

## 2012-02-23 ENCOUNTER — Ambulatory Visit (INDEPENDENT_AMBULATORY_CARE_PROVIDER_SITE_OTHER): Payer: Medicare Other | Admitting: *Deleted

## 2012-02-23 ENCOUNTER — Encounter: Payer: Self-pay | Admitting: Physician Assistant

## 2012-02-23 VITALS — BP 88/60 | HR 77 | Ht 65.0 in | Wt 135.0 lb

## 2012-02-23 DIAGNOSIS — I498 Other specified cardiac arrhythmias: Secondary | ICD-10-CM

## 2012-02-23 DIAGNOSIS — Z7901 Long term (current) use of anticoagulants: Secondary | ICD-10-CM

## 2012-02-23 DIAGNOSIS — I2510 Atherosclerotic heart disease of native coronary artery without angina pectoris: Secondary | ICD-10-CM

## 2012-02-23 DIAGNOSIS — I5042 Chronic combined systolic (congestive) and diastolic (congestive) heart failure: Secondary | ICD-10-CM

## 2012-02-23 DIAGNOSIS — I429 Cardiomyopathy, unspecified: Secondary | ICD-10-CM

## 2012-02-23 LAB — BASIC METABOLIC PANEL
BUN: 31 mg/dL — ABNORMAL HIGH (ref 6–23)
Calcium: 9 mg/dL (ref 8.4–10.5)
Chloride: 99 mEq/L (ref 96–112)
Creatinine, Ser: 1 mg/dL (ref 0.4–1.2)
GFR: 55.29 mL/min — ABNORMAL LOW (ref >60.00)

## 2012-02-23 LAB — POCT INR: INR: 3.7

## 2012-02-23 NOTE — Progress Notes (Signed)
[address] [place], [place]  [address] Phone: [phone] Fax:  [phone]  Date:  02/23/2012   Name:  Miranda [name]   DOB:  1940-09-17   MRN:  [id number]  PCP:  Rene Paci, MD  Primary Cardiologist:  Dr. Rollene Rotunda  Primary Electrophysiologist:  None    History of Present Illness: Miranda [name] is a 71 y.o. female who returns for follow up on CHF.  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   LHC in [place] in 2008: OM 40%, oRI 95% (small), pRCA 40%, mRCA 25%.  Echo in 09/2011:  Severe LVH, EF 20-25%, grade 2 diast dysfxn, mild MR, mod LAE, mild RVE, mild to mod reduced RV function, mild RAE, PASP 35.  She is not a candidate for transplant.  She saw Dr. Antoine Poche in follow up 6/21.  Her weight was up and she was felt to be volume overloaded.  She was placed on increased dose of Lasix for several days with plans for follow up today.  Her weight is down 6 pounds.  She notes improvement in her edema.  She denies significant changes in her shortness of breath.  She denies chest pain.  She denies syncope.  She denies orthopnea or PND.  Her blood pressure is running low.  She seems to be tolerating this okay.  She denies any significant dizziness.  She does get lightheaded at times.  Wt Readings from Last 3 Encounters:  02/23/12 135 lb (61.236 kg)  02/12/12 141 lb 12.8 oz (64.32 kg)  01/07/12 133 lb 6.4 oz (60.51 kg)     Potassium  Date/Time Value Range Status  02/15/2012 11:11 AM 4.1  3.5 - 5.1 mEq/L Final     Creatinine, Ser  Date/Time Value Range Status  02/15/2012 11:11 AM 1.1  0.4 - 1.2 mg/dL Final    Past Medical History  Diagnosis Date  . Lactose intolerance   . IBS (irritable bowel syndrome)   . Dyslipidemia   . Hypertension   . 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 Kidney stone     R ureteral stone  .  Coronary artery disease   . S/P hemorrhoidectomy   . Arthritis   . Carotid stenosis, bilateral PER DUPLEX  04-10-2011    RICA  40-59%/  LICA 60-79%  . Cardiomyopathy secondary EF 30%  . Atrial fibrillation   . Hypercholesterolemia   . Spinal stenosis   . Neuromuscular disorder     neuropathy d/t amyloidosis  . ANXIETY   . DEPRESSION     Current Outpatient Prescriptions  Medication Sig Dispense Refill  . Alpha-D-Galactosidase (BEANO PO) Take 1-2 tablets by mouth 2 (two) times daily as needed. GAS       . Calcium Citrate-Vitamin D (CITRACAL + D PO) Take 2 tablets by mouth daily.       . diflunisal (DOLOBID) 500 MG TABS Take 0.5 tablets (250 mg total) by mouth 2 (two) times daily.  60 tablet  6  . docusate sodium (COLACE) 100 MG capsule Take 100 mg by mouth 2 (two) times daily.       Marland Kitchen esomeprazole (NEXIUM) 40 MG capsule Take 1 capsule (40 mg total) by mouth 2 (two) times daily.  60 capsule  5  . ferrous sulfate 325 (65 FE) MG tablet Take 325 mg by mouth daily with  breakfast.       . furosemide (LASIX) 20 MG tablet Take 1 tablet (20 mg total) by mouth daily as needed. SWELLING.  PT CAN TAKE UP TO 120 MG. PT DOES HAVE A RX FOR BOTH 40 MG AND 20 MG.  30 tablet  10  . furosemide (LASIX) 40 MG tablet TAKE TWO (2) TO THREE (3) TABLET(S) ONCE DAILY AS NEEDED  90 tablet  10  . gabapentin (NEURONTIN) 100 MG capsule Take 1-3 capsules (100-300 mg total) by mouth 3 (three) times daily.  270 capsule  11  . Hypromellose (GENTEAL) 0.3 % SOLN Place 1 drop into both eyes 2 (two) times daily as needed. DRY EYES       . loperamide (IMODIUM) 2 MG capsule Take 2 mg by mouth 2 (two) times daily.       . metoprolol succinate (TOPROL-XL) 25 MG 24 hr tablet Take 1 tablet (25 mg total) by mouth daily.  30 tablet  11  . Multiple Vitamins-Minerals (MULTIVITAMINS THER. W/MINERALS) TABS Take 1 tablet by mouth daily.       . Nutritional Supplements (CARNATION INSTANT BREAKFAST PO) Take 1 Package by mouth 2 (two)  times daily.        . potassium chloride (K-DUR) 10 MEQ tablet Take 10-30 mEq by mouth daily as needed. FLUID       . Probiotic Product (ALIGN PO) Take 1 capsule by mouth daily.       . Simethicone 180 MG CAPS Take 1-2 capsules by mouth 2 (two) times daily as needed. GAS       . warfarin (COUMADIN) 2.5 MG tablet Take 2.5 mg by mouth. 2.5mg  qd except Saturdays, takes two tablets then.        Allergies: Allergies  Allergen Reactions  . Statins Other (See Comments)    LEG CRAMPS     History  Substance Use Topics  . Smoking status: Former Smoker -- 20 years    Types: Cigarettes    Quit date: 10/28/1990  . Smokeless tobacco: Never Used  . Alcohol Use: No     ROS:  Please see the history of present illness.   She denies fevers, chills, cough, melena, hematochezia, hematuria.  All other systems reviewed and negative.   PHYSICAL EXAM: VS:  BP 88/60  Pulse 77  Ht 5\' 5"  (1.651 m)  Wt 135 lb (61.236 kg)  BMI 22.47 kg/m2  LMP 08/04/1991 Well nourished, well developed, in no acute distress HEENT: normal Neck: no JVD At 90 degrees Cardiac:  normal S1, S2; Irregularly irregular; 1/6 systolic murmur Along the left sternal border Lungs:  Decreased breath sounds bilaterally, no wheezing, rhonchi or rales Abd: soft, nontender, no hepatomegaly Ext: 1-2+ bilateral LE edema; Bilateral compression stockings in place Skin: warm and dry Neuro:  CNs 2-12 intact, no focal abnormalities noted  EKG:  Atrial fibrillation, heart rate 77, left bundle branch block, no change from prior tracing      ASSESSMENT AND PLAN:  1.  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  Her volume seems to be improved.  She typically takes about 60 mg of Lasix per day.  She occasionally takes 80 mg.  This seems to be keeping her fairly stable.  I have recommended that she continue this regimen.  Check a basic metabolic panel today.  I reviewed her case today  with Dr. Antoine Poche, who agreed.  Follow up in 6 weeks.  2.  Atrial  fibrillation She remains on Coumadin.  Rate is controlled.  3.  Hypotension She seems to tolerate this.  Signed, Tereso Newcomer, PA-C  9:16 AM 02/23/2012

## 2012-02-23 NOTE — Patient Instructions (Addendum)
Your physician recommends that you schedule a follow-up appointment in: 6 WEEKS WITH DR. HOCHREIN PER DR. HOCHREIN  CONTINUE WITH YOUR LASIX THE WAY YOU HAVE BEEN TAKING IT; 60 MG DAILY WITH EXTRA 20 MG IF NEEDED FOR EXTRA SWELLING OR WEIGHT INCREASE OR SHORTNESS OF BREATH   Your physician recommends that you return for lab work in: TODAY BMET

## 2012-02-23 NOTE — Addendum Note (Signed)
Addended by: Tarri Fuller on: 02/23/2012 09:49 AM   Modules accepted: Orders

## 2012-02-29 ENCOUNTER — Ambulatory Visit: Payer: Medicare Other | Admitting: Occupational Therapy

## 2012-02-29 ENCOUNTER — Ambulatory Visit: Payer: Medicare Other | Admitting: Physical Therapy

## 2012-03-03 ENCOUNTER — Ambulatory Visit: Payer: Medicare Other | Admitting: Physical Therapy

## 2012-03-03 ENCOUNTER — Ambulatory Visit: Payer: Medicare Other | Admitting: Occupational Therapy

## 2012-03-08 ENCOUNTER — Ambulatory Visit: Payer: Medicare Other | Admitting: Occupational Therapy

## 2012-03-09 NOTE — Progress Notes (Signed)
This encounter was created in error - please disregard.  This encounter was created in error - please disregard.

## 2012-03-10 ENCOUNTER — Ambulatory Visit: Payer: Medicare Other | Admitting: Occupational Therapy

## 2012-03-10 ENCOUNTER — Ambulatory Visit (INDEPENDENT_AMBULATORY_CARE_PROVIDER_SITE_OTHER): Payer: Medicare Other

## 2012-03-10 DIAGNOSIS — E538 Deficiency of other specified B group vitamins: Secondary | ICD-10-CM

## 2012-03-10 DIAGNOSIS — G63 Polyneuropathy in diseases classified elsewhere: Secondary | ICD-10-CM

## 2012-03-10 MED ORDER — CYANOCOBALAMIN 1000 MCG/ML IJ SOLN
1000.0000 ug | Freq: Once | INTRAMUSCULAR | Status: AC
  Administered 2012-03-10: 1000 ug via INTRAMUSCULAR

## 2012-03-11 ENCOUNTER — Ambulatory Visit (INDEPENDENT_AMBULATORY_CARE_PROVIDER_SITE_OTHER): Payer: Medicare Other | Admitting: *Deleted

## 2012-03-11 DIAGNOSIS — Z7901 Long term (current) use of anticoagulants: Secondary | ICD-10-CM

## 2012-03-11 DIAGNOSIS — I498 Other specified cardiac arrhythmias: Secondary | ICD-10-CM

## 2012-03-11 LAB — POCT INR: INR: 3

## 2012-03-15 ENCOUNTER — Ambulatory Visit: Payer: Medicare Other | Admitting: Physical Therapy

## 2012-03-17 ENCOUNTER — Encounter: Payer: Medicare Other | Admitting: Occupational Therapy

## 2012-03-22 ENCOUNTER — Encounter: Payer: Medicare Other | Admitting: Occupational Therapy

## 2012-03-22 ENCOUNTER — Ambulatory Visit: Payer: Medicare Other | Admitting: Physical Therapy

## 2012-03-23 ENCOUNTER — Ambulatory Visit: Payer: Medicare Other | Admitting: Occupational Therapy

## 2012-03-24 ENCOUNTER — Ambulatory Visit: Payer: Medicare Other | Admitting: Physical Therapy

## 2012-03-25 ENCOUNTER — Ambulatory Visit: Payer: Medicare Other | Admitting: Neurology

## 2012-03-29 ENCOUNTER — Ambulatory Visit: Payer: Medicare Other | Admitting: Physical Therapy

## 2012-03-29 ENCOUNTER — Encounter: Payer: Medicare Other | Admitting: Occupational Therapy

## 2012-03-29 ENCOUNTER — Telehealth: Payer: Self-pay | Admitting: Cardiology

## 2012-03-29 NOTE — Telephone Encounter (Signed)
Pt is still having trouble with edema and her b/p is low and right now it is 82/44 pulse 88. It seems to be low at bedtime so she has not been taking her metoprolol and she needs to know if she should stop taking at night or what

## 2012-03-29 NOTE — Telephone Encounter (Signed)
Pt calling because she has been having an increase in fatigue, feeling worn out and "draggy".  She is also having some dizziness with walking.  She reports her BP is running from 79/48 to 86/54 in the evenings before bed.  For the last 3 nights she reports because her blood pressure has been so low she has not taken her Metoprolol.  Will review with MD and call her back with recommendations/orders.

## 2012-03-29 NOTE — Telephone Encounter (Signed)
Left message for pt to call back  °

## 2012-03-30 NOTE — Telephone Encounter (Signed)
Tell her to stop her Metoprolol for now and to call us early next week to let us know how she is feeling.

## 2012-03-30 NOTE — Telephone Encounter (Signed)
Pt aware to stop Metoprolol. She will call back next week.

## 2012-03-31 ENCOUNTER — Other Ambulatory Visit: Payer: Self-pay | Admitting: *Deleted

## 2012-03-31 MED ORDER — WARFARIN SODIUM 2.5 MG PO TABS: ORAL_TABLET | ORAL | Status: DC

## 2012-04-01 ENCOUNTER — Telehealth: Payer: Self-pay | Admitting: Cardiology

## 2012-04-01 NOTE — Telephone Encounter (Signed)
Pt came into the office today with complaints of not feeling like she can breath well - nasal congestion that is non-productive.  She reports only being able to mouth breath and it is making her throat sore.  BP 89/53 HR 84 02 sat 97-98 while sitting and talking.  Lung sounds were clear bilaterally.  Her wt was 147 lbs which is up for her.  She has bilaterally pitting edema.  She admits to eating waffle fries today that she put salt on and stated she was just going to have to not add salt to her food.  She has a poor appetite generally and doesn't eat much therefore is feels like if there is something that she wants she should be able to eat it.  Her last wt here on 7/2 was 135 lbs.  This is a 12 lb increase.  She is going to increase her Lasix and potassium for 3 days as instructed previously. She will have BMP on Monday when she comes for her PT/INR.  She will use plain saline nasal spray or warm distilled water to wash out her sinuses.  She will call the MD on call or report to the ED if symptoms worsen.  Pt is in agreement.

## 2012-04-01 NOTE — Telephone Encounter (Signed)
PT HAVING LOW BP , AND FEELS CONGESTED, FELT DIZZY A FEW MINUTES AGO 70/44, DRANK SOME WATER THEN TOOK IT AND NOW  70/35, PLS CALL [phone]

## 2012-04-01 NOTE — Telephone Encounter (Signed)
Spoke with pt who is having trouble with low blood pressure and dizziness.  She doesn't feel like she can breath well.  She will report to the office for eval.

## 2012-04-04 ENCOUNTER — Ambulatory Visit (INDEPENDENT_AMBULATORY_CARE_PROVIDER_SITE_OTHER): Payer: Medicare Other | Admitting: Pharmacist

## 2012-04-04 ENCOUNTER — Other Ambulatory Visit (INDEPENDENT_AMBULATORY_CARE_PROVIDER_SITE_OTHER): Payer: Medicare Other

## 2012-04-04 ENCOUNTER — Other Ambulatory Visit: Payer: Self-pay

## 2012-04-04 DIAGNOSIS — I10 Essential (primary) hypertension: Secondary | ICD-10-CM

## 2012-04-04 DIAGNOSIS — I498 Other specified cardiac arrhythmias: Secondary | ICD-10-CM

## 2012-04-04 DIAGNOSIS — Z7901 Long term (current) use of anticoagulants: Secondary | ICD-10-CM

## 2012-04-04 LAB — BASIC METABOLIC PANEL
BUN: 28 mg/dL — ABNORMAL HIGH (ref 6–23)
Calcium: 8.5 mg/dL (ref 8.4–10.5)
Creatinine, Ser: 0.9 mg/dL (ref 0.4–1.2)
GFR: 63.67 mL/min (ref >60.00)

## 2012-04-04 LAB — POCT INR: INR: 2.5

## 2012-04-05 ENCOUNTER — Encounter: Payer: Medicare Other | Admitting: Occupational Therapy

## 2012-04-07 ENCOUNTER — Ambulatory Visit (INDEPENDENT_AMBULATORY_CARE_PROVIDER_SITE_OTHER): Payer: Medicare Other | Admitting: Neurology

## 2012-04-07 ENCOUNTER — Ambulatory Visit: Payer: Medicare Other | Attending: Neurology | Admitting: Occupational Therapy

## 2012-04-07 ENCOUNTER — Encounter: Payer: Self-pay | Admitting: Neurology

## 2012-04-07 ENCOUNTER — Other Ambulatory Visit: Payer: Self-pay | Admitting: Neurology

## 2012-04-07 VITALS — BP 90/62 | HR 96 | Wt 141.0 lb

## 2012-04-07 DIAGNOSIS — R269 Unspecified abnormalities of gait and mobility: Secondary | ICD-10-CM

## 2012-04-07 DIAGNOSIS — E852 Heredofamilial amyloidosis, unspecified: Secondary | ICD-10-CM

## 2012-04-07 DIAGNOSIS — IMO0001 Reserved for inherently not codable concepts without codable children: Secondary | ICD-10-CM | POA: Insufficient documentation

## 2012-04-07 DIAGNOSIS — K589 Irritable bowel syndrome without diarrhea: Secondary | ICD-10-CM

## 2012-04-07 DIAGNOSIS — M6281 Muscle weakness (generalized): Secondary | ICD-10-CM | POA: Insufficient documentation

## 2012-04-07 MED ORDER — DULOXETINE HCL 30 MG PO CPEP: ORAL_CAPSULE | ORAL | Status: DC

## 2012-04-07 NOTE — Progress Notes (Signed)
Dear Dr. 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  She of course has multi-system involvement - this includes a sensorimotor neuropathy, autonomic neuropathy, cardiomyopathy, and gastrointestinal system.  She has been seen in the past at [facility] at its amyloid program and they recommended diflunisal which is being used off-label and is in trials currently for familial amyloid.  She notes some progression in her symptoms having increasing difficulty walking.  She still gets parasthesias as well as infrequent shooting pains in there feet and hands.  She uses the gabapentin for easing GI discomfort as well as this seems to help presumably by acting on the enteric nervous system.  She uses the rolling walker all the time.  She has considered getting a scooter but her house will make this difficult.  [place] so she does not have to go up stairs.  She is sleeping in a recliner because of GERD.  She continues to have a large problem with swelling of the legs which make it very difficult to mobilize her legs.  She has been getting physical therapy which has helped and she would like to see if she can return to see her old physical therapist.  She continues to have problems with infrequent bowel incontinence.  She takes Imodium prophylactically.   Medical history, social history, and family history were reviewed and have not changed since the last clinic visit.  Current Outpatient Prescriptions on File Prior to Visit  Medication Sig Dispense Refill  . Alpha-D-Galactosidase (BEANO PO) Take 1-2 tablets by mouth 2 (two) times daily as needed. GAS       . Calcium Citrate-Vitamin D (CITRACAL + D PO) Take 2 tablets by mouth daily.       . diflunisal (DOLOBID) 500 MG TABS Take 0.5 tablets (250 mg total) by mouth 2 (two) times daily.  60 tablet  6  . docusate sodium (COLACE) 100 MG capsule Take 100  mg by mouth 2 (two) times daily.       Marland Kitchen esomeprazole (NEXIUM) 40 MG capsule Take 1 capsule (40 mg total) by mouth 2 (two) times daily.  60 capsule  5  . ferrous sulfate 325 (65 FE) MG tablet Take 325 mg by mouth daily with breakfast.       . furosemide (LASIX) 20 MG tablet Take 1 tablet (20 mg total) by mouth daily as needed. SWELLING.  PT CAN TAKE UP TO 120 MG. PT DOES HAVE A RX FOR BOTH 40 MG AND 20 MG.  30 tablet  10  . furosemide (LASIX) 40 MG tablet TAKE TWO (2) TO THREE (3) TABLET(S) ONCE DAILY AS NEEDED  90 tablet  10  . gabapentin (NEURONTIN) 100 MG capsule Take 1-3 capsules (100-300 mg total) by mouth 3 (three) times daily.  270 capsule  11  . Hypromellose (GENTEAL) 0.3 % SOLN Place 1 drop into both eyes 2 (two) times daily as needed. DRY EYES       . loperamide (IMODIUM) 2 MG capsule Take 2 mg by mouth 2 (two) times daily.       . metoprolol succinate (TOPROL-XL) 25 MG 24 hr tablet Take 1 tablet (25 mg total) by mouth daily.  30 tablet  11  . Multiple Vitamins-Minerals (MULTIVITAMINS THER. W/MINERALS) TABS Take 1 tablet by mouth daily.       . Nutritional Supplements (CARNATION INSTANT BREAKFAST PO) Take 1 Package  by mouth 2 (two) times daily.        . potassium chloride (K-DUR) 10 MEQ tablet Take 10-30 mEq by mouth daily as needed. FLUID       . Probiotic Product (ALIGN PO) Take 1 capsule by mouth daily.       . Simethicone 180 MG CAPS Take 1-2 capsules by mouth 2 (two) times daily as needed. GAS       . warfarin (COUMADIN) 2.5 MG tablet Take as directed by coumadin clinic.  40 tablet  3  . DULoxetine (CYMBALTA) 30 MG capsule take 1 in the a.m. for two weeks, then increase to 2 in the a.m. from then on.  60 capsule  11    Allergies  Allergen Reactions  . Statins Other (See Comments)    LEG CRAMPS     ROS:  13 systems were reviewed and are notable for the items mentioned above.  She denies any depression.  All other review of systems are unremarkable.  Exam: . Filed Vitals:    04/07/12 1346  BP: 90/62  Pulse: 96  Weight: 141 lb (63.957 kg)    In general, tired appearing women.  Mental status:   The patient is oriented to person, place and time. Recent and remote memory are intact. Attention span and concentration are normal. Language including repetition, naming, following commands are intact. Fund of knowledge of current and historical events, as well as vocabulary are normal.  Cranial Nerves: Eye movements full.  No facial weakness.  T/U/P midline.  Motor:  5/5 UE, except wasting in the intrinsic muscle of the hands and weakness.  In LE 4/5 hip flexor but other wise full strength except for 2/5 FDF.  Reflexes:  No reflexes.  Gait:  With rolling walker, bilateral foot drop. Impression/Recommendations:  1.  FAP - Given the burning shooting pains, I suggested the use of Cymbalta in adjunct to gabapentin.  She will give this a try.  I am also curious to see how it helps with her GI complaints.  She will continue the diflunisal.   2.  GI complaints - I am referring her to the GI Neuromuscular program at [facility] to see if they have any suggestions.  The patient will likely see me at [facility].  Lupita Raider Modesto Charon, MD [facility], [facility]

## 2012-04-08 ENCOUNTER — Ambulatory Visit: Payer: Medicare Other

## 2012-04-11 ENCOUNTER — Encounter: Payer: Self-pay | Admitting: Cardiology

## 2012-04-11 ENCOUNTER — Ambulatory Visit (INDEPENDENT_AMBULATORY_CARE_PROVIDER_SITE_OTHER): Payer: Medicare Other | Admitting: Cardiology

## 2012-04-11 ENCOUNTER — Other Ambulatory Visit (INDEPENDENT_AMBULATORY_CARE_PROVIDER_SITE_OTHER): Payer: Medicare Other

## 2012-04-11 ENCOUNTER — Ambulatory Visit: Payer: Medicare Other

## 2012-04-11 ENCOUNTER — Ambulatory Visit: Payer: Medicare Other | Admitting: Physical Therapy

## 2012-04-11 VITALS — BP 84/56 | HR 100 | Ht 65.0 in | Wt 140.8 lb

## 2012-04-11 DIAGNOSIS — I428 Other cardiomyopathies: Secondary | ICD-10-CM

## 2012-04-11 DIAGNOSIS — I429 Cardiomyopathy, unspecified: Secondary | ICD-10-CM

## 2012-04-11 LAB — BASIC METABOLIC PANEL
BUN: 29 mg/dL — ABNORMAL HIGH (ref 6–23)
Chloride: 97 mEq/L (ref 96–112)
GFR: 57.16 mL/min — ABNORMAL LOW (ref >60.00)
Glucose, Bld: 76 mg/dL (ref 70–99)
Potassium: 3.4 mEq/L — ABNORMAL LOW (ref 3.5–5.1)
Sodium: 139 mEq/L (ref 135–145)

## 2012-04-11 MED ORDER — TORSEMIDE 20 MG PO TABS: 20.0000 mg | ORAL_TABLET | Freq: Two times a day (BID) | ORAL | Status: DC

## 2012-04-11 NOTE — Progress Notes (Signed)
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   Since I last saw her she has continued to have problems with edema below her knees. She's been taking increased dose of diuretic about twice a day of Lasix. She tries to keep her feet up but is limited in keeping them about her heart because she has reflux. She is hypotensive and has been taken off of her beta blocker. However, she's not describing any orthostasis. She's not had any presyncope or syncope. She denies any chest pressure, neck or arm discomfort. She's not describing palpitations. She actually doesn't have PND or orthopnea. We have followed her renal function closely. She was slightly hypokalemic at the last lab draw and this was supplemented. She's been taking 30 mEq of potassium daily  Allergies  Allergen Reactions  . Statins Other (See Comments)    LEG CRAMPS     Current Outpatient Prescriptions  Medication Sig Dispense Refill  . Alpha-D-Galactosidase (BEANO PO) Take 1-2 tablets by mouth 2 (two) times daily as needed. GAS       . Calcium Citrate-Vitamin D (CITRACAL + D PO) Take 2 tablets by mouth daily.       . diflunisal (DOLOBID) 500 MG TABS Take 0.5 tablets (250 mg total) by mouth 2 (two) times daily.  60 tablet  6  . docusate sodium (COLACE) 100 MG capsule Take 100 mg by mouth 2 (two) times daily.       . DULoxetine (CYMBALTA) 30 MG capsule take 1 in the a.m. for two weeks, then increase to 2 in the a.m. from then on.  60 capsule  11  . esomeprazole (NEXIUM) 40 MG capsule Take 1 capsule (40 mg total) by mouth 2 (two) times daily.  60 capsule  5  . ferrous sulfate 325 (65 FE) MG tablet Take 325 mg by mouth daily with breakfast.       . furosemide (LASIX) 20 MG tablet Take 1 tablet (20 mg total) by mouth daily as needed. SWELLING.  PT CAN TAKE UP TO 120 MG. PT DOES HAVE A RX FOR BOTH 40 MG AND 20 MG.  30 tablet  10  . furosemide (LASIX) 40 MG tablet TAKE TWO (2) TO THREE (3) TABLET(S) ONCE DAILY AS NEEDED   90 tablet  10  . gabapentin (NEURONTIN) 100 MG capsule Take 1-3 capsules (100-300 mg total) by mouth 3 (three) times daily.  270 capsule  11  . Hypromellose (GENTEAL) 0.3 % SOLN Place 1 drop into both eyes 2 (two) times daily as needed. DRY EYES       . loperamide (IMODIUM) 2 MG capsule Take 2 mg by mouth 2 (two) times daily.       . metoprolol succinate (TOPROL-XL) 25 MG 24 hr tablet Take 1 tablet (25 mg total) by mouth daily.  30 tablet  11  . Multiple Vitamins-Minerals (MULTIVITAMINS THER. W/MINERALS) TABS Take 1 tablet by mouth daily.       . Nutritional Supplements (CARNATION INSTANT BREAKFAST PO) Take 1 Package by mouth 2 (two) times daily.        . potassium chloride (K-DUR) 10 MEQ tablet Take 10-30 mEq by mouth daily as needed. FLUID       . Probiotic Product (ALIGN PO) Take 1 capsule by mouth daily.       . Simethicone 180 MG CAPS Take 1-2 capsules by mouth 2 (two) times daily as needed. GAS       . warfarin (COUMADIN)  2.5 MG tablet Take as directed by coumadin clinic.  40 tablet  3    Past Medical History  Diagnosis Date  . Lactose intolerance   . IBS (irritable bowel syndrome)   . Dyslipidemia   . Hypertension   . 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 Kidney stone     R ureteral stone  . Coronary artery disease   . S/P hemorrhoidectomy   . Arthritis   . Carotid stenosis, bilateral PER DUPLEX  04-10-2011    RICA  40-59%/  LICA 60-79%  . Cardiomyopathy secondary EF 30%  . Atrial fibrillation   . Hypercholesterolemia   . Spinal stenosis   . Neuromuscular disorder     neuropathy d/t amyloidosis  . ANXIETY   . DEPRESSION     Past Surgical History  Procedure Date  . Hemorrhoid surgery 2002  . Carpal tunnel release 2005    RIGHT  . Cysto/ bladder bx and fulgeration 02-21-2007  . Right sural nerve bx/ right gastrocemius muscle bx 09-25-2009  . Breast biopsy 1992  . Appendectomy 1961    . Extracorporeal shock wave lithotripsy 08-10-2011    RIGHT  . Transthoracic echocardiogram 11-24-2010    LVEF 35% WITH DIFFUSE HYPOKINESIS, WORSE IN THE BASE/MID ANTERIOR WALL AND LATERAL WALL/ BASAL INFERIOR/ INFEROSEPTAL AKINESIS/  CAVITY SIZE NORMAL/ WALL THICKNESS WAS INCREASED IN A PATTERN OF SEVERE LVH/ PAPAMETERS CONSISTENT WITH AN IRREVERSIBLE RESTRICTIVE PATTERN/  GRADE 4 DIASTOLIC DYSFUNCTION/ MILD TO MOD. MV REGUR./ BIL. ATRIUM MOD. DILATED/ RVSF MOD. REDUCED/ SMALL PERICARDIAL EFFUSION  . Cystoscopy 09/2011  . Tonsillectomy 10-23-11    child  . Cystoscopy with ureteroscopy 10/28/2011    Procedure: CYSTOSCOPY WITH URETEROSCOPY;  Surgeon: Milford Cage, MD;  Location: [facility];  Service: Urology;  Laterality: Right;    ROS:  As stated in the HPI and negative for all other systems.  PHYSICAL EXAM LMP 08/04/1991 PHYSICAL EXAM GEN:  No distress, frail appearing NECK:  Jugular venous distention at 90 degrees, waveform within normal limits, carotid upstroke brisk and symmetric, no bruits, no thyromegaly LYMPHATICS:  No cervical adenopathy LUNGS:  Clear to auscultation bilaterally BACK:  No CVA tenderness CHEST:  Unremarkable HEART:  S1 and S2 within normal limits, no S3, no clicks, no rubs, no murmurs ABD:  Positive bowel sounds normal in frequency in pitch, no bruits, no rebound, no guarding, unable to assess midline mass or bruit with the patient seated. 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 She might need 20 mg twice a day pending her response to this. Wound check a basic metabolic profile today and again in 3-4 days. We need to watch this closely. She cannot wear thigh-high compression  stockings which I would prefer. Can try to keep her feet as elevated as possible. She already avoids salt.  Atrial fibrillation  She remains on Coumadin. Rate is controlled.   Hypotension  She seems to tolerate this.  She will remain off beta blocker.

## 2012-04-11 NOTE — Patient Instructions (Addendum)
Please stop Furosemide Start Torsemide 20 mg a day.  If edema doesn't improve increase to twice a day. Continue all other medications as listed.  Please have blood work today and in 3 to 4 days.  Follow up with Dr Antoine Poche in 1 month.

## 2012-04-15 ENCOUNTER — Other Ambulatory Visit: Payer: Medicare Other

## 2012-04-18 ENCOUNTER — Ambulatory Visit: Payer: Medicare Other | Admitting: Physical Therapy

## 2012-04-18 ENCOUNTER — Other Ambulatory Visit (INDEPENDENT_AMBULATORY_CARE_PROVIDER_SITE_OTHER): Payer: Medicare Other

## 2012-04-18 DIAGNOSIS — I10 Essential (primary) hypertension: Secondary | ICD-10-CM

## 2012-04-18 LAB — BASIC METABOLIC PANEL
CO2: 30 mEq/L (ref 19–32)
Calcium: 8.8 mg/dL (ref 8.4–10.5)
Chloride: 97 mEq/L (ref 96–112)
Glucose, Bld: 87 mg/dL (ref 70–99)
Sodium: 137 mEq/L (ref 135–145)

## 2012-04-21 ENCOUNTER — Ambulatory Visit: Payer: Medicare Other | Admitting: Physical Therapy

## 2012-04-21 ENCOUNTER — Telehealth: Payer: Self-pay | Admitting: Cardiology

## 2012-04-21 DIAGNOSIS — I429 Cardiomyopathy, unspecified: Secondary | ICD-10-CM

## 2012-04-21 MED ORDER — TORSEMIDE 20 MG PO TABS: 40.0000 mg | ORAL_TABLET | Freq: Two times a day (BID) | ORAL | Status: DC

## 2012-04-21 NOTE — Telephone Encounter (Signed)
Pt states Torsemide has not helped at all and in fact it has gotten worse.  Pt reports wt is up 10 lbs.  Having trouble getting shoes on because of edema.  She took Lasix 80 mg this am and has had an increase in urine outpt.  Will review with Dr Antoine Poche.

## 2012-04-21 NOTE — Telephone Encounter (Signed)
Per Dr Antoine Poche pt to take 40 mg of Demadex twice a day, KCL 20 MEQ daily and repeat lab in 5 days.  Pt aware - does not need new RX at this time - will have blood work in 5 days

## 2012-04-21 NOTE — Telephone Encounter (Signed)
Pt would like blood work results, diurectic not working well, pls advise

## 2012-04-26 ENCOUNTER — Ambulatory Visit (INDEPENDENT_AMBULATORY_CARE_PROVIDER_SITE_OTHER): Payer: Medicare Other | Admitting: *Deleted

## 2012-04-26 ENCOUNTER — Ambulatory Visit: Payer: Medicare Other | Admitting: Physical Therapy

## 2012-04-26 ENCOUNTER — Telehealth: Payer: Self-pay | Admitting: *Deleted

## 2012-04-26 DIAGNOSIS — I429 Cardiomyopathy, unspecified: Secondary | ICD-10-CM

## 2012-04-26 DIAGNOSIS — I2510 Atherosclerotic heart disease of native coronary artery without angina pectoris: Secondary | ICD-10-CM

## 2012-04-26 DIAGNOSIS — I10 Essential (primary) hypertension: Secondary | ICD-10-CM

## 2012-04-26 LAB — BASIC METABOLIC PANEL
BUN: 32 mg/dL — ABNORMAL HIGH (ref 6–23)
Chloride: 97 mEq/L (ref 96–112)
Creatinine, Ser: 1.2 mg/dL (ref 0.4–1.2)
Glucose, Bld: 74 mg/dL (ref 70–99)
Potassium: 3.9 mEq/L (ref 3.5–5.1)

## 2012-04-26 MED ORDER — METOLAZONE 2.5 MG PO TABS: 2.5000 mg | ORAL_TABLET | Freq: Every day | ORAL | Status: DC

## 2012-04-26 MED ORDER — TORSEMIDE 20 MG PO TABS: 40.0000 mg | ORAL_TABLET | Freq: Two times a day (BID) | ORAL | Status: DC

## 2012-04-26 NOTE — Telephone Encounter (Signed)
Pt walked in for blood work.  Edema is no better with increase in Demadex as previously instructed.  Her wt in down 2 lbs.  Per Dr Antoine Poche - pt will take Zaroxolyn 2.5 mg once a day for 2 days.  She will follow up as scheduled.

## 2012-04-27 ENCOUNTER — Encounter: Payer: Self-pay | Admitting: Pharmacist

## 2012-04-27 LAB — BASIC METABOLIC PANEL
BUN: 33 mg/dL — ABNORMAL HIGH (ref 6–23)
CO2: 29 mEq/L (ref 19–32)
Calcium: 8.9 mg/dL (ref 8.4–10.5)
Creatinine, Ser: 1.1 mg/dL (ref 0.4–1.2)
GFR: 51.26 mL/min — ABNORMAL LOW (ref >60.00)
Glucose, Bld: 72 mg/dL (ref 70–99)

## 2012-04-28 ENCOUNTER — Ambulatory Visit: Payer: Medicare Other | Admitting: Physical Therapy

## 2012-05-02 ENCOUNTER — Ambulatory Visit (INDEPENDENT_AMBULATORY_CARE_PROVIDER_SITE_OTHER): Payer: Medicare Other | Admitting: *Deleted

## 2012-05-02 ENCOUNTER — Telehealth: Payer: Self-pay | Admitting: *Deleted

## 2012-05-02 DIAGNOSIS — Z7901 Long term (current) use of anticoagulants: Secondary | ICD-10-CM

## 2012-05-02 DIAGNOSIS — I498 Other specified cardiac arrhythmias: Secondary | ICD-10-CM

## 2012-05-02 LAB — POCT INR: INR: 3.6

## 2012-05-02 NOTE — Telephone Encounter (Signed)
Reduce to Torsemide 40 daily and take the Zaroxolyn if she has a two pound weight gain in 1 day.  She needs to let us know if she is needing this frequently.  She needs a BMET.

## 2012-05-02 NOTE — Telephone Encounter (Signed)
Pt came into office today for PT/INR - she brought her wts - since starting zaroxolyn on 9/4 (for 2 days)her wt has decreased from 132.4 to 125.4 lbs.  She states she is feeling well.  Some edema remains at feet and ankles bilaterally but she is able to put her support stockings on now.  She would like to know if she needs to continue with Torsemide 40 mg BID.  Instructed pt to continue on this dosage until review of this information by Dr Antoine Poche.  I will call her if he orders for her to decrease dosage.  She states understanding.

## 2012-05-02 NOTE — Telephone Encounter (Signed)
Pt aware of instructions and states understanding.

## 2012-05-09 ENCOUNTER — Ambulatory Visit (INDEPENDENT_AMBULATORY_CARE_PROVIDER_SITE_OTHER): Payer: Medicare Other | Admitting: Internal Medicine

## 2012-05-09 ENCOUNTER — Encounter: Payer: Self-pay | Admitting: Internal Medicine

## 2012-05-09 VITALS — BP 86/66 | HR 84 | Temp 97.2°F | Resp 15 | Wt 136.2 lb

## 2012-05-09 DIAGNOSIS — K589 Irritable bowel syndrome without diarrhea: Secondary | ICD-10-CM

## 2012-05-09 DIAGNOSIS — I429 Cardiomyopathy, unspecified: Secondary | ICD-10-CM

## 2012-05-09 DIAGNOSIS — E785 Hyperlipidemia, unspecified: Secondary | ICD-10-CM

## 2012-05-09 DIAGNOSIS — E538 Deficiency of other specified B group vitamins: Secondary | ICD-10-CM

## 2012-05-09 DIAGNOSIS — E8589 Other amyloidosis: Secondary | ICD-10-CM

## 2012-05-09 DIAGNOSIS — E852 Heredofamilial amyloidosis, unspecified: Secondary | ICD-10-CM

## 2012-05-09 DIAGNOSIS — G63 Polyneuropathy in diseases classified elsewhere: Secondary | ICD-10-CM

## 2012-05-09 DIAGNOSIS — N058 Unspecified nephritic syndrome with other morphologic changes: Secondary | ICD-10-CM

## 2012-05-09 DIAGNOSIS — H348392 Tributary (branch) retinal vein occlusion, unspecified eye, stable: Secondary | ICD-10-CM

## 2012-05-09 DIAGNOSIS — Z23 Encounter for immunization: Secondary | ICD-10-CM

## 2012-05-09 DIAGNOSIS — H348322 Tributary (branch) retinal vein occlusion, left eye, stable: Secondary | ICD-10-CM | POA: Insufficient documentation

## 2012-05-09 MED ORDER — CYANOCOBALAMIN 1000 MCG/ML IJ SOLN
1000.0000 ug | Freq: Once | INTRAMUSCULAR | Status: AC
  Administered 2012-05-09: 1000 ug via INTRAMUSCULAR

## 2012-05-09 NOTE — Assessment & Plan Note (Signed)
Hx same - but intol of statins due to myopathy/amyloid

## 2012-05-09 NOTE — Progress Notes (Signed)
Subjective:    Patient ID: Miranda [name], female    DOB: 24-May-1941, 71 y.o.   MRN: [id number]  HPI here for follow up - reviewed chronic medical issues today:  familial amyloidosis - dx in 09/2009 after extensive neuro eval locally and [facility] - nerve and muscle bx 09/2009 confirmed amyloid and genetic testin 01/2010 confirmed AD dz - s/p specialty eval at [facility] 03/2010 - using off label diflunisal for same - complicated by CM (since 2008), periph neuropathy with sensorimotor deficits, autonomic dysfx of GI including satiety and "IBS-like" alternating bowels  Atrial fibrillation - related to CM - on chronic anticoag with [facility] for same  B12 deficiency - diagnosed by neurology during workup in 2009 - prior IM shots at PCP/neuro -resumed same may 2013  Cardiomyopathy - dx 2008 - denies increase/change in edema, dyspnea on exertion or syncope - no PND - the patient reports compliance with medication(s) as prescribed. Denies adverse side effects. Works with cards regularly on same  Past Medical History  Diagnosis Date  . Lactose intolerance   . IBS (irritable bowel syndrome)   . Dyslipidemia   . Hypertension   . 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 Kidney stone     R ureteral stone  . Coronary artery disease   . S/P hemorrhoidectomy   . Arthritis   . Carotid stenosis, bilateral PER DUPLEX  04-10-2011    RICA  40-59%/  LICA 60-79%  . Cardiomyopathy secondary EF 30%  . Atrial fibrillation     chronic anticoag  . Hypercholesterolemia   . Spinal stenosis   . Neuromuscular disorder     neuropathy d/t amyloidosis  . ANXIETY   . DEPRESSION     Review of Systems  Constitutional: Negative for fever or weight change.  Respiratory: Negative for cough and shortness of breath.   Cardiovascular: Negative for chest pain or palpitations.      Objective:   Physical Exam  BP 86/66  Pulse 84  Temp 97.2  F (36.2 C) (Oral)  Resp 15  Wt 136 lb 4 oz (61.803 kg)  SpO2 97%  LMP 08/04/1991 Wt Readings from Last 3 Encounters:  05/09/12 136 lb 4 oz (61.803 kg)  04/11/12 140 lb 12.8 oz (63.866 kg)  04/07/12 141 lb (63.957 kg)   Constitutional: She is thin, but appears well-developed and well-nourished. No distress. Uses RW Neck: Normal range of motion. Neck supple. No JVD or LAD present. No thyromegaly present.  Cardiovascular: Irregualr rate and rhythm and normal heart sounds.  No murmur heard. 2+ BLE edema, wearing compression hose. Pulmonary/Chest: Effort normal and breath sounds normal. No respiratory distress. She has no wheezes.  Neurological: She is alert and oriented to person, place, and time. No cranial nerve deficit. Coordination slow but normal.  Skin: Skin is warm and dry. No rash noted. No erythema.  Psychiatric: She has a normal mood and affect. Her behavior is normal. Judgment and thought content normal.   Lab Results  Component Value Date   WBC 8.7 10/20/2011   HGB 12.3 10/20/2011   HCT 37.3 10/20/2011   PLT 241 10/20/2011   GLUCOSE 72 04/26/2012   CHOL 169 12/10/2008   TRIG 53.0 12/10/2008   HDL 65.10 12/10/2008   LDLCALC 93 12/10/2008   ALT 13 12/10/2008   AST 20 12/10/2008   NA 138 04/26/2012   K 4.0 04/26/2012   CL 99  04/26/2012   CREATININE 1.1 04/26/2012   BUN 33* 04/26/2012   CO2 29 04/26/2012   INR 3.6 05/02/2012       Assessment & Plan:  See problem list. Medications and labs reviewed today.  Time spent with pt today 25 minutes, greater than 50% time spent counseling patient on familial amyloidosis, B12 deficiency and autonomic/periph neuropathy symptoms mgmt with medication review. Also review of prior records

## 2012-05-09 NOTE — Patient Instructions (Signed)
It was good to see you today. We have reviewed your interval records including labs and tests today Flu shot and B12 shot given today - please return every month for nurse visit to give this B12 injection as needed Medications reviewed, no changes at this time.  Test(s) ordered today for cholesterol at your next visit. Your results will be called to you after review (48-72hours after test completion). If any changes need to be made, you will be notified at that time. Please schedule followup in 6 months, call sooner if problems.

## 2012-05-09 NOTE — Assessment & Plan Note (Signed)
Dx 2008 - related to amyloidosis Increase in peripheral edema despite increase Lasix - changed to torsemide by cards Wearing compression hose, knee high Follows closely with cards for same (hochrein) The current medical regimen is effective;  continue present plan and medications.  Check screening for lipids next labs - but note intolerance to statins

## 2012-05-09 NOTE — Assessment & Plan Note (Signed)
Ongoing B12 replacement by neuro since 2009 but none in recent months due to change in providers Resumed 12/2011 - continue monthly IM replacement

## 2012-05-09 NOTE — Assessment & Plan Note (Signed)
Pending evaluation at [facility] gastroenterology for evaluation of same Discussed colonoscopy indications. Last done in August 2003, but declines routine screening at this time given her other medical comorbidities, as we feel risk greater than benefit for screening at this time

## 2012-05-09 NOTE — Assessment & Plan Note (Signed)
Ongoing off label use diflunisal for same HX again reviewed today including complications and progression of disease Increase gabapentin for GI symptoms and neuropathy - see "IBS" above regarding pending evaluation at tertiary center [facility]) No other tx changes recommended -  To follow up with local neuro and [facility] Loop for other tx needs as they arise

## 2012-05-19 ENCOUNTER — Ambulatory Visit (INDEPENDENT_AMBULATORY_CARE_PROVIDER_SITE_OTHER): Payer: Medicare Other | Admitting: Cardiology

## 2012-05-19 ENCOUNTER — Encounter: Payer: Self-pay | Admitting: Cardiology

## 2012-05-19 ENCOUNTER — Ambulatory Visit (INDEPENDENT_AMBULATORY_CARE_PROVIDER_SITE_OTHER): Payer: Medicare Other | Admitting: *Deleted

## 2012-05-19 VITALS — BP 80/60 | HR 70 | Ht 65.0 in | Wt 141.1 lb

## 2012-05-19 DIAGNOSIS — Z7901 Long term (current) use of anticoagulants: Secondary | ICD-10-CM

## 2012-05-19 DIAGNOSIS — I498 Other specified cardiac arrhythmias: Secondary | ICD-10-CM

## 2012-05-19 DIAGNOSIS — I10 Essential (primary) hypertension: Secondary | ICD-10-CM

## 2012-05-19 DIAGNOSIS — I2510 Atherosclerotic heart disease of native coronary artery without angina pectoris: Secondary | ICD-10-CM

## 2012-05-19 DIAGNOSIS — I420 Dilated cardiomyopathy: Secondary | ICD-10-CM

## 2012-05-19 DIAGNOSIS — I428 Other cardiomyopathies: Secondary | ICD-10-CM

## 2012-05-19 DIAGNOSIS — E785 Hyperlipidemia, unspecified: Secondary | ICD-10-CM

## 2012-05-19 DIAGNOSIS — I429 Cardiomyopathy, unspecified: Secondary | ICD-10-CM

## 2012-05-19 LAB — BASIC METABOLIC PANEL
Calcium: 8.7 mg/dL (ref 8.4–10.5)
Chloride: 101 mEq/L (ref 96–112)
Creatinine, Ser: 1.1 mg/dL (ref 0.4–1.2)

## 2012-05-19 LAB — POCT INR: INR: 4.2

## 2012-05-19 NOTE — Patient Instructions (Addendum)
Please take Demadex 40 mg twice a day Take Zaroxolyn 2.5 for 2 days  Have blood work today and Tuesday next week. (BMP)  Follow up in 2 weeks.

## 2012-05-19 NOTE — Progress Notes (Signed)
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   We continue to struggle with her volume. Rate limiting issue here is hypotension. She's not had any presyncope or syncope. She's having no chest pressure, neck or arm discomfort. She doesn't notice any palpitations. However, she does have increased lower extremity and abdominal swelling. She's not describing new PND or orthopnea. Her weakness is at baseline.  Allergies  Allergen Reactions  . Statins Other (See Comments)    LEG CRAMPS     Current Outpatient Prescriptions  Medication Sig Dispense Refill  . Alpha-D-Galactosidase (BEANO PO) Take 1-2 tablets by mouth 2 (two) times daily as needed. GAS       . Calcium Citrate-Vitamin D (CITRACAL + D PO) Take 2 tablets by mouth daily.       . diflunisal (DOLOBID) 500 MG TABS Take 0.5 tablets (250 mg total) by mouth 2 (two) times daily.  60 tablet  6  . docusate sodium (COLACE) 100 MG capsule Take 100 mg by mouth as needed.       Marland Kitchen esomeprazole (NEXIUM) 40 MG capsule Take 1 capsule (40 mg total) by mouth 2 (two) times daily.  60 capsule  5  . ferrous sulfate 325 (65 FE) MG tablet Take 325 mg by mouth daily with breakfast.       . gabapentin (NEURONTIN) 100 MG capsule Take 1-3 capsules (100-300 mg total) by mouth 3 (three) times daily.  270 capsule  11  . Hypromellose (GENTEAL) 0.3 % SOLN Place 1 drop into both eyes 2 (two) times daily as needed. DRY EYES       . loperamide (IMODIUM) 2 MG capsule Take 2 mg by mouth 2 (two) times daily.       . metolazone (ZAROXOLYN) 2.5 MG tablet Take 1 tablet (2.5 mg total) by mouth daily.  30 tablet  1  . Multiple Vitamins-Minerals (MULTIVITAMINS THER. W/MINERALS) TABS Take 1 tablet by mouth daily.       . Nutritional Supplements (CARNATION INSTANT BREAKFAST PO) Take 1 Package by mouth 2 (two) times daily.        . potassium chloride (K-DUR) 10 MEQ tablet Take 10-30 mEq by mouth daily as needed. FLUID       . Probiotic Product (ALIGN PO)  Take 1 capsule by mouth daily.       . Simethicone 180 MG CAPS Take 1-2 capsules by mouth 2 (two) times daily as needed. GAS       . torsemide (DEMADEX) 20 MG tablet Take 2 tablets (40 mg total) by mouth 2 (two) times daily.  120 tablet  11  . warfarin (COUMADIN) 2.5 MG tablet Take as directed by coumadin clinic.  40 tablet  3    Past Medical History  Diagnosis Date  . Lactose intolerance   . IBS (irritable bowel syndrome)   . Dyslipidemia   . Hypertension   . 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 Kidney stone     R ureteral stone  . Coronary artery disease   . S/P hemorrhoidectomy   . Arthritis   . Carotid stenosis, bilateral PER DUPLEX  04-10-2011    RICA  40-59%/  LICA 60-79%  . Cardiomyopathy secondary EF 30%  . Atrial fibrillation     chronic anticoag  . Hypercholesterolemia   . Spinal stenosis   . Neuromuscular disorder     neuropathy d/t amyloidosis  .  ANXIETY   . DEPRESSION   . Branch retinal vein occlusion of left eye     12/2011 event - vision improving - Following with optho and retinal specialist for same    Past Surgical History  Procedure Date  . Hemorrhoid surgery 2002  . Carpal tunnel release 2005    RIGHT  . Cysto/ bladder bx and fulgeration 02-21-2007  . Right sural nerve bx/ right gastrocemius muscle bx 09-25-2009  . Breast biopsy 1992  . Appendectomy 1961  . Extracorporeal shock wave lithotripsy 08-10-2011    RIGHT  . Transthoracic echocardiogram 11-24-2010    LVEF 35% WITH DIFFUSE HYPOKINESIS, WORSE IN THE BASE/MID ANTERIOR WALL AND LATERAL WALL/ BASAL INFERIOR/ INFEROSEPTAL AKINESIS/  CAVITY SIZE NORMAL/ WALL THICKNESS WAS INCREASED IN A PATTERN OF SEVERE LVH/ PAPAMETERS CONSISTENT WITH AN IRREVERSIBLE RESTRICTIVE PATTERN/  GRADE 4 DIASTOLIC DYSFUNCTION/ MILD TO MOD. MV REGUR./ BIL. ATRIUM MOD. DILATED/ RVSF MOD. REDUCED/ SMALL PERICARDIAL EFFUSION  . Cystoscopy 09/2011   . Tonsillectomy 10-23-11    child  . Cystoscopy with ureteroscopy 10/28/2011    Procedure: CYSTOSCOPY WITH URETEROSCOPY;  Surgeon: Milford Cage, MD;  Location: [facility];  Service: Urology;  Laterality: Right;    ROS:  As stated in the HPI and negative for all other systems.  PHYSICAL EXAM BP 80/60  Pulse 70  Ht 5\' 5"  (1.651 m)  Wt 141 lb 1.9 oz (64.012 kg)  BMI 23.48 kg/m2  LMP 08/04/1991 PHYSICAL EXAM GEN:  No distress, frail appearing NECK:  Jugular venous distention at 90 degrees, waveform within normal limits, carotid upstroke brisk and symmetric, no bruits, no thyromegaly LYMPHATICS:  No cervical adenopathy LUNGS:  Clear to auscultation bilaterally BACK:  No CVA tenderness CHEST:  Unremarkable HEART:  S1 and S2 within normal limits, no S3, no clicks, no rubs, no murmurs ABD:  Positive bowel sounds normal in frequency in pitch, no bruits, no rebound, no guarding, unable to assess midline mass or bruit with the patient seated. 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 She is only taking her Demadex once daily because I cut this back last time with hypotension. It seems however that she's going to need this twice daily and for the next 2 days Zaroxolyn 2.5 mg. She will essentially stayed in a recliner with her feet for the next few days while we are trying to get his fluid down. She wears her compression stockings and ambulates periodically. She remains on her blood thinner. I will check a basic metabolic profile today and again early next week. I think close followup and frequent visits with blood draws is the only way to keep her out of the hospital.  Atrial fibrillation  She remains on Coumadin. Rate is controlled.    Hypotension  She seems to tolerate this.  She will remain off beta blocker.

## 2012-05-23 ENCOUNTER — Other Ambulatory Visit: Payer: Self-pay | Admitting: Cardiology

## 2012-05-23 NOTE — Telephone Encounter (Signed)
..   Requested Prescriptions   Pending Prescriptions Disp Refills  . potassium chloride (K-DUR) 10 MEQ tablet [Pharmacy Med Name: POTASSIUM CHLORIDE ER 10 MEQ TAB ZYDU] 90 tablet 3    Sig: TAKE THREE TABLETS BY MOUTH DAILY OR AS DIRECTED

## 2012-05-24 ENCOUNTER — Other Ambulatory Visit (INDEPENDENT_AMBULATORY_CARE_PROVIDER_SITE_OTHER): Payer: Medicare Other

## 2012-05-24 DIAGNOSIS — I428 Other cardiomyopathies: Secondary | ICD-10-CM

## 2012-05-24 DIAGNOSIS — I420 Dilated cardiomyopathy: Secondary | ICD-10-CM

## 2012-05-24 LAB — BASIC METABOLIC PANEL
BUN: 41 mg/dL — ABNORMAL HIGH (ref 6–23)
CO2: 29 mEq/L (ref 19–32)
Calcium: 8.4 mg/dL (ref 8.4–10.5)
Chloride: 98 mEq/L (ref 96–112)
Creatinine, Ser: 1.2 mg/dL (ref 0.4–1.2)

## 2012-05-25 ENCOUNTER — Other Ambulatory Visit: Payer: Self-pay | Admitting: Cardiology

## 2012-05-25 DIAGNOSIS — I6529 Occlusion and stenosis of unspecified carotid artery: Secondary | ICD-10-CM

## 2012-05-26 ENCOUNTER — Telehealth: Payer: Self-pay | Admitting: *Deleted

## 2012-05-26 DIAGNOSIS — I428 Other cardiomyopathies: Secondary | ICD-10-CM

## 2012-05-26 NOTE — Telephone Encounter (Signed)
Called pt to review lab results.  She states she has been taking her medications as prescribed however her wt doesn't seem to be coming down like it did before.  Her wt on her scales is staying around 133 - 134 lbs which would be appr 138 lbs here.  Her feet and legs remain swollen as well.  She would like to know if OK to take another Zaroxolyn.  Will forward information to Dr Antoine Poche for review and orders.

## 2012-05-27 NOTE — Telephone Encounter (Signed)
Per Dr Antoine Poche - pt to take Zaroxolyn today, Saturday and Sunday  She will have blood work on 10/7 (BMP)  Pt aware

## 2012-05-30 ENCOUNTER — Other Ambulatory Visit (INDEPENDENT_AMBULATORY_CARE_PROVIDER_SITE_OTHER): Payer: Medicare Other

## 2012-05-30 ENCOUNTER — Encounter (INDEPENDENT_AMBULATORY_CARE_PROVIDER_SITE_OTHER): Payer: Medicare Other

## 2012-05-30 DIAGNOSIS — I428 Other cardiomyopathies: Secondary | ICD-10-CM

## 2012-05-30 DIAGNOSIS — I6529 Occlusion and stenosis of unspecified carotid artery: Secondary | ICD-10-CM

## 2012-05-30 LAB — BASIC METABOLIC PANEL
BUN: 44 mg/dL — ABNORMAL HIGH (ref 6–23)
Calcium: 8.8 mg/dL (ref 8.4–10.5)
GFR: 44.27 mL/min — ABNORMAL LOW (ref >60.00)
Glucose, Bld: 85 mg/dL (ref 70–99)
Sodium: 140 mEq/L (ref 135–145)

## 2012-06-06 ENCOUNTER — Ambulatory Visit (INDEPENDENT_AMBULATORY_CARE_PROVIDER_SITE_OTHER): Payer: Medicare Other | Admitting: *Deleted

## 2012-06-06 ENCOUNTER — Encounter: Payer: Self-pay | Admitting: Cardiology

## 2012-06-06 ENCOUNTER — Ambulatory Visit (INDEPENDENT_AMBULATORY_CARE_PROVIDER_SITE_OTHER): Payer: Medicare Other | Admitting: Cardiology

## 2012-06-06 VITALS — BP 81/53 | HR 99 | Ht 65.0 in | Wt 143.8 lb

## 2012-06-06 DIAGNOSIS — I498 Other specified cardiac arrhythmias: Secondary | ICD-10-CM

## 2012-06-06 DIAGNOSIS — N289 Disorder of kidney and ureter, unspecified: Secondary | ICD-10-CM

## 2012-06-06 DIAGNOSIS — Z7901 Long term (current) use of anticoagulants: Secondary | ICD-10-CM

## 2012-06-06 LAB — POCT INR: INR: 2.8

## 2012-06-06 NOTE — Progress Notes (Signed)
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   We continue to struggle with her volume. She has taken a few doses of Zaroxolyn since I last saw her. She's not having any acute shortness of breath, PND or orthopnea. We have been closely following her renal function and her BUN is up slightly but her creatinine is stable.  We are limited by renal insufficiency and hypotension. Her weight today is 136.6. Her dry weight recently was 131.6.  Allergies  Allergen Reactions  . Statins Other (See Comments)    LEG CRAMPS     Current Outpatient Prescriptions  Medication Sig Dispense Refill  . Alpha-D-Galactosidase (BEANO PO) Take 1-2 tablets by mouth 2 (two) times daily as needed. GAS       . Calcium Citrate-Vitamin D (CITRACAL + D PO) Take 2 tablets by mouth daily.       . diflunisal (DOLOBID) 500 MG TABS Take 0.5 tablets (250 mg total) by mouth 2 (two) times daily.  60 tablet  6  . docusate sodium (COLACE) 100 MG capsule Take 100 mg by mouth as needed.       Marland Kitchen esomeprazole (NEXIUM) 40 MG capsule Take 1 capsule (40 mg total) by mouth 2 (two) times daily.  60 capsule  5  . ferrous sulfate 325 (65 FE) MG tablet Take 325 mg by mouth daily with breakfast.       . gabapentin (NEURONTIN) 100 MG capsule Take 1-3 capsules (100-300 mg total) by mouth 3 (three) times daily.  270 capsule  11  . Hypromellose (GENTEAL) 0.3 % SOLN Place 1 drop into both eyes 2 (two) times daily as needed. DRY EYES       . loperamide (IMODIUM) 2 MG capsule Take 2 mg by mouth 2 (two) times daily.       . metolazone (ZAROXOLYN) 2.5 MG tablet Take 1 tablet (2.5 mg total) by mouth daily.  30 tablet  1  . Multiple Vitamins-Minerals (MULTIVITAMINS THER. W/MINERALS) TABS Take 1 tablet by mouth daily.       . Nutritional Supplements (CARNATION INSTANT BREAKFAST PO) Take 1 Package by mouth 2 (two) times daily.        . potassium chloride (K-DUR) 10 MEQ tablet Take 10-30 mEq by mouth daily as needed. FLUID       .  Probiotic Product (ALIGN PO) Take 1 capsule by mouth daily.       . Simethicone 180 MG CAPS Take 1-2 capsules by mouth 2 (two) times daily as needed. GAS       . torsemide (DEMADEX) 20 MG tablet Take 2 tablets (40 mg total) by mouth 2 (two) times daily.  120 tablet  11  . warfarin (COUMADIN) 2.5 MG tablet Take as directed by coumadin clinic.  40 tablet  3  . DISCONTD: potassium chloride (K-DUR) 10 MEQ tablet TAKE THREE TABLETS BY MOUTH DAILY OR AS DIRECTED  90 tablet  3    Past Medical History  Diagnosis Date  . Lactose intolerance   . IBS (irritable bowel syndrome)   . Dyslipidemia   . Hypertension   . 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 Kidney stone     R ureteral stone  . Coronary artery disease   . S/P hemorrhoidectomy   . Arthritis   . Carotid stenosis, bilateral PER DUPLEX  04-10-2011    RICA  40-59%/  LICA  60-79%  . Cardiomyopathy secondary EF 30%  . Atrial fibrillation     chronic anticoag  . Hypercholesterolemia   . Spinal stenosis   . Neuromuscular disorder     neuropathy d/t amyloidosis  . ANXIETY   . DEPRESSION   . Branch retinal vein occlusion of left eye     12/2011 event - vision improving - Following with optho and retinal specialist for same    Past Surgical History  Procedure Date  . Hemorrhoid surgery 2002  . Carpal tunnel release 2005    RIGHT  . Cysto/ bladder bx and fulgeration 02-21-2007  . Right sural nerve bx/ right gastrocemius muscle bx 09-25-2009  . Breast biopsy 1992  . Appendectomy 1961  . Extracorporeal shock wave lithotripsy 08-10-2011    RIGHT  . Transthoracic echocardiogram 11-24-2010    LVEF 35% WITH DIFFUSE HYPOKINESIS, WORSE IN THE BASE/MID ANTERIOR WALL AND LATERAL WALL/ BASAL INFERIOR/ INFEROSEPTAL AKINESIS/  CAVITY SIZE NORMAL/ WALL THICKNESS WAS INCREASED IN A PATTERN OF SEVERE LVH/ PAPAMETERS CONSISTENT WITH AN IRREVERSIBLE RESTRICTIVE PATTERN/   GRADE 4 DIASTOLIC DYSFUNCTION/ MILD TO MOD. MV REGUR./ BIL. ATRIUM MOD. DILATED/ RVSF MOD. REDUCED/ SMALL PERICARDIAL EFFUSION  . Cystoscopy 09/2011  . Tonsillectomy 10-23-11    child  . Cystoscopy with ureteroscopy 10/28/2011    Procedure: CYSTOSCOPY WITH URETEROSCOPY;  Surgeon: Milford Cage, MD;  Location: [facility];  Service: Urology;  Laterality: Right;    ROS:  As stated in the HPI and negative for all other systems.  PHYSICAL EXAM BP 81/53  Pulse 99  Ht 5\' 5"  (1.651 m)  Wt 143 lb 12.8 oz (65.227 kg)  BMI 23.93 kg/m2  LMP 08/04/1991 PHYSICAL EXAM GEN:  No distress, frail appearing NECK:  Jugular venous distention at 90 degrees, waveform within normal limits, carotid upstroke brisk and symmetric, no bruits, no thyromegaly LYMPHATICS:  No cervical adenopathy LUNGS:  Clear to auscultation bilaterally BACK:  No CVA tenderness CHEST:  Unremarkable HEART:  S1 and S2 within normal limits, no S3, no clicks, no rubs, no murmurs ABD:  Positive bowel sounds normal in frequency in pitch, no bruits, no rebound, no guarding, unable to assess midline mass or bruit with the patient seated. 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  I will add Zaroxolyn Monday Wednesday and Friday 2.5 mg. She will keep her daily weights and call us as she has been doing significant changes. She will come back next Monday for a basic metabolic profile. We just need to see her frequently and check lab work frequently to try to manage her volume.  Atrial fibrillation  She remains on Coumadin. Rate is controlled.   Hypotension  She seems to tolerate this.  She will remain off beta blocker.

## 2012-06-06 NOTE — Patient Instructions (Addendum)
Please take Zaroxolyn 2.5 mg Monday, Wednesday and Friday. Continue all other medications as listed  Please have blood work on Monday (BMP)  Follow up with Dr Antoine Poche in 3 weeks.

## 2012-06-08 ENCOUNTER — Telehealth: Payer: Self-pay

## 2012-06-08 ENCOUNTER — Ambulatory Visit (INDEPENDENT_AMBULATORY_CARE_PROVIDER_SITE_OTHER): Payer: Medicare Other | Admitting: *Deleted

## 2012-06-08 ENCOUNTER — Other Ambulatory Visit (INDEPENDENT_AMBULATORY_CARE_PROVIDER_SITE_OTHER): Payer: Medicare Other

## 2012-06-08 DIAGNOSIS — E538 Deficiency of other specified B group vitamins: Secondary | ICD-10-CM

## 2012-06-08 DIAGNOSIS — G63 Polyneuropathy in diseases classified elsewhere: Secondary | ICD-10-CM

## 2012-06-08 DIAGNOSIS — E785 Hyperlipidemia, unspecified: Secondary | ICD-10-CM

## 2012-06-08 LAB — LIPID PANEL: Total CHOL/HDL Ratio: 3

## 2012-06-08 MED ORDER — CYANOCOBALAMIN 1000 MCG/ML IJ SOLN
1000.0000 ug | Freq: Once | INTRAMUSCULAR | Status: AC
  Administered 2012-06-08: 1000 ug via INTRAMUSCULAR

## 2012-06-08 NOTE — Telephone Encounter (Signed)
Put lab order in. 

## 2012-06-13 ENCOUNTER — Other Ambulatory Visit: Payer: Medicare Other

## 2012-06-14 ENCOUNTER — Other Ambulatory Visit (INDEPENDENT_AMBULATORY_CARE_PROVIDER_SITE_OTHER): Payer: Medicare Other

## 2012-06-14 DIAGNOSIS — N289 Disorder of kidney and ureter, unspecified: Secondary | ICD-10-CM

## 2012-06-14 LAB — BASIC METABOLIC PANEL
CO2: 35 mEq/L — ABNORMAL HIGH (ref 19–32)
Chloride: 96 mEq/L (ref 96–112)
Sodium: 139 mEq/L (ref 135–145)

## 2012-06-16 ENCOUNTER — Telehealth: Payer: Self-pay | Admitting: Cardiology

## 2012-06-16 NOTE — Telephone Encounter (Signed)
New problem:  Need to schedule an endo at [facility] they will not schedule until medical clearance is done.  Advise on coumadin &  Metoprolol.

## 2012-06-16 NOTE — Telephone Encounter (Signed)
Will review with Dr Hochrein 

## 2012-06-17 ENCOUNTER — Telehealth: Payer: Self-pay

## 2012-06-17 NOTE — Telephone Encounter (Signed)
She could hold the warfarin if they need to for this procedure.  (I don't know what they are asking about beta blocker for.)

## 2012-06-17 NOTE — Telephone Encounter (Signed)
PATIENT AWARE OF LAB RESULTS °

## 2012-06-17 NOTE — Telephone Encounter (Signed)
Message copied by Yolonda Kida on Fri Jun 17, 2012  8:48 AM ------      Message from: Rollene Rotunda      Created: Wed Jun 15, 2012  4:50 PM       Labs OK.  Call Ms. Wolske with the results.

## 2012-06-17 NOTE — Telephone Encounter (Signed)
Per Dr Antoine Poche - pt can have endoscopy however she is chronically hypotensive and extremely volume sensitive.  Extra care needs to be taken to maintain adequate volume stasis with out over loading her.

## 2012-06-20 ENCOUNTER — Telehealth: Payer: Self-pay | Admitting: Cardiology

## 2012-06-20 NOTE — Telephone Encounter (Signed)
New Problem:    Patient called in wanting to speak with you for a minute about an upcoming procedure.  Please call back.  Patient is aware that you were not in the office when this message was taken.

## 2012-06-21 ENCOUNTER — Encounter: Payer: Self-pay | Admitting: *Deleted

## 2012-06-21 NOTE — Telephone Encounter (Signed)
Reviewed information and needs with pt who states understanding.  A letter of clearance will be sent to Dr Lavone Orn at [facility]. (addressed to her but mailed to pt).

## 2012-06-21 NOTE — Telephone Encounter (Signed)
Please see previous note.

## 2012-06-28 ENCOUNTER — Ambulatory Visit (INDEPENDENT_AMBULATORY_CARE_PROVIDER_SITE_OTHER): Payer: Medicare Other

## 2012-06-28 DIAGNOSIS — Z7901 Long term (current) use of anticoagulants: Secondary | ICD-10-CM

## 2012-06-28 DIAGNOSIS — I498 Other specified cardiac arrhythmias: Secondary | ICD-10-CM

## 2012-06-28 LAB — POCT INR: INR: 2.5

## 2012-07-07 ENCOUNTER — Ambulatory Visit (INDEPENDENT_AMBULATORY_CARE_PROVIDER_SITE_OTHER): Payer: Medicare Other

## 2012-07-07 ENCOUNTER — Ambulatory Visit (INDEPENDENT_AMBULATORY_CARE_PROVIDER_SITE_OTHER): Payer: Medicare Other | Admitting: Cardiology

## 2012-07-07 ENCOUNTER — Encounter: Payer: Self-pay | Admitting: Cardiology

## 2012-07-07 VITALS — BP 90/60 | HR 64 | Ht 65.0 in | Wt 140.0 lb

## 2012-07-07 DIAGNOSIS — G63 Polyneuropathy in diseases classified elsewhere: Secondary | ICD-10-CM

## 2012-07-07 DIAGNOSIS — I6529 Occlusion and stenosis of unspecified carotid artery: Secondary | ICD-10-CM

## 2012-07-07 DIAGNOSIS — I2510 Atherosclerotic heart disease of native coronary artery without angina pectoris: Secondary | ICD-10-CM

## 2012-07-07 DIAGNOSIS — I428 Other cardiomyopathies: Secondary | ICD-10-CM

## 2012-07-07 DIAGNOSIS — E538 Deficiency of other specified B group vitamins: Secondary | ICD-10-CM

## 2012-07-07 LAB — BASIC METABOLIC PANEL
CO2: 33 mEq/L — ABNORMAL HIGH (ref 19–32)
Calcium: 8.6 mg/dL (ref 8.4–10.5)
GFR: 34.09 mL/min — ABNORMAL LOW (ref >60.00)
Potassium: 3.6 mEq/L (ref 3.5–5.1)
Sodium: 138 mEq/L (ref 135–145)

## 2012-07-07 MED ORDER — CYANOCOBALAMIN 1000 MCG/ML IJ SOLN
1000.0000 ug | Freq: Once | INTRAMUSCULAR | Status: AC
  Administered 2012-07-07: 1000 ug via INTRAMUSCULAR

## 2012-07-07 NOTE — Progress Notes (Signed)
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   Today she has no shortness of breath. Her weights have been relatively stable and occasionally start to go up. She's been taking Zaroxolyn when necessary with her current dose of diuretic. She may not be regulating her salt and strictly as I would like. She's going to start trying to keep her feet more elevated.  We did discuss again when necessary dosing of her diuretic. She's actually not having any PND or orthopnea. She's not noticing any palpitations. Despite her low blood pressures not had any presyncope or syncope.  Allergies  Allergen Reactions  . Statins Other (See Comments)    LEG CRAMPS     Current Outpatient Prescriptions  Medication Sig Dispense Refill  . Alpha-D-Galactosidase (BEANO PO) Take 1-2 tablets by mouth 2 (two) times daily as needed. GAS       . Calcium Citrate-Vitamin D (CITRACAL + D PO) Take 2 tablets by mouth daily.       . diflunisal (DOLOBID) 500 MG TABS Take 0.5 tablets (250 mg total) by mouth 2 (two) times daily.  60 tablet  6  . docusate sodium (COLACE) 100 MG capsule Take 100 mg by mouth as needed.       Marland Kitchen esomeprazole (NEXIUM) 40 MG capsule Take 1 capsule (40 mg total) by mouth 2 (two) times daily.  60 capsule  5  . ferrous sulfate 325 (65 FE) MG tablet Take 325 mg by mouth daily with breakfast.       . gabapentin (NEURONTIN) 100 MG capsule Take 1-3 capsules (100-300 mg total) by mouth 3 (three) times daily.  270 capsule  11  . Hypromellose (GENTEAL) 0.3 % SOLN Place 1 drop into both eyes 2 (two) times daily as needed. DRY EYES       . loperamide (IMODIUM) 2 MG capsule Take 2 mg by mouth 2 (two) times daily.       . metolazone (ZAROXOLYN) 2.5 MG tablet Take 1 tablet (2.5 mg total) by mouth daily.  30 tablet  1  . Multiple Vitamins-Minerals (MULTIVITAMINS THER. W/MINERALS) TABS Take 1 tablet by mouth daily.       . Nutritional Supplements (CARNATION INSTANT BREAKFAST PO) Take 1 Package by  mouth 2 (two) times daily.        . potassium chloride (K-DUR) 10 MEQ tablet Take 10-30 mEq by mouth daily as needed. FLUID       . Probiotic Product (ALIGN PO) Take 1 capsule by mouth daily.       . Simethicone 180 MG CAPS Take 1-2 capsules by mouth 2 (two) times daily as needed. GAS       . torsemide (DEMADEX) 20 MG tablet Take 2 tablets (40 mg total) by mouth 2 (two) times daily.  120 tablet  11  . warfarin (COUMADIN) 2.5 MG tablet Take as directed by coumadin clinic.  40 tablet  3   No current facility-administered medications for this visit.   Facility-Administered Medications Ordered in Other Visits  Medication Dose Route Frequency Provider Last Rate Last Dose  . [COMPLETED] cyanocobalamin ((VITAMIN B-12)) injection 1,000 mcg  1,000 mcg Intramuscular Once Newt Lukes, MD   1,000 mcg at 07/07/12 1014    Past Medical History  Diagnosis Date  . Lactose intolerance   . IBS (irritable bowel syndrome)   . Dyslipidemia   . Hypertension   . 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 Kidney stone     R ureteral stone  . Coronary artery disease   . S/P hemorrhoidectomy   . Arthritis   . Carotid stenosis, bilateral PER DUPLEX  04-10-2011    RICA  40-59%/  LICA 60-79%  . Cardiomyopathy secondary EF 30%  . Atrial fibrillation     chronic anticoag  . Hypercholesterolemia   . Spinal stenosis   . Neuromuscular disorder     neuropathy d/t amyloidosis  . ANXIETY   . DEPRESSION   . Branch retinal vein occlusion of left eye     12/2011 event - vision improving - Following with optho and retinal specialist for same    Past Surgical History  Procedure Date  . Hemorrhoid surgery 2002  . Carpal tunnel release 2005    RIGHT  . Cysto/ bladder bx and fulgeration 02-21-2007  . Right sural nerve bx/ right gastrocemius muscle bx 09-25-2009  . Breast biopsy 1992  . Appendectomy 1961  . Extracorporeal shock  wave lithotripsy 08-10-2011    RIGHT  . Transthoracic echocardiogram 11-24-2010    LVEF 35% WITH DIFFUSE HYPOKINESIS, WORSE IN THE BASE/MID ANTERIOR WALL AND LATERAL WALL/ BASAL INFERIOR/ INFEROSEPTAL AKINESIS/  CAVITY SIZE NORMAL/ WALL THICKNESS WAS INCREASED IN A PATTERN OF SEVERE LVH/ PAPAMETERS CONSISTENT WITH AN IRREVERSIBLE RESTRICTIVE PATTERN/  GRADE 4 DIASTOLIC DYSFUNCTION/ MILD TO MOD. MV REGUR./ BIL. ATRIUM MOD. DILATED/ RVSF MOD. REDUCED/ SMALL PERICARDIAL EFFUSION  . Cystoscopy 09/2011  . Tonsillectomy 10-23-11    child  . Cystoscopy with ureteroscopy 10/28/2011    Procedure: CYSTOSCOPY WITH URETEROSCOPY;  Surgeon: Milford Cage, MD;  Location: [facility];  Service: Urology;  Laterality: Right;    ROS:  As stated in the HPI and negative for all other systems.  PHYSICAL EXAM BP 90/60  Pulse 64  Ht 5\' 5"  (1.651 m)  Wt 140 lb (63.504 kg)  BMI 23.30 kg/m2  LMP 08/04/1991 PHYSICAL EXAM GEN:  No distress, frail appearing NECK:  Jugular venous distention at 90 degrees, waveform within normal limits, carotid upstroke brisk and symmetric, no bruits, no thyromegaly LUNGS:  Clear to auscultation bilaterally BACK:  No CVA tenderness CHEST:  Unremarkable HEART:  S1 and S2 within normal limits, no S3, no clicks, no rubs, no murmurs ABD:  Positive bowel sounds normal in frequency in pitch, no bruits, no rebound, no guarding, unable to assess midline mass or bruit with the patient seated. EXT:  2 plus pulses throughout, moderate edema unchanged from previous, no cyanosis no clubbing NEURO:  Diffuse weakness and muscle wasting nonfocal otherwise SKIN:  No rashes.  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  Her potassium was low at the last visit and I will repeat this today and supplement as needed.  I am going to give her the KDUR 20 meq tablets as I suspect that she will be needing a  higher dose.  I have encouraged her to follow up in [place] to see if there are any new protocols for treatment.    Atrial fibrillation  She remains on Coumadin. Rate is controlled.   Hypotension  She seems to tolerate this.  She will remain off beta blocker.

## 2012-07-07 NOTE — Patient Instructions (Addendum)
BMET today.  Take Zaroxolyn today and tomorrow.  We will call you with results of your labs and give you instructions on your potassium dose.

## 2012-07-08 ENCOUNTER — Other Ambulatory Visit: Payer: Self-pay | Admitting: *Deleted

## 2012-07-08 DIAGNOSIS — I5022 Chronic systolic (congestive) heart failure: Secondary | ICD-10-CM

## 2012-07-08 DIAGNOSIS — Z79899 Other long term (current) drug therapy: Secondary | ICD-10-CM

## 2012-07-11 ENCOUNTER — Telehealth: Payer: Self-pay | Admitting: Cardiology

## 2012-07-11 NOTE — Telephone Encounter (Signed)
New Problem:    Patient called in wanting to speak with you about her test results.  Please call back.

## 2012-07-11 NOTE — Telephone Encounter (Signed)
Reviewed results of lab with pt she will repeat lab work on Thursday. Of note - she is having dizziness and her BP has been 71/34 and 84/47.  Her wt is holding steady and her edema is minimal.  She will decrease her Demadex to 40 mg in the AM and 20 mg in the PM.  She will call back if blood pressures and dizziness do not improve or if wt or edema increases.

## 2012-07-14 ENCOUNTER — Other Ambulatory Visit (INDEPENDENT_AMBULATORY_CARE_PROVIDER_SITE_OTHER): Payer: Medicare Other

## 2012-07-14 ENCOUNTER — Telehealth: Payer: Self-pay

## 2012-07-14 DIAGNOSIS — I10 Essential (primary) hypertension: Secondary | ICD-10-CM

## 2012-07-14 DIAGNOSIS — I5022 Chronic systolic (congestive) heart failure: Secondary | ICD-10-CM

## 2012-07-14 DIAGNOSIS — Z79899 Other long term (current) drug therapy: Secondary | ICD-10-CM

## 2012-07-14 LAB — BASIC METABOLIC PANEL
CO2: 36 mEq/L — ABNORMAL HIGH (ref 19–32)
Chloride: 92 mEq/L — ABNORMAL LOW (ref 96–112)
Potassium: 3.4 mEq/L — ABNORMAL LOW (ref 3.5–5.1)

## 2012-07-14 NOTE — Telephone Encounter (Signed)
LMTCB.   Per Dr. Antoine Poche and due to lab results, Pt. should take addition 40 meq. of Potassium (1 time only) and repeat BMET in 2 weeks.

## 2012-07-15 NOTE — Telephone Encounter (Signed)
F/u    Pt calling back to speak with nurse, she can be reached at [phone]

## 2012-07-15 NOTE — Telephone Encounter (Signed)
Pt aware of orders and states understanding

## 2012-07-18 ENCOUNTER — Telehealth: Payer: Self-pay | Admitting: Cardiology

## 2012-07-18 NOTE — Telephone Encounter (Signed)
Ok to leave message, pt's weight increased 2lbs overnight, any changes to medication dosage

## 2012-07-18 NOTE — Telephone Encounter (Signed)
Called and left a message for pt to increase Demadex back to 40 mg in the am and 40 in the pm for 2 days as long as she is not having any dizziness.  She will continue to monitor her weight and call back if no improvement.

## 2012-07-19 NOTE — Telephone Encounter (Signed)
Spoke with pt who states wt held steady over night.  She feel like she has a greater increase in urine output today and that weight should return to normal by tomorrow.  She will continue to monitor wt and edema.  She will follow up with blood work as scheduled  She will call back if questions or concerns.

## 2012-07-27 ENCOUNTER — Ambulatory Visit (INDEPENDENT_AMBULATORY_CARE_PROVIDER_SITE_OTHER): Payer: Medicare Other | Admitting: Pharmacist

## 2012-07-27 ENCOUNTER — Other Ambulatory Visit (INDEPENDENT_AMBULATORY_CARE_PROVIDER_SITE_OTHER): Payer: Medicare Other

## 2012-07-27 DIAGNOSIS — I10 Essential (primary) hypertension: Secondary | ICD-10-CM

## 2012-07-27 DIAGNOSIS — Z7901 Long term (current) use of anticoagulants: Secondary | ICD-10-CM

## 2012-07-27 DIAGNOSIS — I498 Other specified cardiac arrhythmias: Secondary | ICD-10-CM

## 2012-07-27 LAB — BASIC METABOLIC PANEL
CO2: 35 mEq/L — ABNORMAL HIGH (ref 19–32)
Chloride: 94 mEq/L — ABNORMAL LOW (ref 96–112)
Glucose, Bld: 103 mg/dL — ABNORMAL HIGH (ref 70–99)
Sodium: 139 mEq/L (ref 135–145)

## 2012-07-27 LAB — POCT INR: INR: 2.3

## 2012-07-29 ENCOUNTER — Other Ambulatory Visit: Payer: Medicare Other

## 2012-08-04 ENCOUNTER — Ambulatory Visit (INDEPENDENT_AMBULATORY_CARE_PROVIDER_SITE_OTHER): Payer: Medicare Other | Admitting: *Deleted

## 2012-08-04 DIAGNOSIS — G63 Polyneuropathy in diseases classified elsewhere: Secondary | ICD-10-CM

## 2012-08-04 DIAGNOSIS — E538 Deficiency of other specified B group vitamins: Secondary | ICD-10-CM

## 2012-08-04 MED ORDER — CYANOCOBALAMIN 1000 MCG/ML IJ SOLN
1000.0000 ug | Freq: Once | INTRAMUSCULAR | Status: AC
  Administered 2012-08-04: 1000 ug via INTRAMUSCULAR

## 2012-08-08 ENCOUNTER — Other Ambulatory Visit (INDEPENDENT_AMBULATORY_CARE_PROVIDER_SITE_OTHER): Payer: Medicare Other

## 2012-08-08 DIAGNOSIS — I10 Essential (primary) hypertension: Secondary | ICD-10-CM

## 2012-08-08 LAB — BASIC METABOLIC PANEL
BUN: 35 mg/dL — ABNORMAL HIGH (ref 6–23)
CO2: 32 mEq/L (ref 19–32)
Chloride: 98 mEq/L (ref 96–112)
Glucose, Bld: 111 mg/dL — ABNORMAL HIGH (ref 70–99)
Potassium: 3.3 mEq/L — ABNORMAL LOW (ref 3.5–5.1)

## 2012-08-09 ENCOUNTER — Encounter: Payer: Self-pay | Admitting: Cardiology

## 2012-08-09 ENCOUNTER — Other Ambulatory Visit: Payer: Self-pay

## 2012-08-09 MED ORDER — POTASSIUM CHLORIDE ER 10 MEQ PO TBCR: 10.0000 meq | EXTENDED_RELEASE_TABLET | Freq: Every day | ORAL | Status: DC | PRN

## 2012-08-09 NOTE — Telephone Encounter (Signed)
..   Requested Prescriptions   Signed Prescriptions Disp Refills  . potassium chloride (K-DUR) 10 MEQ tablet 90 tablet 12    Sig: Take 1-3 tablets (10-30 mEq total) by mouth daily as needed. FLUID    Authorizing Provider: Rollene Rotunda    Ordering User: Christella Hartigan, Para Cossey Judie Petit

## 2012-08-09 NOTE — Telephone Encounter (Signed)
Reorder twice because it did not go through the first time. .. Requested Prescriptions   Signed Prescriptions Disp Refills  . potassium chloride (K-DUR) 10 MEQ tablet 90 tablet 12    Sig: Take 1-3 tablets (10-30 mEq total) by mouth daily as needed. FLUID    Authorizing Provider: Rollene Rotunda    Ordering User: Christella Hartigan, Pranika Finks Judie Petit

## 2012-08-10 ENCOUNTER — Telehealth: Payer: Self-pay | Admitting: Cardiology

## 2012-08-10 NOTE — Telephone Encounter (Signed)
HAD A LONG DISCUSSION WITH  PT, RE  LOW K  OF  3.3 .PT CURRENTLY TAKING   K 10 MEQ   4 TABS A DAY AND TORSEMIDE 40 MG  BID  PER PT DR DETARDING WANTS TO INCREASE TO 60 MG  BID  PT STATES DR DETARDING   WOULD LIKE TO DISCUSS WITH YOU IF POSSIBLE ALSO PT  WANTED  TO REMIND YOU OF HAVING  ENDOSCOPY ON FRI AT [facility]  WANTS TO KNOW IF MAY PROCEED WITH LOW K WILL FORWARD TO DR [facility]  FOR REVIEW .Zack Seal

## 2012-08-10 NOTE — Telephone Encounter (Signed)
She will stop the Zaroxolyn and increase Torsemide to 60 mg bid.  She will increase the Kdur to 30 meq bid.  She needs a BMET on Monday.  We need to get her labs from renal.

## 2012-08-10 NOTE — Telephone Encounter (Signed)
plz return call to pt on cell [phone] regarding medication change

## 2012-08-11 NOTE — Telephone Encounter (Signed)
Pt aware - Dr Antoine Poche had called pt. Pt will have Dr Deterding forward lab results to [place].

## 2012-08-15 ENCOUNTER — Other Ambulatory Visit: Payer: Self-pay | Admitting: *Deleted

## 2012-08-15 DIAGNOSIS — I10 Essential (primary) hypertension: Secondary | ICD-10-CM

## 2012-08-15 DIAGNOSIS — I2510 Atherosclerotic heart disease of native coronary artery without angina pectoris: Secondary | ICD-10-CM

## 2012-08-19 ENCOUNTER — Telehealth: Payer: Self-pay | Admitting: *Deleted

## 2012-08-19 ENCOUNTER — Other Ambulatory Visit (INDEPENDENT_AMBULATORY_CARE_PROVIDER_SITE_OTHER): Payer: Medicare Other

## 2012-08-19 ENCOUNTER — Ambulatory Visit (INDEPENDENT_AMBULATORY_CARE_PROVIDER_SITE_OTHER): Payer: Medicare Other | Admitting: *Deleted

## 2012-08-19 DIAGNOSIS — I498 Other specified cardiac arrhythmias: Secondary | ICD-10-CM

## 2012-08-19 DIAGNOSIS — I428 Other cardiomyopathies: Secondary | ICD-10-CM

## 2012-08-19 DIAGNOSIS — Z7901 Long term (current) use of anticoagulants: Secondary | ICD-10-CM

## 2012-08-19 DIAGNOSIS — I2510 Atherosclerotic heart disease of native coronary artery without angina pectoris: Secondary | ICD-10-CM

## 2012-08-19 DIAGNOSIS — I10 Essential (primary) hypertension: Secondary | ICD-10-CM

## 2012-08-19 LAB — BASIC METABOLIC PANEL
CO2: 28 mEq/L (ref 19–32)
Calcium: 9 mg/dL (ref 8.4–10.5)
Sodium: 133 mEq/L — ABNORMAL LOW (ref 135–145)

## 2012-08-19 LAB — POCT INR: INR: 3.5

## 2012-08-19 NOTE — Telephone Encounter (Signed)
I spoke with pt about results of labs drawn today. DOD Dr. Johney Frame reviewed and recommended pt stop Torsemide and potassium. Have repeat BMP on Monday 08/22/12.   Pt concerned she will start gaining fluid over the weekend. Discussed daily weight & to call for 2 pound weight gain. Aware there is oncall help to call our office. Reviewed diet ( pt was deciding between ham or broiled steak for dinner), she will try to keep feet elevated. She has company coming in. Reassurance given to pt.  She would like copy of labs mailed to her and sent to Dr. Darrick Penna.  Done. Mylo Red RN

## 2012-08-22 ENCOUNTER — Other Ambulatory Visit (INDEPENDENT_AMBULATORY_CARE_PROVIDER_SITE_OTHER): Payer: Medicare Other

## 2012-08-22 DIAGNOSIS — I428 Other cardiomyopathies: Secondary | ICD-10-CM

## 2012-08-22 LAB — BASIC METABOLIC PANEL
GFR: 37.33 mL/min — ABNORMAL LOW (ref >60.00)
Glucose, Bld: 82 mg/dL (ref 70–99)
Potassium: 3.8 mEq/L (ref 3.5–5.1)
Sodium: 137 mEq/L (ref 135–145)

## 2012-08-23 ENCOUNTER — Telehealth: Payer: Self-pay | Admitting: Cardiology

## 2012-08-23 NOTE — Telephone Encounter (Signed)
Reviewed lab results with pt.  She is going to restart her medications as ordered.  She is aware I will let her know when to repeat lab work once Dr Antoine Poche reviews.

## 2012-08-23 NOTE — Telephone Encounter (Signed)
New problem    Discuss lab work that was done on yesterday.

## 2012-08-29 ENCOUNTER — Other Ambulatory Visit: Payer: Self-pay | Admitting: *Deleted

## 2012-08-29 ENCOUNTER — Telehealth: Payer: Self-pay | Admitting: Cardiology

## 2012-08-29 DIAGNOSIS — E785 Hyperlipidemia, unspecified: Secondary | ICD-10-CM

## 2012-08-29 MED ORDER — ESOMEPRAZOLE MAGNESIUM 40 MG PO CPDR: 40.0000 mg | DELAYED_RELEASE_CAPSULE | Freq: Two times a day (BID) | ORAL | Status: DC

## 2012-08-29 NOTE — Telephone Encounter (Signed)
Walk in pt Form " pt Weight is Down pt has Questions about Dosage Sent to pam/Hochrein 08/29/12/KM

## 2012-08-30 ENCOUNTER — Other Ambulatory Visit: Payer: Self-pay

## 2012-08-30 MED ORDER — POTASSIUM CHLORIDE ER 10 MEQ PO TBCR: 10.0000 meq | EXTENDED_RELEASE_TABLET | Freq: Every day | ORAL | Status: DC | PRN

## 2012-08-30 NOTE — Telephone Encounter (Signed)
..   Requested Prescriptions   Signed Prescriptions Disp Refills  . potassium chloride (K-DUR) 10 MEQ tablet 180 tablet 12    Sig: Take 1-6 tablets (10-60 mEq total) by mouth daily as needed. FLUID    Authorizing Provider: Rollene Rotunda    Ordering User: Christella Hartigan, Ryszard Socarras Judie Petit

## 2012-09-02 ENCOUNTER — Ambulatory Visit (INDEPENDENT_AMBULATORY_CARE_PROVIDER_SITE_OTHER): Payer: Medicare Other | Admitting: *Deleted

## 2012-09-02 DIAGNOSIS — I498 Other specified cardiac arrhythmias: Secondary | ICD-10-CM

## 2012-09-02 DIAGNOSIS — Z7901 Long term (current) use of anticoagulants: Secondary | ICD-10-CM

## 2012-09-06 ENCOUNTER — Telehealth: Payer: Self-pay | Admitting: Cardiology

## 2012-09-06 ENCOUNTER — Ambulatory Visit (INDEPENDENT_AMBULATORY_CARE_PROVIDER_SITE_OTHER): Payer: 59 | Admitting: *Deleted

## 2012-09-06 DIAGNOSIS — G63 Polyneuropathy in diseases classified elsewhere: Secondary | ICD-10-CM

## 2012-09-06 DIAGNOSIS — E538 Deficiency of other specified B group vitamins: Secondary | ICD-10-CM

## 2012-09-06 MED ORDER — CYANOCOBALAMIN 1000 MCG/ML IJ SOLN
1000.0000 ug | Freq: Once | INTRAMUSCULAR | Status: AC
  Administered 2012-09-06: 1000 ug via INTRAMUSCULAR

## 2012-09-06 NOTE — Telephone Encounter (Signed)
Pt is scheduled for to be seen in the office 09/07/2012

## 2012-09-06 NOTE — Telephone Encounter (Signed)
Pt is waiting by the phone and she is very upset and concerned she wants to be seen tomorrow due to her weeping legs and the edema not getting better

## 2012-09-07 ENCOUNTER — Encounter: Payer: Self-pay | Admitting: Cardiology

## 2012-09-07 ENCOUNTER — Ambulatory Visit (INDEPENDENT_AMBULATORY_CARE_PROVIDER_SITE_OTHER): Payer: 59 | Admitting: Cardiology

## 2012-09-07 VITALS — BP 91/60 | HR 86 | Ht 65.0 in | Wt 133.0 lb

## 2012-09-07 DIAGNOSIS — I5022 Chronic systolic (congestive) heart failure: Secondary | ICD-10-CM

## 2012-09-07 DIAGNOSIS — Z79899 Other long term (current) drug therapy: Secondary | ICD-10-CM

## 2012-09-07 DIAGNOSIS — I2510 Atherosclerotic heart disease of native coronary artery without angina pectoris: Secondary | ICD-10-CM

## 2012-09-07 LAB — BASIC METABOLIC PANEL
BUN: 35 mg/dL — ABNORMAL HIGH (ref 6–23)
Chloride: 98 mEq/L (ref 96–112)
Potassium: 3.3 mEq/L — ABNORMAL LOW (ref 3.5–5.1)

## 2012-09-07 NOTE — Patient Instructions (Addendum)
Please take Demadex 60 mg a day . Take Zaroxolyn 2.5 mg for 2 days. Continue all other medications as listed  Please have blood work today  (BMP).  Keep feet and legs elevated above the level of your heart as much as possible.  Decrease the sodium in your diet as much as possible.  Follow up in the office in 1 week.

## 2012-09-07 NOTE — Progress Notes (Signed)
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   Today she has no shortness of breath. Her weights have been as low as 123 pounds and as high as 129. Recently she had some diarrhea. She related this to her potassium. She wanted to hold potassium and so she reduced her diuretic. Her weights have crept back up then she's had increased lower extremity swelling. In fact she has had some weeping lower extremity swelling where she scratched herself. She reports that she is finally adhering to a lower salt diet but that she does keep her feet down much more than I would like. She's not describing any new PND or orthopnea. She's not had any palpitations, presyncope or syncope.   Allergies  Allergen Reactions  . Statins Other (See Comments)    LEG CRAMPS     Current Outpatient Prescriptions  Medication Sig Dispense Refill  . Alpha-D-Galactosidase (BEANO PO) Take 1-2 tablets by mouth 2 (two) times daily as needed. GAS       . Calcium Citrate-Vitamin D (CITRACAL + D PO) Take 2 tablets by mouth daily.       . diflunisal (DOLOBID) 500 MG TABS Take 0.5 tablets (250 mg total) by mouth 2 (two) times daily.  60 tablet  6  . docusate sodium (COLACE) 100 MG capsule Take 100 mg by mouth as needed.       Marland Kitchen esomeprazole (NEXIUM) 40 MG capsule Take 1 capsule (40 mg total) by mouth 2 (two) times daily.  60 capsule  5  . ferrous sulfate 325 (65 FE) MG tablet Take 325 mg by mouth daily with breakfast.       . gabapentin (NEURONTIN) 100 MG capsule Take 1-3 capsules (100-300 mg total) by mouth 3 (three) times daily.  270 capsule  11  . Hypromellose (GENTEAL) 0.3 % SOLN Place 1 drop into both eyes 2 (two) times daily as needed. DRY EYES       . loperamide (IMODIUM) 2 MG capsule Take 2 mg by mouth 2 (two) times daily.       . metolazone (ZAROXOLYN) 2.5 MG tablet Take 1 tablet (2.5 mg total) by mouth daily.  30 tablet  1  . Multiple Vitamins-Minerals (MULTIVITAMINS THER. W/MINERALS) TABS Take 1  tablet by mouth daily.       . Nutritional Supplements (CARNATION INSTANT BREAKFAST PO) Take 1 Package by mouth 2 (two) times daily.        . potassium chloride (K-DUR) 10 MEQ tablet Take 1-6 tablets (10-60 mEq total) by mouth daily as needed. FLUID  180 tablet  12  . Probiotic Product (ALIGN PO) Take 1 capsule by mouth daily.       . Simethicone 180 MG CAPS Take 1-2 capsules by mouth 2 (two) times daily as needed. GAS       . torsemide (DEMADEX) 20 MG tablet Take 2 tablets (40 mg total) by mouth 2 (two) times daily.  120 tablet  11  . warfarin (COUMADIN) 2.5 MG tablet Take as directed by coumadin clinic.  40 tablet  3    Past Medical History  Diagnosis Date  . Lactose intolerance   . IBS (irritable bowel syndrome)   . Dyslipidemia   . Hypertension   . 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 Kidney stone     R ureteral stone  . Coronary artery disease   .  S/P hemorrhoidectomy   . Arthritis   . Carotid stenosis, bilateral PER DUPLEX  04-10-2011    RICA  40-59%/  LICA 60-79%  . Cardiomyopathy secondary EF 30%  . Atrial fibrillation     chronic anticoag  . Hypercholesterolemia   . Spinal stenosis   . Neuromuscular disorder     neuropathy d/t amyloidosis  . ANXIETY   . DEPRESSION   . Branch retinal vein occlusion of left eye     12/2011 event - vision improving - Following with optho and retinal specialist for same    Past Surgical History  Procedure Date  . Hemorrhoid surgery 2002  . Carpal tunnel release 2005    RIGHT  . Cysto/ bladder bx and fulgeration 02-21-2007  . Right sural nerve bx/ right gastrocemius muscle bx 09-25-2009  . Breast biopsy 1992  . Appendectomy 1961  . Extracorporeal shock wave lithotripsy 08-10-2011    RIGHT  . Transthoracic echocardiogram 11-24-2010    LVEF 35% WITH DIFFUSE HYPOKINESIS, WORSE IN THE BASE/MID ANTERIOR WALL AND LATERAL WALL/ BASAL INFERIOR/ INFEROSEPTAL  AKINESIS/  CAVITY SIZE NORMAL/ WALL THICKNESS WAS INCREASED IN A PATTERN OF SEVERE LVH/ PAPAMETERS CONSISTENT WITH AN IRREVERSIBLE RESTRICTIVE PATTERN/  GRADE 4 DIASTOLIC DYSFUNCTION/ MILD TO MOD. MV REGUR./ BIL. ATRIUM MOD. DILATED/ RVSF MOD. REDUCED/ SMALL PERICARDIAL EFFUSION  . Cystoscopy 09/2011  . Tonsillectomy 10-23-11    child  . Cystoscopy with ureteroscopy 10/28/2011    Procedure: CYSTOSCOPY WITH URETEROSCOPY;  Surgeon: Milford Cage, MD;  Location: [facility];  Service: Urology;  Laterality: Right;    ROS:  As stated in the HPI and negative for all other systems.  PHYSICAL EXAM BP 91/60  Pulse 86  Ht 5\' 5"  (1.651 m)  Wt 133 lb (60.328 kg)  BMI 22.13 kg/m2  LMP 08/04/1991 PHYSICAL EXAM GEN:  No distress, frail appearing NECK:  Jugular venous distention at 90 degrees, waveform within normal limits, carotid upstroke brisk and symmetric, no bruits, no thyromegaly LUNGS:  Clear to auscultation bilaterally BACK:  No CVA tenderness CHEST:  Unremarkable HEART:  S1 and S2 within normal limits, no S3, no clicks, no rubs, no murmurs,, irregular ABD:  Positive bowel sounds normal in frequency in pitch, no bruits, no rebound, no guarding, unable to assess midline mass or bruit with the patient seated. EXT:  2 plus pulses throughout,  severe bilateral edema unchanged from previous, no cyanosis no clubbing NEURO:  Diffuse weakness and muscle wasting nonfocal otherwise SKIN:  No rashes.  EKG:  Atrial fibrillation, RBBB, RAD, rate 85.  No significant change.  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 The next step will probably be hospitalization. She's going to be taking the 60 mg twice a day and Demadex and Zaroxolyn 2.5 mg for the next 2 days. The Demadex will continue at the 60 mg twice a day.  I will get a basic  metabolic profile today and she'll need also be seen in one week.  Atrial fibrillation  She remains on Coumadin. Rate is controlled.   Hypotension  She seems to tolerate this.  She will remain off beta blocker.  CKD We are watching this closely as above.

## 2012-09-14 ENCOUNTER — Emergency Department (HOSPITAL_COMMUNITY): Payer: Medicare Other

## 2012-09-14 ENCOUNTER — Emergency Department (HOSPITAL_COMMUNITY)
Admission: EM | Admit: 2012-09-14 | Discharge: 2012-09-14 | Disposition: A | Discharge status: Home / Self Care | Payer: Medicare Other | Attending: Emergency Medicine | Admitting: Emergency Medicine

## 2012-09-14 ENCOUNTER — Encounter (HOSPITAL_COMMUNITY): Payer: Self-pay | Admitting: Emergency Medicine

## 2012-09-14 DIAGNOSIS — I10 Essential (primary) hypertension: Secondary | ICD-10-CM | POA: Insufficient documentation

## 2012-09-14 DIAGNOSIS — S0993XA Unspecified injury of face, initial encounter: Secondary | ICD-10-CM | POA: Insufficient documentation

## 2012-09-14 DIAGNOSIS — S298XXA Other specified injuries of thorax, initial encounter: Secondary | ICD-10-CM | POA: Insufficient documentation

## 2012-09-14 DIAGNOSIS — Z79899 Other long term (current) drug therapy: Secondary | ICD-10-CM | POA: Insufficient documentation

## 2012-09-14 DIAGNOSIS — S0990XA Unspecified injury of head, initial encounter: Secondary | ICD-10-CM | POA: Insufficient documentation

## 2012-09-14 DIAGNOSIS — I2510 Atherosclerotic heart disease of native coronary artery without angina pectoris: Secondary | ICD-10-CM | POA: Insufficient documentation

## 2012-09-14 DIAGNOSIS — W010XXA Fall on same level from slipping, tripping and stumbling without subsequent striking against object, initial encounter: Secondary | ICD-10-CM | POA: Insufficient documentation

## 2012-09-14 DIAGNOSIS — Z87442 Personal history of urinary calculi: Secondary | ICD-10-CM | POA: Insufficient documentation

## 2012-09-14 DIAGNOSIS — W1809XA Striking against other object with subsequent fall, initial encounter: Secondary | ICD-10-CM | POA: Insufficient documentation

## 2012-09-14 DIAGNOSIS — Z8679 Personal history of other diseases of the circulatory system: Secondary | ICD-10-CM | POA: Insufficient documentation

## 2012-09-14 DIAGNOSIS — Z8739 Personal history of other diseases of the musculoskeletal system and connective tissue: Secondary | ICD-10-CM | POA: Insufficient documentation

## 2012-09-14 DIAGNOSIS — Z8659 Personal history of other mental and behavioral disorders: Secondary | ICD-10-CM | POA: Insufficient documentation

## 2012-09-14 DIAGNOSIS — Y939 Activity, unspecified: Secondary | ICD-10-CM | POA: Insufficient documentation

## 2012-09-14 DIAGNOSIS — Z8719 Personal history of other diseases of the digestive system: Secondary | ICD-10-CM | POA: Insufficient documentation

## 2012-09-14 DIAGNOSIS — Z8639 Personal history of other endocrine, nutritional and metabolic disease: Secondary | ICD-10-CM | POA: Insufficient documentation

## 2012-09-14 DIAGNOSIS — Z87891 Personal history of nicotine dependence: Secondary | ICD-10-CM | POA: Insufficient documentation

## 2012-09-14 DIAGNOSIS — Z7901 Long term (current) use of anticoagulants: Secondary | ICD-10-CM | POA: Insufficient documentation

## 2012-09-14 DIAGNOSIS — Y929 Unspecified place or not applicable: Secondary | ICD-10-CM | POA: Insufficient documentation

## 2012-09-14 DIAGNOSIS — S199XXA Unspecified injury of neck, initial encounter: Secondary | ICD-10-CM | POA: Insufficient documentation

## 2012-09-14 DIAGNOSIS — Z862 Personal history of diseases of the blood and blood-forming organs and certain disorders involving the immune mechanism: Secondary | ICD-10-CM | POA: Insufficient documentation

## 2012-09-14 DIAGNOSIS — Z8669 Personal history of other diseases of the nervous system and sense organs: Secondary | ICD-10-CM | POA: Insufficient documentation

## 2012-09-14 LAB — COMPREHENSIVE METABOLIC PANEL
ALT: 13 U/L (ref 0–35)
Alkaline Phosphatase: 63 U/L (ref 39–117)
CO2: 34 mEq/L — ABNORMAL HIGH (ref 19–32)
Chloride: 94 mEq/L — ABNORMAL LOW (ref 96–112)
GFR calc Af Amer: 47 mL/min — ABNORMAL LOW (ref >90)
GFR calc non Af Amer: 41 mL/min — ABNORMAL LOW (ref >90)
Glucose, Bld: 102 mg/dL — ABNORMAL HIGH (ref 70–99)
Potassium: 3.1 mEq/L — ABNORMAL LOW (ref 3.5–5.1)
Sodium: 140 mEq/L (ref 135–145)
Total Bilirubin: 0.8 mg/dL (ref 0.3–1.2)
Total Protein: 6.6 g/dL (ref 6.0–8.3)

## 2012-09-14 LAB — CBC WITH DIFFERENTIAL/PLATELET
Hemoglobin: 14.8 g/dL (ref 12.0–15.0)
Lymphocytes Relative: 20 % (ref 12–46)
Lymphs Abs: 1.4 10*3/uL (ref 0.7–4.0)
Neutro Abs: 5.3 10*3/uL (ref 1.7–7.7)
Neutrophils Relative %: 75 % (ref 43–77)
Platelets: 159 10*3/uL (ref 150–400)
RBC: 4.57 MIL/uL (ref 3.87–5.11)
WBC: 7.1 10*3/uL (ref 4.0–10.5)

## 2012-09-14 MED ORDER — TRAMADOL HCL 50 MG PO TABS: 50.0000 mg | ORAL_TABLET | Freq: Four times a day (QID) | ORAL | Status: DC | PRN

## 2012-09-14 MED ORDER — HYDROMORPHONE HCL PF 1 MG/ML IJ SOLN
0.5000 mg | INTRAMUSCULAR | Status: DC | PRN
  Administered 2012-09-14: 0.5 mg via INTRAVENOUS
  Filled 2012-09-14: qty 1

## 2012-09-14 NOTE — ED Notes (Addendum)
Pt arrives via EMS from home where she fell on ice, hitting head.  Complains of head, neck pain, left rib cage.

## 2012-09-14 NOTE — ED Notes (Signed)
Pt transported to radiology.

## 2012-09-14 NOTE — ED Provider Notes (Signed)
History     CSN: [id number]  Arrival date & time 09/14/12  1205   First MD Initiated Contact with Patient 09/14/12 1206      Chief Complaint  Patient presents with  . Fall    (Consider location/radiation/quality/duration/timing/severity/associated sxs/prior treatment) HPI The patient presents immediately after a fall with pain in her head, neck, left anterior chest wall.  She states that she slipped while going down a cement step, fell backwards, striking the back of her head and neck against the pavement.  She sustained a laceration, but no loss of consciousness.  Syncopal event she said pain persistently in the aforementioned areas.  The pain is nonradiating, sore, worse with motion or palpation.  No confusion, disorientation, visual changes. She is multiple medical problems, including CAD, CHF, amyloidosis.  She states that she has been compliant with all medication recently. Past Medical History  Diagnosis Date  . Lactose intolerance   . IBS (irritable bowel syndrome)   . Dyslipidemia   . Hypertension   . 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 Kidney stone     R ureteral stone  . Coronary artery disease   . S/P hemorrhoidectomy   . Arthritis   . Carotid stenosis, bilateral PER DUPLEX  04-10-2011    RICA  40-59%/  LICA 60-79%  . Cardiomyopathy secondary EF 30%  . Atrial fibrillation     chronic anticoag  . Hypercholesterolemia   . Spinal stenosis   . Neuromuscular disorder     neuropathy d/t amyloidosis  . ANXIETY   . DEPRESSION   . Branch retinal vein occlusion of left eye     12/2011 event - vision improving - Following with optho and retinal specialist for same    Past Surgical History  Procedure Date  . Hemorrhoid surgery 2002  . Carpal tunnel release 2005    RIGHT  . Cysto/ bladder bx and fulgeration 02-21-2007  . Right sural nerve bx/ right gastrocemius muscle bx 09-25-2009  .  Breast biopsy 1992  . Appendectomy 1961  . Extracorporeal shock wave lithotripsy 08-10-2011    RIGHT  . Transthoracic echocardiogram 11-24-2010    LVEF 35% WITH DIFFUSE HYPOKINESIS, WORSE IN THE BASE/MID ANTERIOR WALL AND LATERAL WALL/ BASAL INFERIOR/ INFEROSEPTAL AKINESIS/  CAVITY SIZE NORMAL/ WALL THICKNESS WAS INCREASED IN A PATTERN OF SEVERE LVH/ PAPAMETERS CONSISTENT WITH AN IRREVERSIBLE RESTRICTIVE PATTERN/  GRADE 4 DIASTOLIC DYSFUNCTION/ MILD TO MOD. MV REGUR./ BIL. ATRIUM MOD. DILATED/ RVSF MOD. REDUCED/ SMALL PERICARDIAL EFFUSION  . Cystoscopy 09/2011  . Tonsillectomy 10-23-11    child  . Cystoscopy with ureteroscopy 10/28/2011    Procedure: CYSTOSCOPY WITH URETEROSCOPY;  Surgeon: Milford Cage, MD;  Location: [facility];  Service: Urology;  Laterality: Right;    Family History  Problem Relation Age of Onset  . Heart disease Mother   . Arthritis Mother   . Arthritis Father   . COPD Other     History  Substance Use Topics  . Smoking status: Former Smoker -- 20 years    Types: Cigarettes    Quit date: 10/28/1990  . Smokeless tobacco: Never Used  . Alcohol Use: No    OB History    Grav Para Term Preterm Abortions TAB SAB Ect Mult Living                  Review of Systems  Constitutional:       Per  HPI, otherwise negative  HENT:       Per HPI, otherwise negative  Eyes: Negative.   Respiratory:       Per HPI, otherwise negative  Cardiovascular:       Per HPI, otherwise negative  Gastrointestinal: Negative for vomiting.  Genitourinary: Negative.   Musculoskeletal:       Per HPI, otherwise negative  Skin: Negative.   Neurological: Negative for syncope.    Allergies  Statins  Home Medications   Current Outpatient Rx  Name  Route  Sig  Dispense  Refill  . BEANO PO   Oral   Take 1-2 tablets by mouth 2 (two) times daily as needed. GAS          . CITRACAL + D PO   Oral   Take 2 tablets by mouth daily.          Marland Kitchen DIFLUNISAL 500 MG PO TABS   Oral    Take 0.5 tablets (250 mg total) by mouth 2 (two) times daily.   60 tablet   6   . DOCUSATE SODIUM 100 MG PO CAPS   Oral   Take 100 mg by mouth as needed.          Marland Kitchen ESOMEPRAZOLE MAGNESIUM 40 MG PO CPDR   Oral   Take 1 capsule (40 mg total) by mouth 2 (two) times daily.   60 capsule   5   . FERROUS SULFATE 325 (65 FE) MG PO TABS   Oral   Take 325 mg by mouth daily with breakfast.          . GABAPENTIN 100 MG PO CAPS   Oral   Take 1-3 capsules (100-300 mg total) by mouth 3 (three) times daily.   270 capsule   11   . HYPROMELLOSE 0.3 % OP SOLN   Both Eyes   Place 1 drop into both eyes 2 (two) times daily as needed. DRY EYES          . LOPERAMIDE HCL 2 MG PO CAPS   Oral   Take 2 mg by mouth 2 (two) times daily.          Marland Kitchen METOLAZONE 2.5 MG PO TABS   Oral   Take 1 tablet (2.5 mg total) by mouth daily.   30 tablet   1   . THERA M PLUS PO TABS   Oral   Take 1 tablet by mouth daily.          Marland Kitchen CARNATION INSTANT BREAKFAST PO   Oral   Take 1 Package by mouth 2 (two) times daily.           Marland Kitchen POTASSIUM CHLORIDE ER 10 MEQ PO TBCR   Oral   Take 1-6 tablets (10-60 mEq total) by mouth daily as needed. FLUID   180 tablet   12   . ALIGN PO   Oral   Take 1 capsule by mouth daily.          Marland Kitchen SIMETHICONE 180 MG PO CAPS   Oral   Take 1-2 capsules by mouth 2 (two) times daily as needed. GAS          . TORSEMIDE 20 MG PO TABS   Oral   Take 2 tablets (40 mg total) by mouth 2 (two) times daily.   120 tablet   11   . WARFARIN SODIUM 2.5 MG PO TABS      Take as directed by coumadin clinic.   40  tablet   3     SpO2 94%  LMP 08/04/1991  Physical Exam  Nursing note and vitals reviewed. Constitutional: She is oriented to person, place, and time. She appears well-developed and well-nourished. No distress. Cervical collar and backboard in place.    HENT:  Head: Normocephalic and atraumatic.  Eyes: Conjunctivae normal and EOM are normal.    Cardiovascular: Normal rate and regular rhythm.   Pulmonary/Chest: Effort normal and breath sounds normal. No stridor. No respiratory distress.    Abdominal: She exhibits no distension.  Musculoskeletal: She exhibits no edema.  Neurological: She is alert and oriented to person, place, and time. No cranial nerve deficit.  Skin: Skin is warm and dry.  Psychiatric: She has a normal mood and affect.    ED Course  Procedures (including critical care time)   Labs Reviewed  CBC WITH DIFFERENTIAL  COMPREHENSIVE METABOLIC PANEL  PROTIME-INR   No results found.   No diagnosis found.  2:58 PM Patient now c/o low back / coccyx pain. Pelvis is stable.  She can elevate each leg, w minimal discomfort.  Occipital wound reevaluated.    MDM  This elderly F on coumadin now presents after a mechanical fall w head pain, neck pain and eventually low back pain.  On exam she was in no distress, speaking appropriately, interacting consistently.  Labs, her direct studies were reassuring, and the patient was discharged in stable condition.        Gerhard Munch, MD 09/14/12 [phone]

## 2012-09-14 NOTE — ED Notes (Signed)
Pt returned from radiology.

## 2012-09-14 NOTE — ED Notes (Signed)
Pt sent back to xray at this time.

## 2012-09-14 NOTE — ED Notes (Signed)
Son at the bedside. c-collar removed per MD order. Back of head cleansed to degree to determine if staples are needed. Will continue to finish cleaning the back of patient's head with dried blood off.

## 2012-09-16 ENCOUNTER — Encounter: Payer: Self-pay | Admitting: Cardiology

## 2012-09-16 ENCOUNTER — Ambulatory Visit (INDEPENDENT_AMBULATORY_CARE_PROVIDER_SITE_OTHER): Payer: 59 | Admitting: Cardiology

## 2012-09-16 ENCOUNTER — Other Ambulatory Visit (INDEPENDENT_AMBULATORY_CARE_PROVIDER_SITE_OTHER): Payer: 59

## 2012-09-16 ENCOUNTER — Ambulatory Visit (INDEPENDENT_AMBULATORY_CARE_PROVIDER_SITE_OTHER): Payer: 59 | Admitting: *Deleted

## 2012-09-16 VITALS — BP 102/64 | HR 82 | Ht 65.0 in | Wt 123.0 lb

## 2012-09-16 DIAGNOSIS — Z7901 Long term (current) use of anticoagulants: Secondary | ICD-10-CM

## 2012-09-16 DIAGNOSIS — R0989 Other specified symptoms and signs involving the circulatory and respiratory systems: Secondary | ICD-10-CM

## 2012-09-16 DIAGNOSIS — I428 Other cardiomyopathies: Secondary | ICD-10-CM

## 2012-09-16 DIAGNOSIS — I10 Essential (primary) hypertension: Secondary | ICD-10-CM

## 2012-09-16 DIAGNOSIS — I498 Other specified cardiac arrhythmias: Secondary | ICD-10-CM

## 2012-09-16 DIAGNOSIS — I6529 Occlusion and stenosis of unspecified carotid artery: Secondary | ICD-10-CM

## 2012-09-16 DIAGNOSIS — I2510 Atherosclerotic heart disease of native coronary artery without angina pectoris: Secondary | ICD-10-CM

## 2012-09-16 LAB — BASIC METABOLIC PANEL
BUN: 45 mg/dL — ABNORMAL HIGH (ref 6–23)
GFR: 44.23 mL/min — ABNORMAL LOW (ref >60.00)
Glucose, Bld: 92 mg/dL (ref 70–99)
Potassium: 2.9 mEq/L — ABNORMAL LOW (ref 3.5–5.1)

## 2012-09-16 NOTE — Patient Instructions (Addendum)
Please to extra KCL 60 MEQ for 2 days All other medications remain the same.  Please have blood work today (INR and BMP)  Follow up with Dr Antoine Poche in 1 month.

## 2012-09-16 NOTE — Progress Notes (Signed)
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  Since I last saw her she had a fall.  She was seen in the ER and a head CT was negative for any acute bleeds.  Her weight is actually down 10 pounds. She hasn't had any severe presyncope or syncope. She had a mechanical fall on ice. She denies any bleeding issues. She's had no shortness of breath, PND or orthopnea. She might be slightly better with her salt and she seems to be keeping her feet elevated more. She's not having any new chest pressure, neck or arm discomfort.  Allergies  Allergen Reactions  . Statins Other (See Comments)    LEG CRAMPS     Current Outpatient Prescriptions  Medication Sig Dispense Refill  . Alpha-D-Galactosidase (BEANO PO) Take 1-2 tablets by mouth 2 (two) times daily as needed. GAS       . diflunisal (DOLOBID) 500 MG TABS Take 0.5 tablets (250 mg total) by mouth 2 (two) times daily.  60 tablet  6  . docusate sodium (COLACE) 100 MG capsule Take 100 mg by mouth as needed. For stool softner      . esomeprazole (NEXIUM) 40 MG capsule Take 1 capsule (40 mg total) by mouth 2 (two) times daily.  60 capsule  5  . gabapentin (NEURONTIN) 100 MG capsule Take 1-3 capsules (100-300 mg total) by mouth 3 (three) times daily.  270 capsule  11  . Hypromellose (GENTEAL) 0.3 % SOLN Place 1 drop into both eyes 2 (two) times daily as needed. DRY EYES       . loperamide (IMODIUM) 2 MG capsule Take 2 mg by mouth 2 (two) times daily.       . metolazone (ZAROXOLYN) 2.5 MG tablet Take 2.5 mg by mouth daily as needed. For fluid      . Multiple Vitamins-Minerals (MULTIVITAMINS THER. W/MINERALS) TABS Take 1 tablet by mouth daily.       . Nutritional Supplements (CARNATION INSTANT BREAKFAST PO) Take 1 Package by mouth 2 (two) times daily.        . potassium chloride (K-DUR) 10 MEQ tablet Take 1-6 tablets (10-60 mEq total) by mouth daily as needed. FLUID  180 tablet  12  . potassium chloride (K-DUR) 10 MEQ tablet Take 10-60  mEq by mouth daily as needed. For fluid. Take with for every 20mg  torsemide      . Probiotic Product (ALIGN PO) Take 1 capsule by mouth daily.       . Simethicone 180 MG CAPS Take 1-2 capsules by mouth 2 (two) times daily as needed. GAS       . torsemide (DEMADEX) 20 MG tablet Take 2 tablets (40 mg total) by mouth 2 (two) times daily.  120 tablet  11  . torsemide (DEMADEX) 20 MG tablet Take 40-60 mg by mouth 2 (two) times daily. Take with potassium. For every 20mg  torsemide take potassium      . warfarin (COUMADIN) 2.5 MG tablet Take 1.25-2.5 mg by mouth daily. Take 1 tablet daily except take 1/2 tablet on monday        Past Medical History  Diagnosis Date  . Lactose intolerance   . IBS (irritable bowel syndrome)   . Dyslipidemia   . Hypertension   . 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 Kidney stone     R ureteral stone  . Coronary  artery disease   . S/P hemorrhoidectomy   . Arthritis   . Carotid stenosis, bilateral PER DUPLEX  04-10-2011    RICA  40-59%/  LICA 60-79%  . Cardiomyopathy secondary EF 30%  . Atrial fibrillation     chronic anticoag  . Hypercholesterolemia   . Spinal stenosis   . Neuromuscular disorder     neuropathy d/t amyloidosis  . ANXIETY   . DEPRESSION   . Branch retinal vein occlusion of left eye     12/2011 event - vision improving - Following with optho and retinal specialist for same    Past Surgical History  Procedure Date  . Hemorrhoid surgery 2002  . Carpal tunnel release 2005    RIGHT  . Cysto/ bladder bx and fulgeration 02-21-2007  . Right sural nerve bx/ right gastrocemius muscle bx 09-25-2009  . Breast biopsy 1992  . Appendectomy 1961  . Extracorporeal shock wave lithotripsy 08-10-2011    RIGHT  . Transthoracic echocardiogram 11-24-2010    LVEF 35% WITH DIFFUSE HYPOKINESIS, WORSE IN THE BASE/MID ANTERIOR WALL AND LATERAL WALL/ BASAL INFERIOR/  INFEROSEPTAL AKINESIS/  CAVITY SIZE NORMAL/ WALL THICKNESS WAS INCREASED IN A PATTERN OF SEVERE LVH/ PAPAMETERS CONSISTENT WITH AN IRREVERSIBLE RESTRICTIVE PATTERN/  GRADE 4 DIASTOLIC DYSFUNCTION/ MILD TO MOD. MV REGUR./ BIL. ATRIUM MOD. DILATED/ RVSF MOD. REDUCED/ SMALL PERICARDIAL EFFUSION  . Cystoscopy 09/2011  . Tonsillectomy 10-23-11    child  . Cystoscopy with ureteroscopy 10/28/2011    Procedure: CYSTOSCOPY WITH URETEROSCOPY;  Surgeon: Milford Cage, MD;  Location: [facility];  Service: Urology;  Laterality: Right;    ROS:  As stated in the HPI and negative for all other systems.  PHYSICAL EXAM BP 102/64  Pulse 82  Ht 5\' 5"  (1.651 m)  Wt 123 lb (55.792 kg)  BMI 20.47 kg/m2  LMP 08/04/1991 PHYSICAL EXAM GEN:  No distress, frail appearing, there is bruising on the back of her neck NECK:  Jugular venous distention at 90 degrees, waveform within normal limits, carotid upstroke brisk and symmetric, no bruits, no thyromegaly LUNGS:  Clear to auscultation bilaterally BACK:  No CVA tenderness CHEST:  Unremarkable HEART:  S1 and S2 within normal limits, no S3, no clicks, no rubs, no murmurs,, irregular ABD:  Positive bowel sounds normal in frequency in pitch, no bruits, no rebound, no guarding, unable to assess midline mass or bruit with the patient seated. EXT:  2 plus pulses throughout,  severe bilateral edema reduced from previous, no cyanosis no clubbing NEURO:  Diffuse weakness and muscle wasting nonfocal otherwise SKIN:  No rashes.  EKG:  Atrial fibrillation, RBBB, RAD, rate 85.  No significant change.  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 Her potassium was low in the ER. I'm going to actually give her an extra 60 mEq for the next 2 days. We will watch her potassium closely. She's keeping her feet elevated more than she was but we reviewed this again today. I have encouraged  her to keep a followup appointment in [place] to see if there are any experimental protocols for which she might be eligible.  Atrial fibrillation  I will check her INR today. Given her recent head trauma will try to keep this on the low therapeutic range.  Hypotension  Her blood pressure is actually reasonably good today. She will continue on the meds as listed.  CKD We will follow her renal function  closely.  Fall This was a mechanical fall on ice. I am planning on repeating a head CT in the next 6-8 weeks and we discussed any symptoms that could indicate subdural hematoma which could occur late after trauma. For now however she and I both agree with her risk of stroke we will continue anticoagulation.

## 2012-09-20 ENCOUNTER — Ambulatory Visit (INDEPENDENT_AMBULATORY_CARE_PROVIDER_SITE_OTHER): Payer: 59 | Admitting: Internal Medicine

## 2012-09-20 ENCOUNTER — Other Ambulatory Visit (INDEPENDENT_AMBULATORY_CARE_PROVIDER_SITE_OTHER): Payer: 59

## 2012-09-20 ENCOUNTER — Encounter: Payer: Self-pay | Admitting: Internal Medicine

## 2012-09-20 ENCOUNTER — Telehealth: Payer: Self-pay | Admitting: Cardiology

## 2012-09-20 VITALS — BP 88/52 | HR 85 | Temp 97.4°F | Ht 65.0 in | Wt 125.4 lb

## 2012-09-20 DIAGNOSIS — S1093XA Contusion of unspecified part of neck, initial encounter: Secondary | ICD-10-CM

## 2012-09-20 DIAGNOSIS — R300 Dysuria: Secondary | ICD-10-CM

## 2012-09-20 DIAGNOSIS — S0003XA Contusion of scalp, initial encounter: Secondary | ICD-10-CM

## 2012-09-20 DIAGNOSIS — E8589 Other amyloidosis: Secondary | ICD-10-CM

## 2012-09-20 DIAGNOSIS — N058 Unspecified nephritic syndrome with other morphologic changes: Secondary | ICD-10-CM

## 2012-09-20 DIAGNOSIS — E852 Heredofamilial amyloidosis, unspecified: Secondary | ICD-10-CM

## 2012-09-20 DIAGNOSIS — E46 Unspecified protein-calorie malnutrition: Secondary | ICD-10-CM

## 2012-09-20 LAB — URINALYSIS, ROUTINE W REFLEX MICROSCOPIC
Ketones, ur: NEGATIVE
Specific Gravity, Urine: 1.015 (ref 1.000–1.030)
Urine Glucose: NEGATIVE
Urobilinogen, UA: 0.2 (ref 0.0–1.0)

## 2012-09-20 MED ORDER — WARFARIN SODIUM 2.5 MG PO TABS: ORAL_TABLET | ORAL | Status: DC

## 2012-09-20 MED ORDER — CIPROFLOXACIN HCL 250 MG PO TABS: 250.0000 mg | ORAL_TABLET | Freq: Two times a day (BID) | ORAL | Status: DC

## 2012-09-20 NOTE — Telephone Encounter (Signed)
Called pt and she states she has a dose of coumadin for today and this nurse instructed her that she is sending refill in now so she will be able to have coumadin for tomorrow

## 2012-09-20 NOTE — Progress Notes (Signed)
Subjective:    Patient ID: Miranda [name], female    DOB: 15-Apr-1941, 72 y.o.   MRN: [id number]  HPI here for ER follow up - seen 09/14/12 due to accidental slip on wet pavement at home Also reviewed interval medical events and work with cardiology on same  Also reviewed chronic medical issues today:  familial amyloidosis - dx in 09/2009 after extensive neuro eval locally and [facility] - nerve and muscle bx 09/2009 confirmed amyloid and genetic testin 01/2010 confirmed AD dz - s/p specialty eval at [facility] 03/2010 - using off label diflunisal for same - complicated by CM (since 2008), periph neuropathy with sensorimotor deficits, autonomic dysfx of GI including satiety and "IBS-like" alternating bowels  Atrial fibrillation - related to CM - on chronic anticoag with [facility] for same  B12 deficiency - diagnosed by neurology during workup in 2009 - prior IM shots at PCP/neuro -resumed same may 2013  Cardiomyopathy - dx 2008 - denies increase/change in edema, dyspnea on exertion or syncope - no PND - the patient reports compliance with medication(s) as prescribed. Denies adverse side effects. Works with cards regularly on same  Past Medical History  Diagnosis Date  . Lactose intolerance   . IBS (irritable bowel syndrome)   . Dyslipidemia   . Hypertension   . 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 Kidney stone     R ureteral stone  . Coronary artery disease   . S/P hemorrhoidectomy   . Arthritis   . Carotid stenosis, bilateral PER DUPLEX  04-10-2011    RICA  40-59%/  LICA 60-79%  . Cardiomyopathy secondary EF 30%  . Atrial fibrillation     chronic anticoag  . Hypercholesterolemia   . Spinal stenosis   . Neuromuscular disorder     neuropathy d/t amyloidosis  . ANXIETY   . DEPRESSION   . Branch retinal vein occlusion of left eye     12/2011 event - vision improving - Following with optho and retinal  specialist for same    Review of Systems  Constitutional: Negative for fever or weight change.  Respiratory: Negative for cough and shortness of breath.   Cardiovascular: Negative for chest pain or palpitations.  GU: complains of dysuria, ?UTI     Objective:   Physical Exam  BP 88/52  Pulse 85  Temp 97.4 F (36.3 C) (Oral)  Ht 5\' 5"  (1.651 m)  Wt 125 lb 6.4 oz (56.881 kg)  BMI 20.87 kg/m2  SpO2 97%  LMP 08/04/1991 Wt Readings from Last 3 Encounters:  09/20/12 125 lb 6.4 oz (56.881 kg)  09/16/12 123 lb (55.792 kg)  09/07/12 133 lb (60.328 kg)   Constitutional: She is thin, but appears well-developed and well-nourished. No distress. Uses RW Neck: Normal range of motion. Neck supple. No JVD or LAD present. No thyromegaly present.  Cardiovascular: Irregular rate and rhythm and normal heart sounds.  No murmur heard. 2+ BLE edema, wearing compression hose. Pulmonary/Chest: Effort normal and breath sounds normal. No respiratory distress. She has no wheezes.  Neurological: She is alert and oriented to person, place, and time. No cranial nerve deficit. Coordination slow but normal (baseline).  Skin: Bruising L scalp with quarter size hematoma over L occipital region, no laceration or ulceration,  with subcutaneous drift of blood down L posterior and lateral side of neck - remaining skin is warm and dry. No rash noted. No erythema.  Neurological: AAOx4, CN2-12 symmetrically intact - speech fluent, MAE well, balance at baseline Psychiatric: She has a normal mood and affect. Her behavior is normal. Judgment and thought content normal.   Lab Results  Component Value Date   WBC 7.1 09/14/2012   HGB 14.8 09/14/2012   HCT 43.0 09/14/2012   PLT 159 09/14/2012   GLUCOSE 92 09/16/2012   CHOL 174 06/08/2012   TRIG 78.0 06/08/2012   HDL 59.80 06/08/2012   LDLCALC 99 06/08/2012   ALT 13 09/14/2012   AST 27 09/14/2012   NA 139 09/16/2012   K 2.9* 09/16/2012   CL 92* 09/16/2012   CREATININE 1.3*  09/16/2012   BUN 45* 09/16/2012   CO2 37* 09/16/2012   INR 3.0 09/16/2012   Dg Chest 2 View  09/14/2012  *RADIOLOGY REPORT*  Clinical Data: Fall, left-sided chest pain.  CHEST - 2 VIEW  Comparison: 10/19/2011  Findings: Cardiomegaly.  Blunting of the costophrenic angles bilaterally which may represent small effusions.  This is similar to prior study.  No focal airspace opacity.  No acute bony abnormality.  No visible rib fracture.  No pneumothorax.  IMPRESSION: Cardiomegaly.  Suspect small bilateral effusions.   Original Report Authenticated By: Charlett Nose, M.D.    Dg Lumbar Spine Complete  09/14/2012  *RADIOLOGY REPORT*  Clinical Data: Fall, low back pain.  LUMBAR SPINE - COMPLETE 4+ VIEW  Comparison: 10/19/2011  Findings: Levoscoliosis in the lumbar spine.  Degenerative disc disease and facet disease throughout the lumbar spine.  No fracture or subluxation.  SI joints are symmetric and unremarkable.  IMPRESSION: Levoscoliosis and spondylosis.  No acute findings.   Original Report Authenticated By: Charlett Nose, M.D.    Dg Sacrum/coccyx  09/14/2012  *RADIOLOGY REPORT*  Clinical Data: Fall, pain.  SACRUM AND COCCYX - 2+ VIEW  Comparison: CT 10/09/2005  Findings: SI joints are symmetric and unremarkable.  Moderate degenerative changes in the lower lumbar spine and hips bilaterally.  No acute bony abnormality.  No visualized sacral or coccygeal fracture.  IMPRESSION: No acute bony abnormality.   Original Report Authenticated By: Charlett Nose, M.D.    Ct Head Wo Contrast  09/14/2012  *RADIOLOGY REPORT*  Clinical Data:  Fall  CT HEAD WITHOUT CONTRAST CT CERVICAL SPINE WITHOUT CONTRAST  Technique:  Multidetector CT imaging of the head and cervical spine was performed following the standard protocol without intravenous contrast.  Multiplanar CT image reconstructions of the cervical spine were also generated.  Comparison:  06/07/2008  CT HEAD  Findings: No skull fracture is noted.  Paranasal sinuses and mastoid  air cells are unremarkable.  No intracranial hemorrhage, mass effect or midline shift.  Ventricular size is stable from prior exam.  Mild cerebral atrophy.  No acute infarction.  No mass lesion is noted on this unenhanced scan.  IMPRESSION: No acute intracranial abnormality.  CT CERVICAL SPINE  Findings: Axial images of the cervical spine shows no acute fracture or subluxation.  There is no pneumothorax in visualized lung apices.  Air is noted in the upper esophagus.  Computer processed images shows no acute fracture or subluxation.  There is disc space flattening with mild anterior spurring and mild posterior disc bulge at C3-C4 level.  Disc space flattening with mild anterior and mild posterior spurring and mild disc bulge at C5- C6 and C6-C7 level.  No prevertebral soft tissue swelling. Cervical airway is patent.  Mild emphysematous changes are noted in the right apex.  IMPRESSION: No acute fracture or subluxation.  Degenerative  changes as described above.   Original Report Authenticated By: Natasha Mead, M.D.       Assessment & Plan:  See problem list. Medications and labs reviewed today.  Head trauma with scalp hematoma following accidental fall 09/14/12 - ER eval reviewed in depth- reassurance provided  Dysuria - check UA now, prefers Cipro as Septra causes side effects  Addendum- consistent with UTI - 7d Cipro erx done  ?malnutrition - will refer to nutrition at pt request  Time spent with pt today 35 minutes, greater than 50% time spent counseling patient on recent accidental fall and head trauma, familial amyloidosis, and autonomic/periph neuropathy symptoms mgmt with medication review. Also review of prior records

## 2012-09-20 NOTE — Telephone Encounter (Signed)
New Problem:    Patient called in needing a refill of her warfarin (COUMADIN) 2.5 MG tablet.  Patient is completely out.

## 2012-09-20 NOTE — Assessment & Plan Note (Signed)
Ongoing off label use diflunisal for same HX again reviewed today including complications and progression of disease Increase gabapentin for GI symptoms and neuropathy - see "IBS" above regarding pending evaluation at tertiary center [facility]) No other tx changes recommended -  To follow up with local neuro (?[facility] study upcoming) and Randell Loop for other tx needs as they arise

## 2012-09-20 NOTE — Patient Instructions (Signed)
It was good to see you today. We have reviewed your interval records including labs and tests today Medications reviewed, no changes at this time.  Test(s) ordered today - urine check- Your results will be released to MyChart (or called to you) after review, usually within 72hours after test completion. If any changes need to be made, you will be notified at that same time. we'll make referral to nutritionist for weight changes Our office will contact you regarding appointment(s) once made. Please schedule followup in 6 months, call sooner if problems.

## 2012-09-21 ENCOUNTER — Telehealth: Payer: Self-pay | Admitting: *Deleted

## 2012-09-21 NOTE — Telephone Encounter (Signed)
Patient call into coumadin clinic to let us know that Dr Felicity Coyer put her on CIPRO  250 mg po bid for 7 days FOR UTI, also patient states she had a fall 09/14/2012, she was scanned, Dr Antoine Poche wants pts INR on lower end due to fall, 09/16/2012 INR was a 3.0, I have adjusted coumadin dose due to cipro may increase INR and also MD wanting INR on lower end of range, instructed dose coumadin 1/29 --2.5 mg,  1/30--1.25mg ,  1/31--1.25mg ,  2/1--2.5mg  2/2--1.25 mg Appointment on 09/26/2012 for INR check

## 2012-09-26 ENCOUNTER — Ambulatory Visit (INDEPENDENT_AMBULATORY_CARE_PROVIDER_SITE_OTHER): Payer: 59 | Admitting: Pharmacist

## 2012-09-26 ENCOUNTER — Ambulatory Visit (INDEPENDENT_AMBULATORY_CARE_PROVIDER_SITE_OTHER): Payer: 59 | Admitting: *Deleted

## 2012-09-26 ENCOUNTER — Telehealth: Payer: Self-pay | Admitting: *Deleted

## 2012-09-26 DIAGNOSIS — I10 Essential (primary) hypertension: Secondary | ICD-10-CM

## 2012-09-26 DIAGNOSIS — I498 Other specified cardiac arrhythmias: Secondary | ICD-10-CM

## 2012-09-26 DIAGNOSIS — Z7901 Long term (current) use of anticoagulants: Secondary | ICD-10-CM

## 2012-09-26 LAB — POCT INR: INR: 1.6

## 2012-09-26 NOTE — Telephone Encounter (Signed)
Called pt no answer LMOM md response...lmb 

## 2012-09-26 NOTE — Telephone Encounter (Signed)
No, not unelss recurrent symptoms or problems

## 2012-09-26 NOTE — Telephone Encounter (Signed)
Left msg on vm stating will be finish cipro tomorrow want to know does she need to come back in to have urine re-check...Raechel Chute

## 2012-09-27 LAB — BASIC METABOLIC PANEL
Calcium: 8.7 mg/dL (ref 8.4–10.5)
Chloride: 98 mEq/L (ref 96–112)
Creatinine, Ser: 1.5 mg/dL — ABNORMAL HIGH (ref 0.4–1.2)
GFR: 35.35 mL/min — ABNORMAL LOW (ref >60.00)

## 2012-10-04 ENCOUNTER — Ambulatory Visit (INDEPENDENT_AMBULATORY_CARE_PROVIDER_SITE_OTHER): Payer: 59 | Admitting: *Deleted

## 2012-10-04 ENCOUNTER — Ambulatory Visit (INDEPENDENT_AMBULATORY_CARE_PROVIDER_SITE_OTHER): Payer: 59

## 2012-10-04 DIAGNOSIS — I2510 Atherosclerotic heart disease of native coronary artery without angina pectoris: Secondary | ICD-10-CM

## 2012-10-04 DIAGNOSIS — Z7901 Long term (current) use of anticoagulants: Secondary | ICD-10-CM

## 2012-10-04 DIAGNOSIS — I10 Essential (primary) hypertension: Secondary | ICD-10-CM

## 2012-10-04 DIAGNOSIS — I498 Other specified cardiac arrhythmias: Secondary | ICD-10-CM

## 2012-10-04 LAB — BASIC METABOLIC PANEL
Calcium: 8.6 mg/dL (ref 8.4–10.5)
GFR: 43.43 mL/min — ABNORMAL LOW (ref >60.00)
Sodium: 138 mEq/L (ref 135–145)

## 2012-10-04 LAB — POCT INR: INR: 1.8

## 2012-10-05 ENCOUNTER — Other Ambulatory Visit: Payer: 59

## 2012-10-05 ENCOUNTER — Ambulatory Visit: Payer: 59

## 2012-10-08 IMAGING — CR DG CHEST 2V
2 series · 2 of 2 positions shown · non-contrast
Comparison: 09/23/2009

CLINICAL DATA: Preop chest

CHEST - 2 VIEW

[w chest lat]
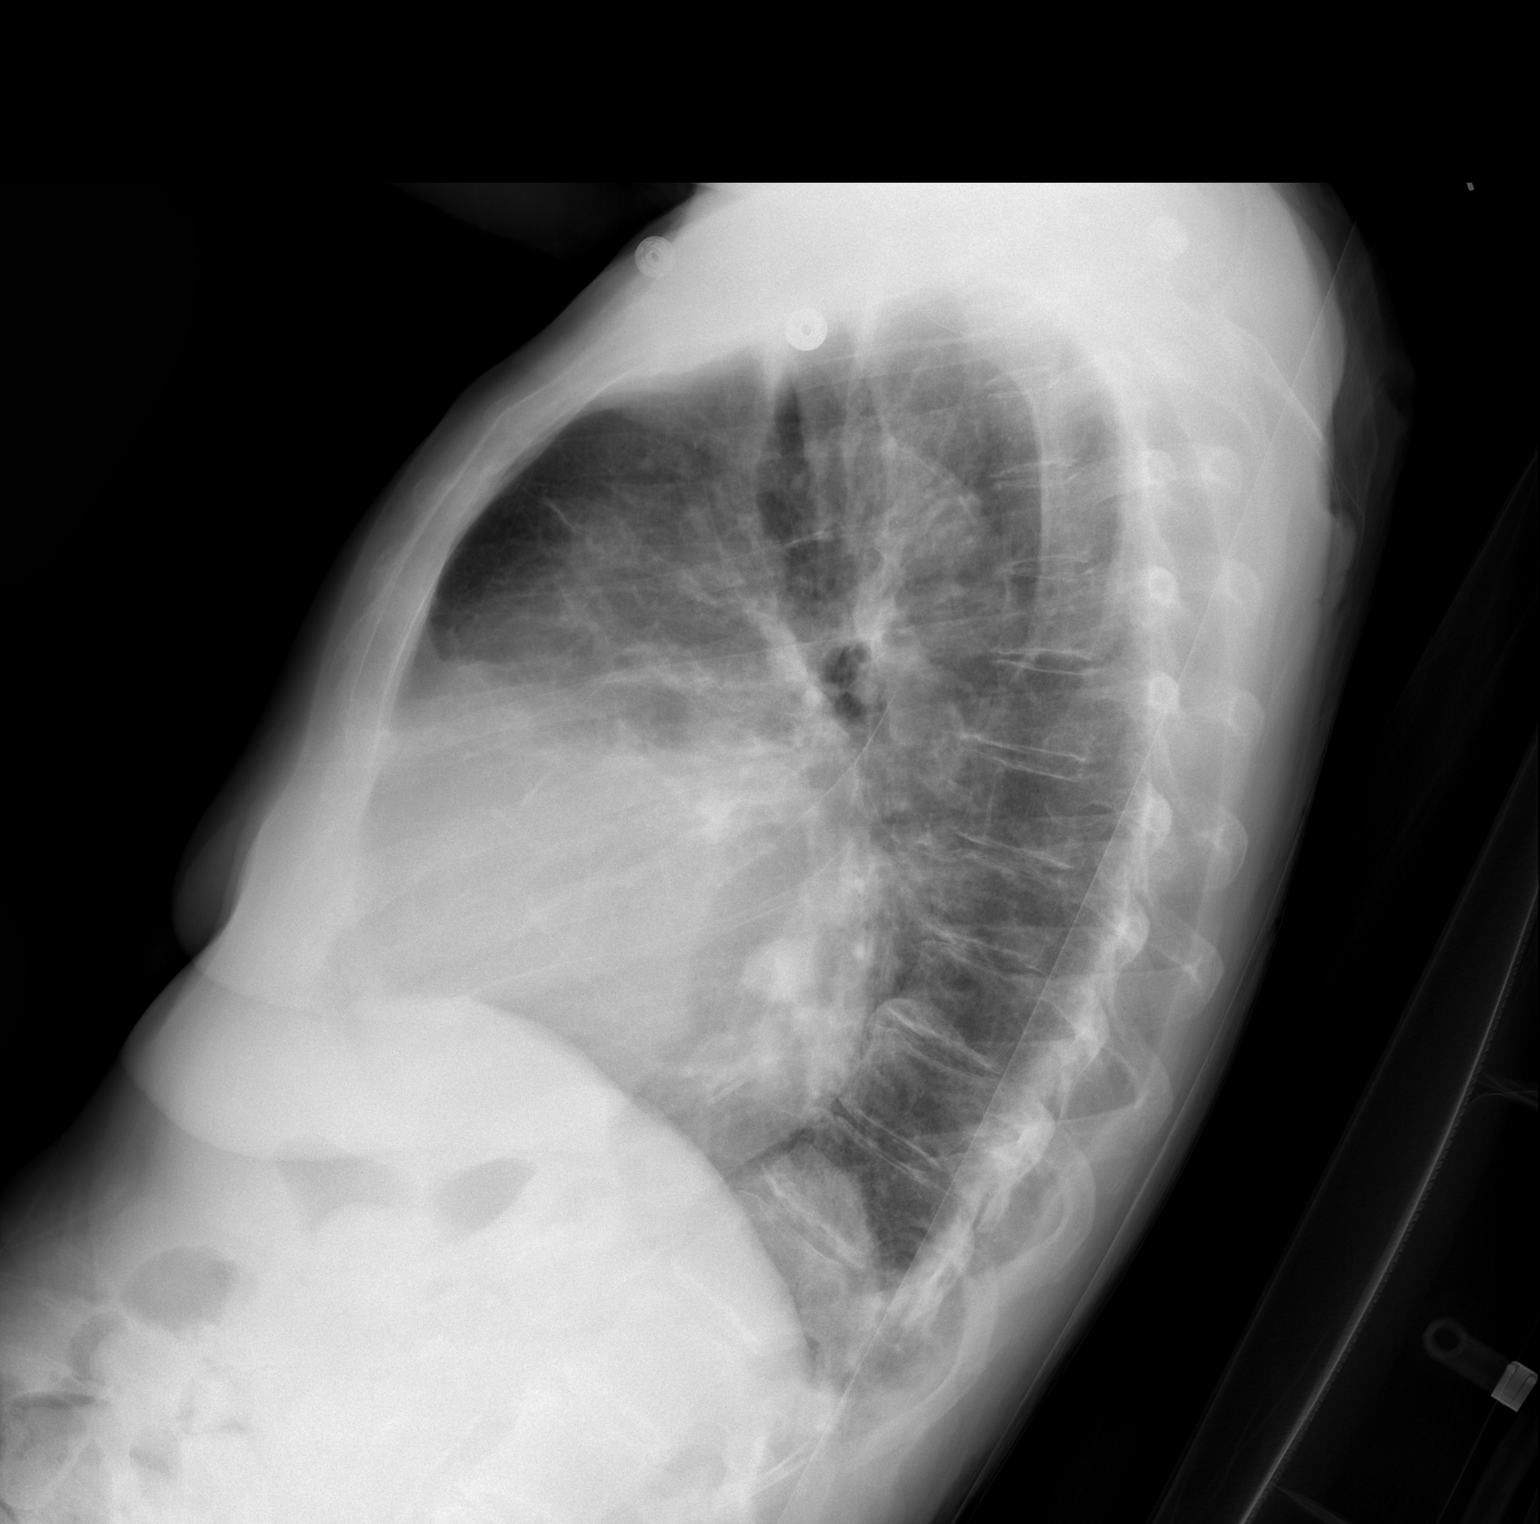

[view not recorded]
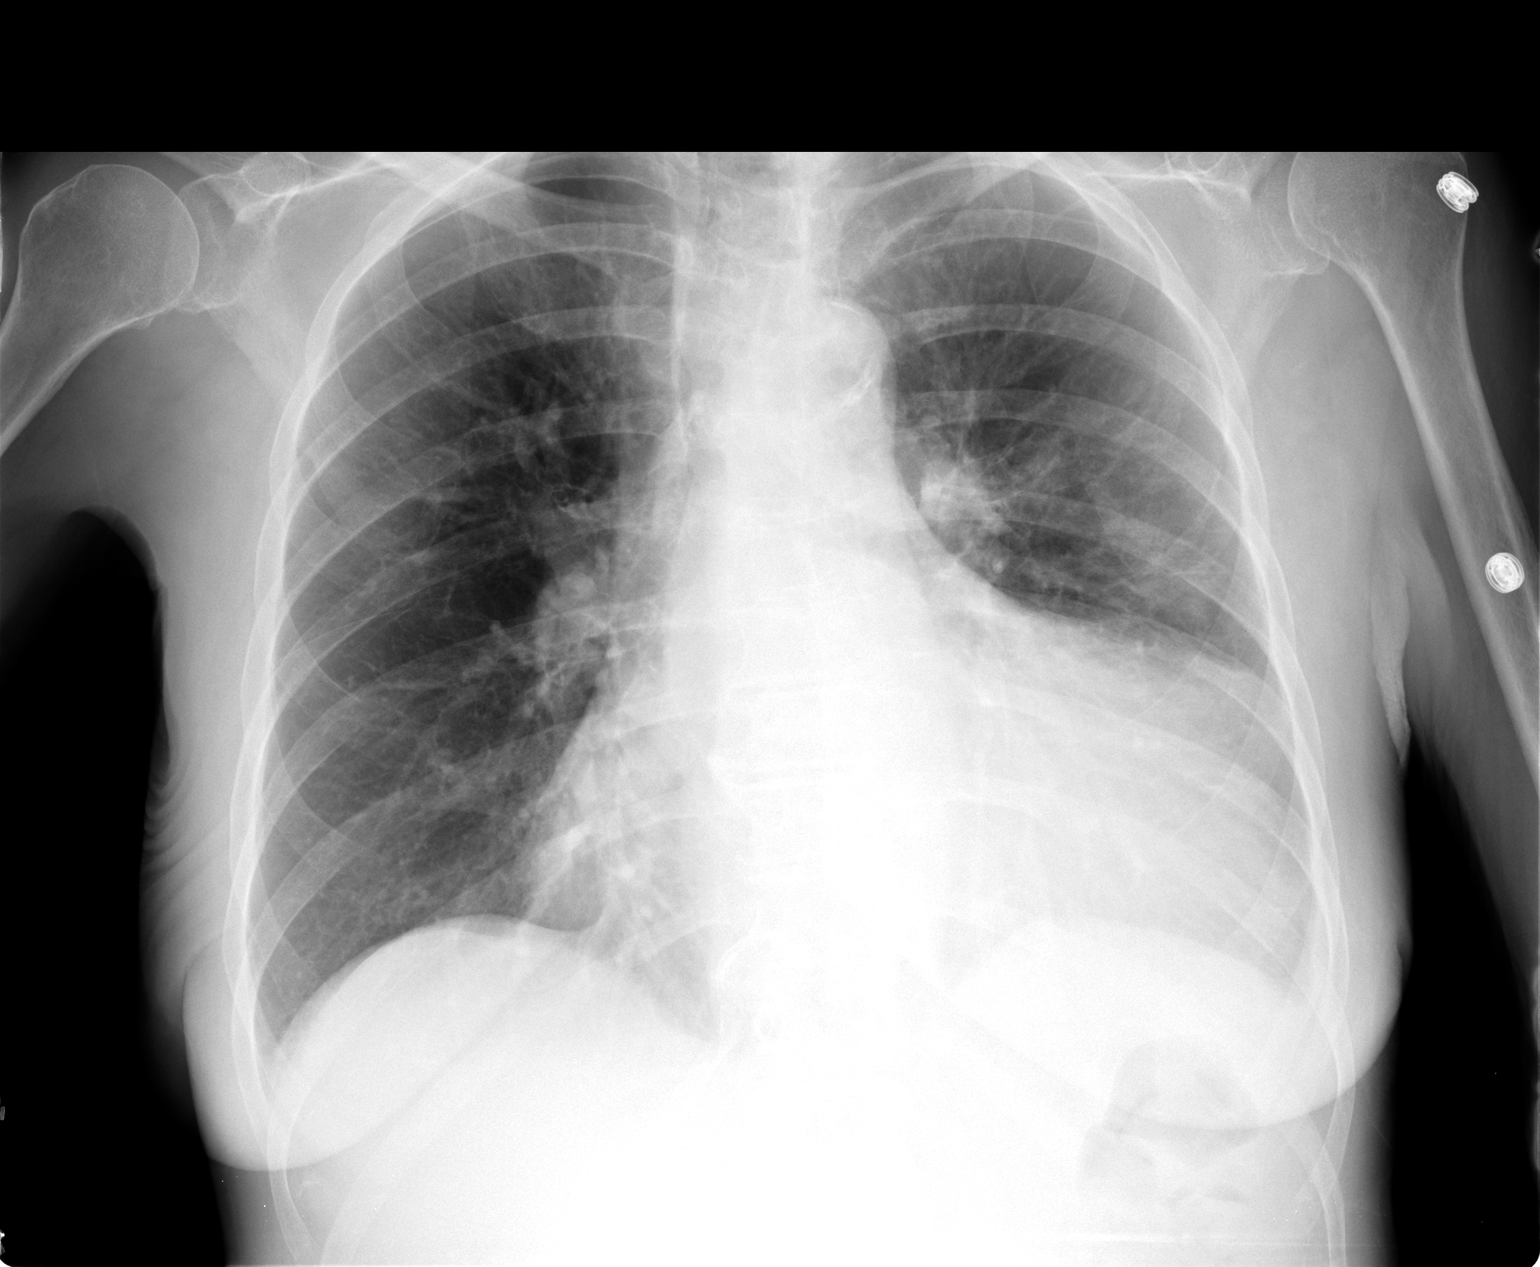

[2 of 2 positions shown; findings below may reference images not displayed]

FINDINGS: Chronic interstitial markings.  Mild with patchy opacity
in the lingula, possibly scarring. No pleural effusion or
pneumothorax.

Stable mild cardiomegaly.

Degenerative changes of the visualized thoracolumbar spine.
IMPRESSION: Chronic interstitial markings.  Mild patchy opacity in the lingula,
possibly scarring.

Stable mild cardiomegaly.

## 2012-10-10 ENCOUNTER — Telehealth: Payer: Self-pay | Admitting: Cardiology

## 2012-10-10 MED ORDER — TORSEMIDE 20 MG PO TABS: ORAL_TABLET | ORAL | Status: DC

## 2012-10-10 NOTE — Telephone Encounter (Signed)
New Problem    Pt requesting new prescription for torsemide. Pt would like 180 supply.

## 2012-10-10 NOTE — Telephone Encounter (Signed)
Prescription refill send to pt's pharmacy as requested.

## 2012-10-12 ENCOUNTER — Ambulatory Visit (INDEPENDENT_AMBULATORY_CARE_PROVIDER_SITE_OTHER): Payer: 59 | Admitting: *Deleted

## 2012-10-12 DIAGNOSIS — E538 Deficiency of other specified B group vitamins: Secondary | ICD-10-CM

## 2012-10-12 DIAGNOSIS — G63 Polyneuropathy in diseases classified elsewhere: Secondary | ICD-10-CM

## 2012-10-12 MED ORDER — CYANOCOBALAMIN 1000 MCG/ML IJ SOLN
1000.0000 ug | Freq: Once | INTRAMUSCULAR | Status: AC
  Administered 2012-10-12: 1000 ug via INTRAMUSCULAR

## 2012-10-20 ENCOUNTER — Ambulatory Visit (INDEPENDENT_AMBULATORY_CARE_PROVIDER_SITE_OTHER): Payer: 59

## 2012-10-20 ENCOUNTER — Ambulatory Visit (INDEPENDENT_AMBULATORY_CARE_PROVIDER_SITE_OTHER): Payer: 59 | Admitting: Cardiology

## 2012-10-20 ENCOUNTER — Encounter: Payer: Self-pay | Admitting: Cardiology

## 2012-10-20 VITALS — BP 102/62 | HR 86 | Ht 64.0 in | Wt 129.8 lb

## 2012-10-20 DIAGNOSIS — W19XXXA Unspecified fall, initial encounter: Secondary | ICD-10-CM

## 2012-10-20 DIAGNOSIS — I429 Cardiomyopathy, unspecified: Secondary | ICD-10-CM

## 2012-10-20 DIAGNOSIS — Y92009 Unspecified place in unspecified non-institutional (private) residence as the place of occurrence of the external cause: Secondary | ICD-10-CM

## 2012-10-20 DIAGNOSIS — Z7901 Long term (current) use of anticoagulants: Secondary | ICD-10-CM

## 2012-10-20 DIAGNOSIS — I498 Other specified cardiac arrhythmias: Secondary | ICD-10-CM

## 2012-10-20 DIAGNOSIS — S81009A Unspecified open wound, unspecified knee, initial encounter: Secondary | ICD-10-CM

## 2012-10-20 DIAGNOSIS — S81802A Unspecified open wound, left lower leg, initial encounter: Secondary | ICD-10-CM

## 2012-10-20 DIAGNOSIS — Z79899 Other long term (current) drug therapy: Secondary | ICD-10-CM

## 2012-10-20 LAB — POCT INR: INR: 2.1

## 2012-10-20 MED ORDER — METOLAZONE 2.5 MG PO TABS: 2.5000 mg | ORAL_TABLET | Freq: Every day | ORAL | Status: DC | PRN

## 2012-10-20 NOTE — Patient Instructions (Addendum)
The current medical regimen is effective;  continue present plan and medications.  Please return for blood work in 2 weeks (BMP)  (CT of Head due to fall)  Non-Cardiac CT scanning, (CAT scanning), is a noninvasive, special x-ray that produces cross-sectional images of the body using x-rays and a computer. CT scans help physicians diagnose and treat medical conditions. For some CT exams, a contrast material is used to enhance visibility in the area of the body being studied. CT scans provide greater clarity and reveal more details than regular x-ray exams.  Please schedule an appointment with the [facility] for evaluation and treatment of leg wound.  Follow up with Dr Antoine Poche in 1 month.

## 2012-10-20 NOTE — Progress Notes (Signed)
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  Since I last saw her she has done well.  She has had predominantly stable weight. She's only required Zaroxolyn a couple of times since the last saw her. She's trying to keep her feet elevated more and limits so. She hasn't had any severe presyncope or syncope. She's had no shortness of breath, PND or orthopnea. She's not having any new chest pressure, neck or arm discomfort. She's had no new dizziness visual disturbances or other neurologic findings since she fell on the ice earlier this year. We have been following her renal function which has been good.   Allergies  Allergen Reactions  . Statins Other (See Comments)    LEG CRAMPS     Current Outpatient Prescriptions  Medication Sig Dispense Refill  . Alpha-D-Galactosidase (BEANO PO) Take 1-2 tablets by mouth 2 (two) times daily as needed. GAS       . ciprofloxacin (CIPRO) 250 MG tablet Take 1 tablet (250 mg total) by mouth 2 (two) times daily.  14 tablet  0  . diflunisal (DOLOBID) 500 MG TABS Take 0.5 tablets (250 mg total) by mouth 2 (two) times daily.  60 tablet  6  . docusate sodium (COLACE) 100 MG capsule Take 100 mg by mouth as needed. For stool softner      . esomeprazole (NEXIUM) 40 MG capsule Take 1 capsule (40 mg total) by mouth 2 (two) times daily.  60 capsule  5  . gabapentin (NEURONTIN) 100 MG capsule Take 1-3 capsules (100-300 mg total) by mouth 3 (three) times daily.  270 capsule  11  . Hypromellose (GENTEAL) 0.3 % SOLN Place 1 drop into both eyes 2 (two) times daily as needed. DRY EYES       . loperamide (IMODIUM) 2 MG capsule Take 2 mg by mouth 2 (two) times daily.       . metolazone (ZAROXOLYN) 2.5 MG tablet Take 2.5 mg by mouth daily as needed. For fluid      . Multiple Vitamins-Minerals (MULTIVITAMINS THER. W/MINERALS) TABS Take 1 tablet by mouth daily.       . Nutritional Supplements (CARNATION INSTANT BREAKFAST PO) Take 1 Package by mouth 2 (two)  times daily.        . potassium chloride (K-DUR) 10 MEQ tablet Take 10-60 mEq by mouth daily as needed. For fluid. Take with for every 20mg  torsemide      . Probiotic Product (ALIGN PO) Take 1 capsule by mouth daily.       . Simethicone 180 MG CAPS Take 1-2 capsules by mouth 2 (two) times daily as needed. GAS       . torsemide (DEMADEX) 20 MG tablet Pt to take 40 mg two to time a day pt to take the third dose as needed  180 tablet  11  . warfarin (COUMADIN) 2.5 MG tablet Take as directed by coumadin clinic  30 tablet  3   No current facility-administered medications for this visit.    Past Medical History  Diagnosis Date  . Lactose intolerance   . IBS (irritable bowel syndrome)   . Dyslipidemia   . Hypertension   . 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 Kidney stone     R ureteral stone  . Coronary artery disease   . S/P hemorrhoidectomy   . Arthritis   . Carotid stenosis, bilateral  PER DUPLEX  04-10-2011    RICA  40-59%/  LICA 60-79%  . Cardiomyopathy secondary EF 30%  . Atrial fibrillation     chronic anticoag  . Hypercholesterolemia   . Spinal stenosis   . Neuromuscular disorder     neuropathy d/t amyloidosis  . ANXIETY   . DEPRESSION   . Branch retinal vein occlusion of left eye     12/2011 event - vision improving - Following with optho and retinal specialist for same    Past Surgical History  Procedure Laterality Date  . Hemorrhoid surgery  2002  . Carpal tunnel release  2005    RIGHT  . Cysto/ bladder bx and fulgeration  02-21-2007  . Right sural nerve bx/ right gastrocemius muscle bx  09-25-2009  . Breast biopsy  1992  . Appendectomy  1961  . Extracorporeal shock wave lithotripsy  08-10-2011    RIGHT  . Transthoracic echocardiogram  11-24-2010    LVEF 35% WITH DIFFUSE HYPOKINESIS, WORSE IN THE BASE/MID ANTERIOR WALL AND LATERAL WALL/ BASAL INFERIOR/ INFEROSEPTAL AKINESIS/   CAVITY SIZE NORMAL/ WALL THICKNESS WAS INCREASED IN A PATTERN OF SEVERE LVH/ PAPAMETERS CONSISTENT WITH AN IRREVERSIBLE RESTRICTIVE PATTERN/  GRADE 4 DIASTOLIC DYSFUNCTION/ MILD TO MOD. MV REGUR./ BIL. ATRIUM MOD. DILATED/ RVSF MOD. REDUCED/ SMALL PERICARDIAL EFFUSION  . Cystoscopy  09/2011  . Tonsillectomy  10-23-11    child  . Cystoscopy with ureteroscopy  10/28/2011    Procedure: CYSTOSCOPY WITH URETEROSCOPY;  Surgeon: Milford Cage, MD;  Location: [facility];  Service: Urology;  Laterality: Right;    ROS:  As stated in the HPI and negative for all other systems.  PHYSICAL EXAM BP 102/62  Pulse 86  Ht 5\' 4"  (1.626 m)  Wt 129 lb 12.8 oz (58.877 kg)  BMI 22.27 kg/m2  LMP 08/04/1991 PHYSICAL EXAM GEN:  No distress, frail appearing, there is bruising on the back of her neck NECK:  Jugular venous distention at 90 degrees, waveform within normal limits, carotid upstroke brisk and symmetric, no bruits, no thyromegaly LUNGS:  Clear to auscultation bilaterally BACK:  No CVA tenderness CHEST:  Unremarkable HEART:  S1 and S2 within normal limits, no S3, no clicks, no rubs, no murmurs,, irregular ABD:  Positive bowel sounds normal in frequency in pitch, no bruits, no rebound, no guarding, unable to assess midline mass or bruit with the patient seated. EXT:  2 plus pulses throughout,  Moderate bilateral edema in her calfs reduced from previous, no cyanosis no clubbing NEURO:  Diffuse weakness and muscle wasting nonfocal otherwise SKIN:  No rashes, ulcer on the anterior aspect of the left lower leg  EKG:  Atrial fibrillation, RBBB, RAD, rate 85.  No significant change.  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 She is sleeping her feet elevated a little bit better. She will continue to regimen.  Atrial fibrillation  The  patient  tolerates this rhythm and rate control and anticoagulation. We will continue with the meds as listed.  Hypotension  Her blood pressure is actually reasonably good again today. She will continue on the meds as listed.  CKD We will follow her renal function closely and check a BMET in two weeks.  Fall This was a mechanical fall on ice. I am planning on repeating a head CT in mid March.  She is not demonstrating  any symptoms consistent with chronic subdural hematoma from this fall. However I need to have a low index of suspicion with her anticoagulation..  Leg wound I will send her to the wound clinic for treatment of a non healing leg ulcer.

## 2012-10-24 ENCOUNTER — Ambulatory Visit: Payer: 59 | Admitting: *Deleted

## 2012-10-31 ENCOUNTER — Encounter (HOSPITAL_BASED_OUTPATIENT_CLINIC_OR_DEPARTMENT_OTHER): Payer: 59

## 2012-10-31 ENCOUNTER — Encounter (HOSPITAL_BASED_OUTPATIENT_CLINIC_OR_DEPARTMENT_OTHER): Payer: Medicare Other | Attending: General Surgery

## 2012-10-31 DIAGNOSIS — L97809 Non-pressure chronic ulcer of other part of unspecified lower leg with unspecified severity: Secondary | ICD-10-CM | POA: Insufficient documentation

## 2012-10-31 DIAGNOSIS — I10 Essential (primary) hypertension: Secondary | ICD-10-CM | POA: Insufficient documentation

## 2012-10-31 DIAGNOSIS — I872 Venous insufficiency (chronic) (peripheral): Secondary | ICD-10-CM | POA: Insufficient documentation

## 2012-10-31 DIAGNOSIS — I4891 Unspecified atrial fibrillation: Secondary | ICD-10-CM | POA: Insufficient documentation

## 2012-10-31 DIAGNOSIS — I509 Heart failure, unspecified: Secondary | ICD-10-CM | POA: Insufficient documentation

## 2012-11-01 NOTE — Progress Notes (Signed)
[facility]  NAME:  CHERISSE, CARRELL NO.:  [id number]  MEDICAL RECORD NO.:  [id number]      DATE OF BIRTH:  04-Mar-1941  PHYSICIAN:  Ardath Sax, M.D.           VISIT DATE:                                  OFFICE VISIT   Miranda [name] is 73 years of age and has many medical problems including cardiomyopathy with congestive heart failure, atrial fibrillation.  She also has a history of irritable bowel syndrome, lactose intolerance, hypertension, kidney stone.  She also has osteoarthritis and carotid stenosis.  She came to [place] because of increasing problems with chronic venous ulcers on both of her legs. This apparently started when she was trying to wear support hose and caused these abrasions of her legs, which are now venous stasis ulcers. Today, her blood pressure was 105/59, respirations 18, pulse 78, temperature 98.6.  She weighs 124 pounds and is 5 foot 4.  She is on many medicines including Coumadin, Demadex, Imodium, Zaroxolyn, vitamins, and at the present time, she is also on Cipro for these wounds on her legs.  I examined them found them to be typical venous stasis ulcers, and we are going to treat her with Unna boots and Santyl.  She does have marked edema of her legs, and I will see her in a week.     Ardath Sax, M.D.     PP/MEDQ  D:  10/31/2012  T:  11/01/2012  Job:  [id number]

## 2012-11-02 ENCOUNTER — Encounter: Payer: Medicare Other | Attending: Internal Medicine | Admitting: *Deleted

## 2012-11-02 ENCOUNTER — Encounter: Payer: Self-pay | Admitting: *Deleted

## 2012-11-02 VITALS — Ht 64.0 in | Wt 125.0 lb

## 2012-11-02 DIAGNOSIS — Z713 Dietary counseling and surveillance: Secondary | ICD-10-CM | POA: Insufficient documentation

## 2012-11-02 DIAGNOSIS — E46 Unspecified protein-calorie malnutrition: Secondary | ICD-10-CM

## 2012-11-02 NOTE — Progress Notes (Signed)
Medical Nutrition Therapy:  Appt start time: 1415 end time:  1515.  Assessment:  Patient here today for malnutrition. She has a history of CHF, familial amyloidosis, and reflux. She reports that there are a lot of foods she can't tolerate due to reflux. She has lost significant weight over the last few years. She also has issues with lower extremity edema. She prepares all her own meals. She also drinks 2 Carnation Instant Breakfast drinks daily.  MEDICATIONS: Colace, nexium, neurontin, imodium, multivitamin, coumadin   DIETARY INTAKE:   Usual eating pattern includes 3 meals and 3 snacks per day.  24-hr recall:  B ( AM): Buttered toast, oatmeal/scrambled eggs, bacon, carnation instant breakfast /water   Snk ( AM): Ice cream  L ( PM): Grilled cheese sandwich with fries, chicken wings, pizza, water Snk ( PM): Nuts (walnuts), slice of apple, ice cream D ( PM): Meat/fish, baked potato, vegetable (broccoli, cabbage, asparagus), rice/beans/potato Snk ( PM): ice cream Beverages: Milk, water, Carnation instant breakfast  Usual physical activity: None, physical mobility limited  Estimated energy needs: 1500 calories 188 g carbohydrates 94 g protein 42 g fat  Progress Towards Goal(s):  In progress.   Nutritional Diagnosis:  [place]-3.2 Unintentional weight loss As related to reflux, decreased appetite.  As evidenced by 70 pound weight loss over the last 2 years.    Intervention:  Nutrition counseling. We discussed the importance of eating a high calorie, high protein diet. We discussed strategies for increasing calories and protein throughout the day.   Goals: 1. Eat 6 small meals daily.  2. Incorporate high calorie, high protein foods into diet, i.e. Butter/oil on vegetables, starches, mayonnaise, salad dressing, cheese, nut butter, etc.  3. Continue Carnation Instant Breakfast BID  Handouts given during visit include:  High Protein, High Calorie Nutrition Therapy  Monitoring/Evaluation:   Dietary intake, exercise, and body weight prn.

## 2012-11-03 ENCOUNTER — Other Ambulatory Visit (INDEPENDENT_AMBULATORY_CARE_PROVIDER_SITE_OTHER): Payer: 59

## 2012-11-03 ENCOUNTER — Other Ambulatory Visit: Payer: 59

## 2012-11-03 DIAGNOSIS — I429 Cardiomyopathy, unspecified: Secondary | ICD-10-CM

## 2012-11-03 DIAGNOSIS — Z79899 Other long term (current) drug therapy: Secondary | ICD-10-CM

## 2012-11-03 LAB — BASIC METABOLIC PANEL
GFR: 43.03 mL/min — ABNORMAL LOW (ref >60.00)
Potassium: 3.1 mEq/L — ABNORMAL LOW (ref 3.5–5.1)
Sodium: 138 mEq/L (ref 135–145)

## 2012-11-04 ENCOUNTER — Other Ambulatory Visit: Payer: Self-pay | Admitting: *Deleted

## 2012-11-04 DIAGNOSIS — E876 Hypokalemia: Secondary | ICD-10-CM

## 2012-11-07 ENCOUNTER — Ambulatory Visit: Payer: Medicare Other | Admitting: Internal Medicine

## 2012-11-08 ENCOUNTER — Other Ambulatory Visit (HOSPITAL_COMMUNITY): Payer: Self-pay | Admitting: General Surgery

## 2012-11-08 DIAGNOSIS — I872 Venous insufficiency (chronic) (peripheral): Secondary | ICD-10-CM

## 2012-11-08 DIAGNOSIS — T07XXXA Unspecified multiple injuries, initial encounter: Secondary | ICD-10-CM

## 2012-11-10 ENCOUNTER — Other Ambulatory Visit (INDEPENDENT_AMBULATORY_CARE_PROVIDER_SITE_OTHER): Payer: 59

## 2012-11-10 ENCOUNTER — Ambulatory Visit: Payer: 59

## 2012-11-10 DIAGNOSIS — E876 Hypokalemia: Secondary | ICD-10-CM

## 2012-11-10 LAB — BASIC METABOLIC PANEL
CO2: 31 mEq/L (ref 19–32)
Calcium: 8.6 mg/dL (ref 8.4–10.5)
Chloride: 97 mEq/L (ref 96–112)
Sodium: 138 mEq/L (ref 135–145)

## 2012-11-11 ENCOUNTER — Ambulatory Visit (HOSPITAL_COMMUNITY)
Admission: RE | Admit: 2012-11-11 | Discharge: 2012-11-11 | Disposition: A | Discharge status: Home / Self Care | Payer: Medicare Other | Source: Ambulatory Visit | Attending: Cardiovascular Disease | Admitting: Cardiovascular Disease

## 2012-11-11 ENCOUNTER — Ambulatory Visit (INDEPENDENT_AMBULATORY_CARE_PROVIDER_SITE_OTHER): Payer: 59 | Admitting: General Practice

## 2012-11-11 DIAGNOSIS — Z7901 Long term (current) use of anticoagulants: Secondary | ICD-10-CM

## 2012-11-11 DIAGNOSIS — X58XXXA Exposure to other specified factors, initial encounter: Secondary | ICD-10-CM | POA: Insufficient documentation

## 2012-11-11 DIAGNOSIS — I498 Other specified cardiac arrhythmias: Secondary | ICD-10-CM

## 2012-11-11 DIAGNOSIS — T07XXXA Unspecified multiple injuries, initial encounter: Secondary | ICD-10-CM

## 2012-11-11 DIAGNOSIS — E539 Vitamin B deficiency, unspecified: Secondary | ICD-10-CM

## 2012-11-11 DIAGNOSIS — T148XXA Other injury of unspecified body region, initial encounter: Secondary | ICD-10-CM | POA: Insufficient documentation

## 2012-11-11 MED ORDER — CYANOCOBALAMIN 1000 MCG/ML IJ SOLN: 1000.0000 ug | Freq: Once | INTRAMUSCULAR | Status: DC

## 2012-11-11 MED ORDER — CYANOCOBALAMIN 1000 MCG/ML IJ SOLN
1000.0000 ug | Freq: Once | INTRAMUSCULAR | Status: AC
  Administered 2012-11-11: 1000 ug via INTRAMUSCULAR

## 2012-11-11 NOTE — Progress Notes (Signed)
Arterial Lower ext Duplex Doppler Completed. Miranda [name]

## 2012-11-14 ENCOUNTER — Ambulatory Visit (INDEPENDENT_AMBULATORY_CARE_PROVIDER_SITE_OTHER): Payer: Medicare Other | Admitting: Cardiology

## 2012-11-14 ENCOUNTER — Encounter: Payer: Self-pay | Admitting: Cardiology

## 2012-11-14 VITALS — BP 84/54 | HR 88 | Ht 64.0 in | Wt 127.5 lb

## 2012-11-14 DIAGNOSIS — Z79899 Other long term (current) drug therapy: Secondary | ICD-10-CM

## 2012-11-14 NOTE — Patient Instructions (Addendum)
Your physician recommends that you continue on your current medications as directed. Please refer to the Current Medication list given to you today.  Labs in 2 weeks and appointment with Dr Antoine Poche in 6 weeks

## 2012-11-14 NOTE — Progress Notes (Signed)
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  Since I last saw her she has done well.  She is being followed by the wound clinic in having treatment and actually had a skin graft to a lower extremity ulcer.She has had predominantly stable weight. She's not required Zaroxolyn since the last saw her. She's trying to keep her feet elevated more and limits so. She hasn't had any severe presyncope or syncope. She's had no shortness of breath, PND or orthopnea. She's not having any new chest pressure, neck or arm discomfort. Has had a little soreness in her neck since a fall on the ice several weeks ago. However, she's not had any motor or focal deficits or visual disturbances.  Allergies  Allergen Reactions  . Statins Other (See Comments)    LEG CRAMPS     Current Outpatient Prescriptions  Medication Sig Dispense Refill  . Alpha-D-Galactosidase (BEANO PO) Take 1-2 tablets by mouth 2 (two) times daily as needed. GAS       . cyanocobalamin (,VITAMIN B-12,) 1000 MCG/ML injection Inject 1 mL (1,000 mcg total) into the muscle once.  1 mL  0  . diflunisal (DOLOBID) 500 MG TABS Take 0.5 tablets (250 mg total) by mouth 2 (two) times daily.  60 tablet  6  . docusate sodium (COLACE) 100 MG capsule Take 100 mg by mouth as needed. For stool softner      . esomeprazole (NEXIUM) 40 MG capsule Take 1 capsule (40 mg total) by mouth 2 (two) times daily.  60 capsule  5  . gabapentin (NEURONTIN) 100 MG capsule Take 1-3 capsules (100-300 mg total) by mouth 3 (three) times daily.  270 capsule  11  . Hypromellose (GENTEAL) 0.3 % SOLN Place 1 drop into both eyes 2 (two) times daily as needed. DRY EYES       . loperamide (IMODIUM) 2 MG capsule Take 2 mg by mouth 2 (two) times daily.       . metolazone (ZAROXOLYN) 2.5 MG tablet Take 1 tablet (2.5 mg total) by mouth daily as needed. For fluid  30 tablet  11  . Nutritional Supplements (CARNATION INSTANT BREAKFAST PO) Take 1 Package by mouth 2 (two)  times daily.        . potassium chloride (K-DUR) 10 MEQ tablet Take 10-60 mEq by mouth daily as needed. For fluid. Take with for every 20mg  torsemide      . Simethicone 180 MG CAPS Take 1-2 capsules by mouth 2 (two) times daily as needed. GAS       . torsemide (DEMADEX) 20 MG tablet Pt to take 40 mg two to time a day pt to take the third dose as needed  180 tablet  11  . warfarin (COUMADIN) 2.5 MG tablet Take as directed by coumadin clinic  30 tablet  3  . ciprofloxacin (CIPRO) 250 MG tablet Take 1 tablet (250 mg total) by mouth 2 (two) times daily.  14 tablet  0  . Multiple Vitamins-Minerals (MULTIVITAMINS THER. W/MINERALS) TABS Take 1 tablet by mouth daily.       . Probiotic Product (ALIGN PO) Take 1 capsule by mouth daily.        No current facility-administered medications for this visit.    Past Medical History  Diagnosis Date  . Lactose intolerance   . IBS (irritable bowel syndrome)   . Dyslipidemia   . Hypertension   . 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 Kidney stone     R ureteral stone  . Coronary artery disease   . S/P hemorrhoidectomy   . Arthritis   . Carotid stenosis, bilateral PER DUPLEX  04-10-2011    RICA  40-59%/  LICA 60-79%  . Cardiomyopathy secondary EF 30%  . Atrial fibrillation     chronic anticoag  . Hypercholesterolemia   . Spinal stenosis   . Neuromuscular disorder     neuropathy d/t amyloidosis  . ANXIETY   . DEPRESSION   . Branch retinal vein occlusion of left eye     12/2011 event - vision improving - Following with optho and retinal specialist for same    Past Surgical History  Procedure Laterality Date  . Hemorrhoid surgery  2002  . Carpal tunnel release  2005    RIGHT  . Cysto/ bladder bx and fulgeration  02-21-2007  . Right sural nerve bx/ right gastrocemius muscle bx  09-25-2009  . Breast biopsy  1992  . Appendectomy  1961  . Extracorporeal shock  wave lithotripsy  08-10-2011    RIGHT  . Transthoracic echocardiogram  11-24-2010    LVEF 35% WITH DIFFUSE HYPOKINESIS, WORSE IN THE BASE/MID ANTERIOR WALL AND LATERAL WALL/ BASAL INFERIOR/ INFEROSEPTAL AKINESIS/  CAVITY SIZE NORMAL/ WALL THICKNESS WAS INCREASED IN A PATTERN OF SEVERE LVH/ PAPAMETERS CONSISTENT WITH AN IRREVERSIBLE RESTRICTIVE PATTERN/  GRADE 4 DIASTOLIC DYSFUNCTION/ MILD TO MOD. MV REGUR./ BIL. ATRIUM MOD. DILATED/ RVSF MOD. REDUCED/ SMALL PERICARDIAL EFFUSION  . Cystoscopy  09/2011  . Tonsillectomy  10-23-11    child  . Cystoscopy with ureteroscopy  10/28/2011    Procedure: CYSTOSCOPY WITH URETEROSCOPY;  Surgeon: Milford Cage, MD;  Location: [facility];  Service: Urology;  Laterality: Right;    ROS:  As stated in the HPI and negative for all other systems.  PHYSICAL EXAM BP 84/54  Pulse 88  Ht 5\' 4"  (1.626 m)  Wt 127 lb 8 oz (57.834 kg)  BMI 21.87 kg/m2  LMP 08/04/1991 PHYSICAL EXAM GEN:  No distress, frail appearing, there is bruising on the back of her neck NECK:  Jugular venous distention at 90 degrees, waveform within normal limits, carotid upstroke brisk and symmetric, no bruits, no thyromegaly LUNGS:  Clear to auscultation bilaterally BACK:  No CVA tenderness CHEST:  Unremarkable HEART:  S1 and S2 within normal limits, no S3, no clicks, no rubs, no murmurs, irregular ABD:  Positive bowel sounds normal in frequency in pitch, no bruits, no rebound, no guarding, unable to assess midline mass or bruit with the patient seated. EXT:  2 plus pulses throughout,  Moderate bilateral edema in her calfs reduced from previous, no cyanosis no clubbing NEURO:  Diffuse weakness and muscle wasting nonfocal otherwise SKIN:  No rashes, bilateral legs are both bandaged via the wound clinic.   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 She's tolerating the  current diuretic and meds and she staying close attention to salt and fluid restriction. I will make no change in her therapy.  Atrial fibrillation  The patient  tolerates this rhythm and rate control and anticoagulation. We will continue with the meds as listed.  Hypotension  Her blood pressure is actually reasonably good again today. She will continue on the meds as listed.  CKD We will follow her renal function  closely and check a BMET in two weeks.  Her potassium and renal function were stable when checked last week.  Fall This was a mechanical fall on ice several weeks ago.  I had ordered a CT of the head just to followup. However, her insurance company would not approve this. She's having no focal findings. Therefore, other for any further followup of this to Dr. Felicity Coyer in the future. I don't think an MRI is necessary.  Leg wound She is being followed at the wound clinic.

## 2012-11-15 ENCOUNTER — Telehealth: Payer: Self-pay | Admitting: *Deleted

## 2012-11-15 DIAGNOSIS — I10 Essential (primary) hypertension: Secondary | ICD-10-CM

## 2012-11-15 NOTE — Telephone Encounter (Signed)
Message copied by Tarri Fuller on Tue Nov 15, 2012  3:57 PM ------      Message from: Rollene Rotunda      Created: Fri Nov 11, 2012  7:09 PM       Labs OK.  Follow up in one month with repeat BMET. ------

## 2012-11-15 NOTE — Telephone Encounter (Signed)
lmptcb for lab results and to schedule bmet for 1 month, order placed today

## 2012-11-21 ENCOUNTER — Ambulatory Visit (HOSPITAL_COMMUNITY)
Admission: RE | Admit: 2012-11-21 | Discharge: 2012-11-21 | Disposition: A | Discharge status: Home / Self Care | Payer: Medicare Other | Source: Ambulatory Visit | Attending: Cardiovascular Disease | Admitting: Cardiovascular Disease

## 2012-11-21 DIAGNOSIS — I872 Venous insufficiency (chronic) (peripheral): Secondary | ICD-10-CM

## 2012-11-21 NOTE — Progress Notes (Signed)
Venous duplex doppler was completed of both lower extremities. Larene Pickett RVT

## 2012-11-28 ENCOUNTER — Encounter (HOSPITAL_BASED_OUTPATIENT_CLINIC_OR_DEPARTMENT_OTHER): Payer: Medicare Other | Attending: General Surgery

## 2012-11-28 ENCOUNTER — Other Ambulatory Visit: Payer: Medicare Other

## 2012-11-28 DIAGNOSIS — I872 Venous insufficiency (chronic) (peripheral): Secondary | ICD-10-CM | POA: Insufficient documentation

## 2012-11-28 DIAGNOSIS — L97809 Non-pressure chronic ulcer of other part of unspecified lower leg with unspecified severity: Secondary | ICD-10-CM | POA: Insufficient documentation

## 2012-11-28 DIAGNOSIS — I87309 Chronic venous hypertension (idiopathic) without complications of unspecified lower extremity: Secondary | ICD-10-CM | POA: Insufficient documentation

## 2012-11-30 ENCOUNTER — Telehealth: Payer: Self-pay | Admitting: *Deleted

## 2012-11-30 NOTE — Telephone Encounter (Signed)
Patient called and stated she was started on dicloxacillin 500 mg qid started 11/28/2012 for infected wound on leg, per Weston Brass, PhamD there is no interaction with coumadin, pt has appt in clinic tomorrow.

## 2012-12-01 ENCOUNTER — Ambulatory Visit (INDEPENDENT_AMBULATORY_CARE_PROVIDER_SITE_OTHER): Payer: Medicare Other

## 2012-12-01 ENCOUNTER — Other Ambulatory Visit (INDEPENDENT_AMBULATORY_CARE_PROVIDER_SITE_OTHER): Payer: Medicare Other

## 2012-12-01 DIAGNOSIS — Z7901 Long term (current) use of anticoagulants: Secondary | ICD-10-CM

## 2012-12-01 DIAGNOSIS — I498 Other specified cardiac arrhythmias: Secondary | ICD-10-CM

## 2012-12-01 DIAGNOSIS — Z79899 Other long term (current) drug therapy: Secondary | ICD-10-CM

## 2012-12-01 LAB — BASIC METABOLIC PANEL
CO2: 32 mEq/L (ref 19–32)
Calcium: 8.7 mg/dL (ref 8.4–10.5)
Chloride: 94 mEq/L — ABNORMAL LOW (ref 96–112)
Sodium: 136 mEq/L (ref 135–145)

## 2012-12-01 LAB — POCT INR: INR: 2.4

## 2012-12-08 ENCOUNTER — Other Ambulatory Visit: Payer: Self-pay | Admitting: Cardiology

## 2012-12-08 ENCOUNTER — Telehealth: Payer: Self-pay | Admitting: *Deleted

## 2012-12-08 NOTE — Telephone Encounter (Signed)
Pt states she has been placed on Doxycycline 100 mg bid for 7 days and will start today. Changed pts appt for her to be seen in clinic to check INR on Monday 12/12/2012. Pts states understanding.

## 2012-12-12 ENCOUNTER — Ambulatory Visit (INDEPENDENT_AMBULATORY_CARE_PROVIDER_SITE_OTHER): Payer: Medicare Other | Admitting: Pharmacist

## 2012-12-12 DIAGNOSIS — I498 Other specified cardiac arrhythmias: Secondary | ICD-10-CM

## 2012-12-12 DIAGNOSIS — Z7901 Long term (current) use of anticoagulants: Secondary | ICD-10-CM

## 2012-12-15 ENCOUNTER — Other Ambulatory Visit (INDEPENDENT_AMBULATORY_CARE_PROVIDER_SITE_OTHER): Payer: Medicare Other

## 2012-12-15 DIAGNOSIS — E785 Hyperlipidemia, unspecified: Secondary | ICD-10-CM

## 2012-12-15 DIAGNOSIS — I10 Essential (primary) hypertension: Secondary | ICD-10-CM

## 2012-12-15 LAB — BASIC METABOLIC PANEL
GFR: 44.2 mL/min — ABNORMAL LOW (ref >60.00)
Glucose, Bld: 85 mg/dL (ref 70–99)
Potassium: 3.2 mEq/L — ABNORMAL LOW (ref 3.5–5.1)
Sodium: 137 mEq/L (ref 135–145)

## 2012-12-16 ENCOUNTER — Telehealth: Payer: Self-pay | Admitting: *Deleted

## 2012-12-16 DIAGNOSIS — I498 Other specified cardiac arrhythmias: Secondary | ICD-10-CM

## 2012-12-16 NOTE — Telephone Encounter (Signed)
Patient notified of lab results and provider instructions to take an extra of potassium x2 days and repeat BMET in two weeks.

## 2012-12-16 NOTE — Progress Notes (Signed)
Quick Note:  Patient notified of lab results and provider recommendation to take an extra 40 meq potassium x2 days and then to return to repeat BMET in two weeks. Patient has appointment in early May and will do labs at that time. ______

## 2012-12-19 ENCOUNTER — Ambulatory Visit (INDEPENDENT_AMBULATORY_CARE_PROVIDER_SITE_OTHER): Payer: Medicare Other | Admitting: Internal Medicine

## 2012-12-19 ENCOUNTER — Encounter: Payer: Self-pay | Admitting: Internal Medicine

## 2012-12-19 VITALS — BP 90/60 | HR 92 | Temp 97.3°F | Wt 127.0 lb

## 2012-12-19 DIAGNOSIS — M62838 Other muscle spasm: Secondary | ICD-10-CM

## 2012-12-19 DIAGNOSIS — E538 Deficiency of other specified B group vitamins: Secondary | ICD-10-CM

## 2012-12-19 DIAGNOSIS — G63 Polyneuropathy in diseases classified elsewhere: Secondary | ICD-10-CM

## 2012-12-19 DIAGNOSIS — E8589 Other amyloidosis: Secondary | ICD-10-CM

## 2012-12-19 DIAGNOSIS — J309 Allergic rhinitis, unspecified: Secondary | ICD-10-CM

## 2012-12-19 DIAGNOSIS — E852 Heredofamilial amyloidosis, unspecified: Secondary | ICD-10-CM

## 2012-12-19 DIAGNOSIS — N058 Unspecified nephritic syndrome with other morphologic changes: Secondary | ICD-10-CM

## 2012-12-19 MED ORDER — CYCLOBENZAPRINE HCL 5 MG PO TABS: 5.0000 mg | ORAL_TABLET | Freq: Every evening | ORAL | Status: DC | PRN

## 2012-12-19 MED ORDER — FLUTICASONE PROPIONATE 50 MCG/ACT NA SUSP: 2.0000 | Freq: Every day | NASAL | Status: DC

## 2012-12-19 MED ORDER — CYANOCOBALAMIN 1000 MCG/ML IJ SOLN
1000.0000 ug | Freq: Once | INTRAMUSCULAR | Status: AC
  Administered 2012-12-19: 1000 ug via INTRAMUSCULAR

## 2012-12-19 NOTE — Progress Notes (Signed)
Subjective:    Patient ID: Miranda [name], female    DOB: 1941/08/01, 72 y.o.   MRN: [id number]  HPI here for follow up - reviewed chronic medical issues and interval events today:  familial amyloidosis - dx in 09/2009 after extensive neuro eval locally and [facility] - nerve and muscle bx 09/2009 confirmed amyloid and genetic testin 01/2010 confirmed AD dz - s/p specialty eval at [facility] 03/2010 - using off label diflunisal for same - complicated by CM (since 2008), periph neuropathy with sensorimotor deficits, autonomic dysfx of GI including satiety and "IBS-like" alternating bowels  Atrial fibrillation - related to CM - on chronic anticoag with [facility] for same  B12 deficiency - diagnosed by neurology during workup in 2009 - prior IM shots at PCP/neuro -resumed same may 2013  Cardiomyopathy - dx 2008 - denies increase/change in edema, dyspnea on exertion or syncope - no PND - the patient reports compliance with medication(s) as prescribed. Denies adverse side effects. Works with cards regularly on same  also complains of nasal congestion and year round allergies - ?Flonase  Past Medical History  Diagnosis Date  . Lactose intolerance   . IBS (irritable bowel syndrome)   . Dyslipidemia   . Hypertension   . 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 Kidney stone     R ureteral stone  . Coronary artery disease   . S/P hemorrhoidectomy   . Arthritis   . Carotid stenosis, bilateral PER DUPLEX  04-10-2011    RICA  40-59%/  LICA 60-79%  . Cardiomyopathy secondary EF 30%  . Atrial fibrillation     chronic anticoag  . Hypercholesterolemia   . Spinal stenosis   . Neuromuscular disorder     neuropathy d/t amyloidosis  . ANXIETY   . DEPRESSION   . Branch retinal vein occlusion of left eye     12/2011 event - vision improving - Following with optho and retinal specialist for same    Review of  Systems  Constitutional: Negative for fever or weight change.  Respiratory: Negative for cough and shortness of breath.   Cardiovascular: Negative for chest pain or palpitations.  MSkel: continued L neck pain/spasms with movement since fall 08/2012 - no radiation into back/arm     Objective:   Physical Exam  BP 90/60  Pulse 92  Temp(Src) 97.3 F (36.3 C) (Oral)  Wt 127 lb (57.607 kg)  BMI 21.79 kg/m2  SpO2 96%  LMP 08/04/1991 Wt Readings from Last 3 Encounters:  12/19/12 127 lb (57.607 kg)  11/14/12 127 lb 8 oz (57.834 kg)  11/02/12 125 lb (56.7 kg)   Constitutional: She is thin, but appears well-developed and well-nourished. No distress. Uses RW Neck: Normal range of motion. Neck supple, but +spasm on L side. No JVD or LAD present. No thyromegaly present.  Cardiovascular: Irregular rate and rhythm and normal heart sounds.  No murmur heard. 1+ BLE edema, wearing compression hose. Pulmonary/Chest: Effort normal and breath sounds normal. No respiratory distress. She has no wheezes.  Neurological: She is alert and oriented to person, place, and time. No cranial nerve deficit. Coordination slow but normal (baseline).  Neurological: AAOx4, CN2-12 symmetrically intact - speech fluent, MAE well, balance at baseline Psychiatric: She has a normal mood and affect. Her behavior is normal. Judgment and thought content normal.   Lab Results  Component Value Date   WBC 7.1 09/14/2012   HGB  14.8 09/14/2012   HCT 43.0 09/14/2012   PLT 159 09/14/2012   GLUCOSE 85 12/15/2012   CHOL 174 06/08/2012   TRIG 78.0 06/08/2012   HDL 59.80 06/08/2012   LDLCALC 99 06/08/2012   ALT 13 09/14/2012   AST 27 09/14/2012   NA 137 12/15/2012   K 3.2* 12/15/2012   CL 93* 12/15/2012   CREATININE 1.3* 12/15/2012   BUN 39* 12/15/2012   CO2 33* 12/15/2012   INR 1.9 12/12/2012   Dg Chest 2 View  09/14/2012  *RADIOLOGY REPORT*  Clinical Data: Fall, left-sided chest pain.  CHEST - 2 VIEW  Comparison: 10/19/2011   Findings: Cardiomegaly.  Blunting of the costophrenic angles bilaterally which may represent small effusions.  This is similar to prior study.  No focal airspace opacity.  No acute bony abnormality.  No visible rib fracture.  No pneumothorax.  IMPRESSION: Cardiomegaly.  Suspect small bilateral effusions.   Original Report Authenticated By: Charlett Nose, M.D.    Dg Lumbar Spine Complete  09/14/2012  *RADIOLOGY REPORT*  Clinical Data: Fall, low back pain.  LUMBAR SPINE - COMPLETE 4+ VIEW  Comparison: 10/19/2011  Findings: Levoscoliosis in the lumbar spine.  Degenerative disc disease and facet disease throughout the lumbar spine.  No fracture or subluxation.  SI joints are symmetric and unremarkable.  IMPRESSION: Levoscoliosis and spondylosis.  No acute findings.   Original Report Authenticated By: Charlett Nose, M.D.    Dg Sacrum/coccyx  09/14/2012  *RADIOLOGY REPORT*  Clinical Data: Fall, pain.  SACRUM AND COCCYX - 2+ VIEW  Comparison: CT 10/09/2005  Findings: SI joints are symmetric and unremarkable.  Moderate degenerative changes in the lower lumbar spine and hips bilaterally.  No acute bony abnormality.  No visualized sacral or coccygeal fracture.  IMPRESSION: No acute bony abnormality.   Original Report Authenticated By: Charlett Nose, M.D.    Ct Head Wo Contrast  09/14/2012  *RADIOLOGY REPORT*  Clinical Data:  Fall  CT HEAD WITHOUT CONTRAST CT CERVICAL SPINE WITHOUT CONTRAST  Technique:  Multidetector CT imaging of the head and cervical spine was performed following the standard protocol without intravenous contrast.  Multiplanar CT image reconstructions of the cervical spine were also generated.  Comparison:  06/07/2008  CT HEAD  Findings: No skull fracture is noted.  Paranasal sinuses and mastoid air cells are unremarkable.  No intracranial hemorrhage, mass effect or midline shift.  Ventricular size is stable from prior exam.  Mild cerebral atrophy.  No acute infarction.  No mass lesion is noted on this  unenhanced scan.  IMPRESSION: No acute intracranial abnormality.  CT CERVICAL SPINE  Findings: Axial images of the cervical spine shows no acute fracture or subluxation.  There is no pneumothorax in visualized lung apices.  Air is noted in the upper esophagus.  Computer processed images shows no acute fracture or subluxation.  There is disc space flattening with mild anterior spurring and mild posterior disc bulge at C3-C4 level.  Disc space flattening with mild anterior and mild posterior spurring and mild disc bulge at C5- C6 and C6-C7 level.  No prevertebral soft tissue swelling. Cervical airway is patent.  Mild emphysematous changes are noted in the right apex.  IMPRESSION: No acute fracture or subluxation.  Degenerative changes as described above.   Original Report Authenticated By: Natasha Mead, M.D.       Assessment & Plan:  See problem list. Medications and labs reviewed today.  L neck spasm - residual from fall 08/2012 - neuro and motor intact -  no radicular symptoms - add low dose muscle relaxer and advised PT - pt will call if referral needed  allergic rhinitis - ?postnasal drip contributing to GERD per GI providers at [facility] - begin Flonase spray for same - continue management per GI for other gastric issues

## 2012-12-19 NOTE — Assessment & Plan Note (Signed)
Ongoing off label use diflunisal for same HX again reviewed today including complications and progression of disease Increased gabapentin 08/2012 for GI symptoms and neuropathy -  Follows for "IBS" at tertiary center [facility]) No other tx changes recommended -  To continue follow up with local neuro (?[facility] study upcoming) and [facility] Loop for other tx needs as they arise

## 2012-12-19 NOTE — Patient Instructions (Addendum)
It was good to see you today. We have reviewed your prior records including labs and tests today Use Flexeril muscle relaxer at bedtime as needed for neck pain and spasm -  Also start Flonase allergy nose spray daily for nasal congestion and allergies Your prescription(s) have been submitted to your pharmacy. Please take as directed and contact our office if you believe you are having problem(s) with the medication(s). If neck pain unimproved in the next 2 weeks with medication and physical therapy, call for additional evaluation as needed If referral for physical therapy is necessary, please let us know and I will make referral order for same Followup in 6 months for routine review, sooner if other problems

## 2012-12-22 ENCOUNTER — Encounter (HOSPITAL_BASED_OUTPATIENT_CLINIC_OR_DEPARTMENT_OTHER): Payer: Medicare Other | Attending: Internal Medicine

## 2012-12-22 DIAGNOSIS — L97909 Non-pressure chronic ulcer of unspecified part of unspecified lower leg with unspecified severity: Secondary | ICD-10-CM | POA: Insufficient documentation

## 2012-12-22 DIAGNOSIS — I87319 Chronic venous hypertension (idiopathic) with ulcer of unspecified lower extremity: Secondary | ICD-10-CM | POA: Insufficient documentation

## 2012-12-27 ENCOUNTER — Encounter: Payer: Self-pay | Admitting: Cardiology

## 2012-12-27 ENCOUNTER — Ambulatory Visit (INDEPENDENT_AMBULATORY_CARE_PROVIDER_SITE_OTHER): Payer: Medicare Other | Admitting: *Deleted

## 2012-12-27 ENCOUNTER — Ambulatory Visit (INDEPENDENT_AMBULATORY_CARE_PROVIDER_SITE_OTHER): Payer: Medicare Other | Admitting: Cardiology

## 2012-12-27 VITALS — BP 88/59 | HR 97 | Ht 64.0 in | Wt 125.8 lb

## 2012-12-27 DIAGNOSIS — Z7901 Long term (current) use of anticoagulants: Secondary | ICD-10-CM

## 2012-12-27 DIAGNOSIS — I10 Essential (primary) hypertension: Secondary | ICD-10-CM

## 2012-12-27 DIAGNOSIS — I498 Other specified cardiac arrhythmias: Secondary | ICD-10-CM

## 2012-12-27 DIAGNOSIS — I2510 Atherosclerotic heart disease of native coronary artery without angina pectoris: Secondary | ICD-10-CM

## 2012-12-27 LAB — BASIC METABOLIC PANEL
BUN: 31 mg/dL — ABNORMAL HIGH (ref 6–23)
Chloride: 97 mEq/L (ref 96–112)
Potassium: 3.5 mEq/L (ref 3.5–5.1)

## 2012-12-27 NOTE — Progress Notes (Signed)
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  Since I last saw her she has done relatively well.  She continues to have her legs wrapped by the wound clinicShe has had predominantly stable weight. She's trying to keep her feet elevated more and limits so. She hasn't had any severe presyncope or syncope  After she had a skin graft. Though she has had a little dizziness at times.. She's had no shortness of breath, PND or orthopnea. She's not having any new chest pressure, neck or arm discomfort. However, she's not had any motor or focal deficits or visual disturbances.  Allergies  Allergen Reactions  . Statins Other (See Comments)    LEG CRAMPS     Current Outpatient Prescriptions  Medication Sig Dispense Refill  . Alpha-D-Galactosidase (BEANO PO) Take 1-2 tablets by mouth 2 (two) times daily as needed. GAS       . cyanocobalamin (,VITAMIN B-12,) 1000 MCG/ML injection Inject 1 mL (1,000 mcg total) into the muscle once.  1 mL  0  . cyclobenzaprine (FLEXERIL) 5 MG tablet Take 1 tablet (5 mg total) by mouth at bedtime as needed for muscle spasms.  30 tablet  0  . diflunisal (DOLOBID) 500 MG TABS TAKE HALF TABLET BY MOUTH TWICE DAILY  60 tablet  3  . docusate sodium (COLACE) 100 MG capsule Take 100 mg by mouth as needed. For stool softner      . esomeprazole (NEXIUM) 40 MG capsule Take 1 capsule (40 mg total) by mouth 2 (two) times daily.  60 capsule  5  . fluticasone (FLONASE) 50 MCG/ACT nasal spray Place 2 sprays into the nose daily.  16 g  2  . gabapentin (NEURONTIN) 100 MG capsule Take 1-3 capsules (100-300 mg total) by mouth 3 (three) times daily.  270 capsule  11  . Hypromellose (GENTEAL) 0.3 % SOLN Place 1 drop into both eyes 2 (two) times daily as needed. DRY EYES       . loperamide (IMODIUM) 2 MG capsule Take 2 mg by mouth 2 (two) times daily.       . metolazone (ZAROXOLYN) 2.5 MG tablet Take 1 tablet (2.5 mg total) by mouth daily as needed. For fluid  30 tablet   11  . Multiple Vitamins-Minerals (MULTIVITAMINS THER. W/MINERALS) TABS Take 1 tablet by mouth daily.       . Nutritional Supplements (CARNATION INSTANT BREAKFAST PO) Take 1 Package by mouth 2 (two) times daily.        . potassium chloride (K-DUR) 10 MEQ tablet Take 10-60 mEq by mouth daily as needed. For fluid. Take with for every 20mg  torsemide      . Probiotic Product (ALIGN PO) Take 1 capsule by mouth daily.       . Simethicone 180 MG CAPS Take 1-2 capsules by mouth 2 (two) times daily as needed. GAS       . torsemide (DEMADEX) 20 MG tablet Pt to take 40 mg two to time a day pt to take the third dose as needed  180 tablet  11  . warfarin (COUMADIN) 2.5 MG tablet Take as directed by coumadin clinic  30 tablet  3   No current facility-administered medications for this visit.    Past Medical History  Diagnosis Date  . Lactose intolerance   . IBS (irritable bowel syndrome)   . Dyslipidemia   . Hypertension   . Amyloid disease dx 09/2009    Familial amyloidosis, AD:  CM, periph neuropathy, motor weakness and autonomic GI dysfx  . Kidney stone     R ureteral stone  . Coronary artery disease   . S/P hemorrhoidectomy   . Arthritis   . Carotid stenosis, bilateral PER DUPLEX  04-10-2011    RICA  40-59%/  LICA 60-79%  . Cardiomyopathy secondary EF 30%  . Atrial fibrillation     chronic anticoag  . Hypercholesterolemia   . Spinal stenosis   . Neuromuscular disorder     neuropathy d/t amyloidosis  . ANXIETY   . DEPRESSION   . Branch retinal vein occlusion of left eye     12/2011 event - vision improving - Following with optho and retinal specialist for same    Past Surgical History  Procedure Laterality Date  . Hemorrhoid surgery  2002  . Carpal tunnel release  2005    RIGHT  . Cysto/ bladder bx and fulgeration  02-21-2007  . Right sural nerve bx/ right gastrocemius muscle bx  09-25-2009  . Breast biopsy  1992  . Appendectomy  1961  . Extracorporeal shock wave lithotripsy   08-10-2011    RIGHT  . Transthoracic echocardiogram  11-24-2010    LVEF 35% WITH DIFFUSE HYPOKINESIS, WORSE IN THE BASE/MID ANTERIOR WALL AND LATERAL WALL/ BASAL INFERIOR/ INFEROSEPTAL AKINESIS/  CAVITY SIZE NORMAL/ WALL THICKNESS WAS INCREASED IN A PATTERN OF SEVERE LVH/ PAPAMETERS CONSISTENT WITH AN IRREVERSIBLE RESTRICTIVE PATTERN/  GRADE 4 DIASTOLIC DYSFUNCTION/ MILD TO MOD. MV REGUR./ BIL. ATRIUM MOD. DILATED/ RVSF MOD. REDUCED/ SMALL PERICARDIAL EFFUSION  . Cystoscopy  09/2011  . Tonsillectomy  10-23-11    child  . Cystoscopy with ureteroscopy  10/28/2011    Procedure: CYSTOSCOPY WITH URETEROSCOPY;  Surgeon: Milford Cage, MD;  Location: [facility];  Service: Urology;  Laterality: Right;    ROS:  As stated in the HPI and negative for all other systems.  PHYSICAL EXAM BP 88/59  Pulse 97  Ht 5\' 4"  (1.626 m)  Wt 125 lb 12.8 oz (57.063 kg)  BMI 21.58 kg/m2  LMP 08/04/1991 GEN:  No distress, frail appearing, there is bruising on the back of her neck NECK:  Jugular venous distention at 90 degrees, waveform within normal limits, carotid upstroke brisk and symmetric, no bruits, no thyromegaly LUNGS:  Clear to auscultation bilaterally BACK:  No CVA tenderness CHEST:  Unremarkable HEART:  S1 and S2 within normal limits, no S3, no clicks, no rubs, no murmurs, irregular ABD:  Positive bowel sounds normal in frequency in pitch, no bruits, no rebound, no guarding, unable to assess midline mass or bruit with the patient seated. EXT:  2 plus pulses throughout,  Moderate bilateral edema in her calfs reduced from previous, no cyanosis no clubbing NEURO:  Diffuse weakness and muscle wasting nonfocal otherwise SKIN:  No rashes, bilateral legs are both bandaged via the wound clinic.   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  No change in therapy.    Atrial fibrillation  The patient  tolerates this rhythm and  rate control and anticoagulation. We will continue with the meds as listed.  Hypotension  She tolerates the low BP.  No change in therapy is indicated.   CKD We will check a BMET again today.   Leg wound She is being followed at the wound clinic.

## 2012-12-27 NOTE — Patient Instructions (Addendum)
The current medical regimen is effective;  continue present plan and medications.  Please have blood work today  Follow up in 4 months with Dr Antoine Poche.

## 2013-01-11 ENCOUNTER — Ambulatory Visit: Payer: Self-pay | Admitting: Ophthalmology

## 2013-01-18 ENCOUNTER — Ambulatory Visit (INDEPENDENT_AMBULATORY_CARE_PROVIDER_SITE_OTHER): Payer: Medicare Other | Admitting: General Practice

## 2013-01-18 ENCOUNTER — Ambulatory Visit (INDEPENDENT_AMBULATORY_CARE_PROVIDER_SITE_OTHER): Payer: Medicare Other | Admitting: Family Medicine

## 2013-01-18 DIAGNOSIS — E538 Deficiency of other specified B group vitamins: Secondary | ICD-10-CM

## 2013-01-18 DIAGNOSIS — G63 Polyneuropathy in diseases classified elsewhere: Secondary | ICD-10-CM

## 2013-01-18 DIAGNOSIS — I498 Other specified cardiac arrhythmias: Secondary | ICD-10-CM

## 2013-01-18 DIAGNOSIS — Z7901 Long term (current) use of anticoagulants: Secondary | ICD-10-CM

## 2013-01-18 LAB — POCT INR: INR: 2.5

## 2013-01-18 MED ORDER — CYANOCOBALAMIN 1000 MCG/ML IJ SOLN
1000.0000 ug | Freq: Once | INTRAMUSCULAR | Status: AC
  Administered 2013-01-18: 1000 ug via INTRAMUSCULAR

## 2013-01-21 ENCOUNTER — Other Ambulatory Visit: Payer: Self-pay | Admitting: Internal Medicine

## 2013-01-24 ENCOUNTER — Other Ambulatory Visit: Payer: Self-pay | Admitting: Cardiology

## 2013-01-25 ENCOUNTER — Ambulatory Visit (INDEPENDENT_AMBULATORY_CARE_PROVIDER_SITE_OTHER): Payer: Medicare Other | Admitting: *Deleted

## 2013-01-25 DIAGNOSIS — I10 Essential (primary) hypertension: Secondary | ICD-10-CM

## 2013-01-25 LAB — BASIC METABOLIC PANEL
CO2: 33 mEq/L — ABNORMAL HIGH (ref 19–32)
Chloride: 95 mEq/L — ABNORMAL LOW (ref 96–112)
Creatinine, Ser: 1.3 mg/dL — ABNORMAL HIGH (ref 0.4–1.2)
Potassium: 3.7 mEq/L (ref 3.5–5.1)
Sodium: 141 mEq/L (ref 135–145)

## 2013-01-26 ENCOUNTER — Encounter (HOSPITAL_BASED_OUTPATIENT_CLINIC_OR_DEPARTMENT_OTHER): Payer: Medicare Other | Attending: Internal Medicine

## 2013-01-26 DIAGNOSIS — L97909 Non-pressure chronic ulcer of unspecified part of unspecified lower leg with unspecified severity: Secondary | ICD-10-CM | POA: Insufficient documentation

## 2013-01-26 DIAGNOSIS — L97809 Non-pressure chronic ulcer of other part of unspecified lower leg with unspecified severity: Secondary | ICD-10-CM | POA: Insufficient documentation

## 2013-01-26 DIAGNOSIS — I87309 Chronic venous hypertension (idiopathic) without complications of unspecified lower extremity: Secondary | ICD-10-CM | POA: Insufficient documentation

## 2013-01-26 NOTE — Progress Notes (Signed)
Labs OK.  Call Ms. Lahue with the results.

## 2013-02-01 ENCOUNTER — Ambulatory Visit: Payer: Self-pay | Admitting: Ophthalmology

## 2013-02-02 ENCOUNTER — Telehealth: Payer: Self-pay | Admitting: Cardiology

## 2013-02-02 NOTE — Telephone Encounter (Signed)
Follow Up      Pt calling in wanting to follow up on test results.

## 2013-02-02 NOTE — Telephone Encounter (Signed)
Pt notified of preliminary result.

## 2013-02-06 ENCOUNTER — Telehealth: Payer: Self-pay | Admitting: *Deleted

## 2013-02-06 NOTE — Telephone Encounter (Signed)
Just FYI - Dr Deterding is starting her on Midodrine.  Will forward to MD for his knowledge.

## 2013-02-07 ENCOUNTER — Other Ambulatory Visit: Payer: Self-pay | Admitting: Internal Medicine

## 2013-02-13 ENCOUNTER — Inpatient Hospital Stay (HOSPITAL_COMMUNITY)
Admission: AD | Admit: 2013-02-13 | Discharge: 2013-02-21 | DRG: 292 | Disposition: A | Discharge status: Skilled Nursing Facility | Payer: Medicare Other | Source: Ambulatory Visit | Attending: Cardiology | Admitting: Cardiology

## 2013-02-13 ENCOUNTER — Ambulatory Visit (INDEPENDENT_AMBULATORY_CARE_PROVIDER_SITE_OTHER): Payer: Medicare Other | Admitting: Cardiology

## 2013-02-13 ENCOUNTER — Encounter (HOSPITAL_COMMUNITY): Payer: Self-pay | Admitting: *Deleted

## 2013-02-13 ENCOUNTER — Observation Stay (HOSPITAL_COMMUNITY): Payer: Medicare Other

## 2013-02-13 ENCOUNTER — Encounter: Payer: Self-pay | Admitting: Cardiology

## 2013-02-13 VITALS — BP 82/75 | HR 84 | Ht 65.0 in | Wt 128.0 lb

## 2013-02-13 DIAGNOSIS — I472 Ventricular tachycardia, unspecified: Secondary | ICD-10-CM | POA: Diagnosis present

## 2013-02-13 DIAGNOSIS — E785 Hyperlipidemia, unspecified: Secondary | ICD-10-CM | POA: Diagnosis present

## 2013-02-13 DIAGNOSIS — G609 Hereditary and idiopathic neuropathy, unspecified: Secondary | ICD-10-CM | POA: Diagnosis present

## 2013-02-13 DIAGNOSIS — I4891 Unspecified atrial fibrillation: Secondary | ICD-10-CM | POA: Diagnosis present

## 2013-02-13 DIAGNOSIS — I509 Heart failure, unspecified: Secondary | ICD-10-CM | POA: Diagnosis present

## 2013-02-13 DIAGNOSIS — E852 Heredofamilial amyloidosis, unspecified: Secondary | ICD-10-CM

## 2013-02-13 DIAGNOSIS — F3289 Other specified depressive episodes: Secondary | ICD-10-CM | POA: Diagnosis present

## 2013-02-13 DIAGNOSIS — I43 Cardiomyopathy in diseases classified elsewhere: Secondary | ICD-10-CM | POA: Diagnosis present

## 2013-02-13 DIAGNOSIS — I10 Essential (primary) hypertension: Secondary | ICD-10-CM

## 2013-02-13 DIAGNOSIS — I2510 Atherosclerotic heart disease of native coronary artery without angina pectoris: Secondary | ICD-10-CM

## 2013-02-13 DIAGNOSIS — Z79899 Other long term (current) drug therapy: Secondary | ICD-10-CM

## 2013-02-13 DIAGNOSIS — N390 Urinary tract infection, site not specified: Secondary | ICD-10-CM

## 2013-02-13 DIAGNOSIS — I5043 Acute on chronic combined systolic (congestive) and diastolic (congestive) heart failure: Principal | ICD-10-CM | POA: Diagnosis present

## 2013-02-13 DIAGNOSIS — E875 Hyperkalemia: Secondary | ICD-10-CM

## 2013-02-13 DIAGNOSIS — I5042 Chronic combined systolic (congestive) and diastolic (congestive) heart failure: Secondary | ICD-10-CM

## 2013-02-13 DIAGNOSIS — R531 Weakness: Secondary | ICD-10-CM

## 2013-02-13 DIAGNOSIS — F329 Major depressive disorder, single episode, unspecified: Secondary | ICD-10-CM | POA: Diagnosis present

## 2013-02-13 DIAGNOSIS — I959 Hypotension, unspecified: Secondary | ICD-10-CM

## 2013-02-13 DIAGNOSIS — E8589 Other amyloidosis: Secondary | ICD-10-CM | POA: Diagnosis present

## 2013-02-13 DIAGNOSIS — G629 Polyneuropathy, unspecified: Secondary | ICD-10-CM

## 2013-02-13 DIAGNOSIS — F411 Generalized anxiety disorder: Secondary | ICD-10-CM | POA: Diagnosis present

## 2013-02-13 DIAGNOSIS — Z7901 Long term (current) use of anticoagulants: Secondary | ICD-10-CM

## 2013-02-13 DIAGNOSIS — E854 Organ-limited amyloidosis: Secondary | ICD-10-CM

## 2013-02-13 DIAGNOSIS — E639 Nutritional deficiency, unspecified: Secondary | ICD-10-CM | POA: Diagnosis present

## 2013-02-13 DIAGNOSIS — N183 Chronic kidney disease, stage 3 unspecified: Secondary | ICD-10-CM | POA: Diagnosis present

## 2013-02-13 DIAGNOSIS — Z87891 Personal history of nicotine dependence: Secondary | ICD-10-CM

## 2013-02-13 DIAGNOSIS — N179 Acute kidney failure, unspecified: Secondary | ICD-10-CM | POA: Diagnosis present

## 2013-02-13 DIAGNOSIS — Z515 Encounter for palliative care: Secondary | ICD-10-CM

## 2013-02-13 DIAGNOSIS — I9589 Other hypotension: Secondary | ICD-10-CM | POA: Diagnosis present

## 2013-02-13 DIAGNOSIS — I4729 Other ventricular tachycardia: Secondary | ICD-10-CM | POA: Diagnosis present

## 2013-02-13 DIAGNOSIS — R000 Tachycardia, unspecified: Secondary | ICD-10-CM

## 2013-02-13 DIAGNOSIS — I429 Cardiomyopathy, unspecified: Secondary | ICD-10-CM

## 2013-02-13 LAB — BASIC METABOLIC PANEL
CO2: 25 mEq/L (ref 19–32)
CO2: 30 mEq/L (ref 19–32)
Calcium: 9.4 mg/dL (ref 8.4–10.5)
Chloride: 91 mEq/L — ABNORMAL LOW (ref 96–112)
Creatinine, Ser: 1.99 mg/dL — ABNORMAL HIGH (ref 0.50–1.10)
Creatinine, Ser: 2.16 mg/dL — ABNORMAL HIGH (ref 0.50–1.10)
GFR calc Af Amer: 27 mL/min — ABNORMAL LOW (ref >90)
GFR calc Af Amer: 25 mL/min — ABNORMAL LOW (ref >90)
GFR calc Af Amer: 27 mL/min — ABNORMAL LOW (ref >90)
GFR calc non Af Amer: 24 mL/min — ABNORMAL LOW (ref >90)
GFR calc non Af Amer: 23 mL/min — ABNORMAL LOW (ref >90)
Glucose, Bld: 155 mg/dL — ABNORMAL HIGH (ref 70–99)
Potassium: 6.6 mEq/L (ref 3.5–5.1)
Potassium: 5.3 mEq/L — ABNORMAL HIGH (ref 3.5–5.1)
Sodium: 131 mEq/L — ABNORMAL LOW (ref 135–145)
Sodium: 136 mEq/L (ref 135–145)

## 2013-02-13 LAB — HEPATIC FUNCTION PANEL
ALT: 186 U/L — ABNORMAL HIGH (ref 0–35)
Albumin: 3.3 g/dL — ABNORMAL LOW (ref 3.5–5.2)
Alkaline Phosphatase: 126 U/L — ABNORMAL HIGH (ref 39–117)
Indirect Bilirubin: 0.5 mg/dL (ref 0.3–0.9)
Total Protein: 7 g/dL (ref 6.0–8.3)

## 2013-02-13 LAB — CBC WITH DIFFERENTIAL/PLATELET
Basophils Absolute: 0 10*3/uL (ref 0.0–0.1)
Lymphocytes Relative: 14 % (ref 12–46)
Lymphs Abs: 1.3 10*3/uL (ref 0.7–4.0)
Neutro Abs: 7.6 10*3/uL (ref 1.7–7.7)
Neutrophils Relative %: 80 % — ABNORMAL HIGH (ref 43–77)
Platelets: 232 10*3/uL (ref 150–400)
RBC: 4.74 MIL/uL (ref 3.87–5.11)
RDW: 15 % (ref 11.5–15.5)
WBC: 9.6 10*3/uL (ref 4.0–10.5)

## 2013-02-13 LAB — MAGNESIUM: Magnesium: 2.9 mg/dL — ABNORMAL HIGH (ref 1.5–2.5)

## 2013-02-13 LAB — MRSA PCR SCREENING: MRSA by PCR: NEGATIVE

## 2013-02-13 LAB — TSH: TSH: 4.09 u[IU]/mL (ref 0.350–4.500)

## 2013-02-13 LAB — PROTIME-INR
INR: 4.77 — ABNORMAL HIGH (ref 0.00–1.49)
Prothrombin Time: 43.8 seconds — ABNORMAL HIGH (ref 11.6–15.2)

## 2013-02-13 MED ORDER — INSULIN ASPART 100 UNIT/ML ~~LOC~~ SOLN
10.0000 [IU] | Freq: Once | SUBCUTANEOUS | Status: AC
  Administered 2013-02-13: 10 [IU] via INTRAVENOUS

## 2013-02-13 MED ORDER — GABAPENTIN 100 MG PO CAPS
100.0000 mg | ORAL_CAPSULE | Freq: Three times a day (TID) | ORAL | Status: DC
  Filled 2013-02-13 (×3): qty 3

## 2013-02-13 MED ORDER — ADULT MULTIVITAMIN W/MINERALS CH
1.0000 | ORAL_TABLET | Freq: Every day | ORAL | Status: DC
  Administered 2013-02-14 – 2013-02-21 (×7): 1 via ORAL
  Filled 2013-02-13 (×9): qty 1

## 2013-02-13 MED ORDER — HYPROMELLOSE 0.3 % OP SOLN: 1.0000 [drp] | Freq: Two times a day (BID) | OPHTHALMIC | Status: DC | PRN

## 2013-02-13 MED ORDER — TORSEMIDE 20 MG PO TABS
60.0000 mg | ORAL_TABLET | Freq: Two times a day (BID) | ORAL | Status: DC
Start: 2013-02-13 — End: 2013-02-13
  Filled 2013-02-13 (×2): qty 3

## 2013-02-13 MED ORDER — SODIUM CHLORIDE 0.9 % IJ SOLN
3.0000 mL | Freq: Two times a day (BID) | INTRAMUSCULAR | Status: DC
Start: 2013-02-13 — End: 2013-02-21
  Administered 2013-02-13 – 2013-02-21 (×13): 3 mL via INTRAVENOUS

## 2013-02-13 MED ORDER — CALCIUM GLUCONATE 10 % IV SOLN
1.0000 g | Freq: Once | INTRAVENOUS | Status: DC
  Filled 2013-02-13: qty 10

## 2013-02-13 MED ORDER — CALCIUM GLUCONATE 10 % IV SOLN
1.0000 g | Freq: Once | INTRAVENOUS | Status: AC
  Administered 2013-02-13: 1 g via INTRAVENOUS
  Filled 2013-02-13: qty 10

## 2013-02-13 MED ORDER — THERA M PLUS PO TABS: 1.0000 | ORAL_TABLET | Freq: Every day | ORAL | Status: DC

## 2013-02-13 MED ORDER — SODIUM POLYSTYRENE SULFONATE 15 GM/60ML PO SUSP
30.0000 g | Freq: Once | ORAL | Status: AC
  Administered 2013-02-13: 30 g via ORAL
  Filled 2013-02-13: qty 120

## 2013-02-13 MED ORDER — SODIUM CHLORIDE 0.9 % IJ SOLN
3.0000 mL | INTRAMUSCULAR | Status: DC | PRN
  Administered 2013-02-14: 3 mL via INTRAVENOUS
  Administered 2013-02-16: 11:00:00 via INTRAVENOUS

## 2013-02-13 MED ORDER — SODIUM CHLORIDE 0.9 % IV BOLUS (SEPSIS)
250.0000 mL | Freq: Once | INTRAVENOUS | Status: AC
  Administered 2013-02-13: 250 mL via INTRAVENOUS

## 2013-02-13 MED ORDER — DEXTROSE 50 % IV SOLN
25.0000 g | Freq: Once | INTRAVENOUS | Status: AC
  Administered 2013-02-13: 25 g via INTRAVENOUS
  Filled 2013-02-13: qty 50

## 2013-02-13 MED ORDER — SACCHAROMYCES BOULARDII 250 MG PO CAPS
250.0000 mg | ORAL_CAPSULE | Freq: Two times a day (BID) | ORAL | Status: DC
  Administered 2013-02-13 – 2013-02-20 (×15): 250 mg via ORAL
  Filled 2013-02-13 (×18): qty 1

## 2013-02-13 MED ORDER — DIFLUNISAL 500 MG PO TABS
250.0000 mg | ORAL_TABLET | Freq: Two times a day (BID) | ORAL | Status: DC
  Filled 2013-02-13 (×2): qty 1

## 2013-02-13 MED ORDER — FLUTICASONE PROPIONATE 50 MCG/ACT NA SUSP
2.0000 | Freq: Every day | NASAL | Status: DC
Start: 2013-02-13 — End: 2013-02-21
  Administered 2013-02-14 – 2013-02-21 (×8): 2 via NASAL
  Filled 2013-02-13: qty 16

## 2013-02-13 MED ORDER — LOPERAMIDE HCL 2 MG PO CAPS: 2.0000 mg | ORAL_CAPSULE | Freq: Two times a day (BID) | ORAL | Status: DC

## 2013-02-13 MED ORDER — SODIUM CHLORIDE 0.9 % IV SOLN: 250.0000 mL | INTRAVENOUS | Status: DC | PRN

## 2013-02-13 MED ORDER — ALIGN PO CAPS: 1.0000 | ORAL_CAPSULE | Freq: Every day | ORAL | Status: DC

## 2013-02-13 MED ORDER — SODIUM POLYSTYRENE SULFONATE 15 GM/60ML PO SUSP
30.0000 g | Freq: Once | ORAL | Status: AC
  Administered 2013-02-13: 30 g via ORAL
  Filled 2013-02-13 (×2): qty 60

## 2013-02-13 MED ORDER — POTASSIUM CHLORIDE ER 10 MEQ PO TBCR
30.0000 meq | EXTENDED_RELEASE_TABLET | Freq: Two times a day (BID) | ORAL | Status: DC
  Filled 2013-02-13 (×2): qty 3

## 2013-02-13 MED ORDER — ACETAMINOPHEN 325 MG PO TABS
650.0000 mg | ORAL_TABLET | ORAL | Status: DC | PRN
  Administered 2013-02-15 – 2013-02-19 (×3): 650 mg via ORAL
  Filled 2013-02-13 (×2): qty 2
  Filled 2013-02-13: qty 1

## 2013-02-13 MED ORDER — DEXTROSE 50 % IV SOLN
25.0000 g | Freq: Once | INTRAVENOUS | Status: AC
  Administered 2013-02-13: 25 g via INTRAVENOUS

## 2013-02-13 MED ORDER — SIMETHICONE 80 MG PO CHEW
80.0000 mg | CHEWABLE_TABLET | Freq: Two times a day (BID) | ORAL | Status: DC | PRN
  Administered 2013-02-16: 80 mg via ORAL
  Filled 2013-02-13: qty 1

## 2013-02-13 MED ORDER — CALCITRIOL 0.25 MCG PO CAPS
0.2500 ug | ORAL_CAPSULE | Freq: Every day | ORAL | Status: DC
  Administered 2013-02-14 – 2013-02-21 (×8): 0.25 ug via ORAL
  Filled 2013-02-13 (×10): qty 1

## 2013-02-13 MED ORDER — DEXTROSE 50 % IV SOLN
INTRAVENOUS | Status: AC
  Filled 2013-02-13: qty 50

## 2013-02-13 MED ORDER — BEANO PO TABS: 1.0000 | ORAL_TABLET | Freq: Two times a day (BID) | ORAL | Status: DC | PRN

## 2013-02-13 MED ORDER — ONDANSETRON HCL 4 MG/2ML IJ SOLN: 4.0000 mg | Freq: Four times a day (QID) | INTRAMUSCULAR | Status: DC | PRN

## 2013-02-13 MED ORDER — POLYVINYL ALCOHOL 1.4 % OP SOLN
1.0000 [drp] | OPHTHALMIC | Status: DC | PRN
  Filled 2013-02-13: qty 15

## 2013-02-13 MED ORDER — MIDODRINE HCL 5 MG PO TABS
10.0000 mg | ORAL_TABLET | Freq: Three times a day (TID) | ORAL | Status: DC
Start: 2013-02-13 — End: 2013-02-13
  Filled 2013-02-13 (×3): qty 2

## 2013-02-13 MED ORDER — DOCUSATE SODIUM 100 MG PO CAPS
100.0000 mg | ORAL_CAPSULE | ORAL | Status: DC | PRN
  Administered 2013-02-18: 100 mg via ORAL
  Filled 2013-02-13 (×3): qty 1

## 2013-02-13 MED ORDER — PANTOPRAZOLE SODIUM 40 MG PO TBEC
40.0000 mg | DELAYED_RELEASE_TABLET | Freq: Two times a day (BID) | ORAL | Status: DC
  Administered 2013-02-13 – 2013-02-19 (×13): 40 mg via ORAL
  Filled 2013-02-13 (×13): qty 1

## 2013-02-13 MED ORDER — INSULIN ASPART 100 UNIT/ML ~~LOC~~ SOLN
10.0000 [IU] | Freq: Once | SUBCUTANEOUS | Status: AC
  Administered 2013-02-13: 10 [IU] via SUBCUTANEOUS

## 2013-02-13 MED ORDER — WARFARIN - PHARMACIST DOSING INPATIENT
Freq: Every day | Status: DC
  Administered 2013-02-16 – 2013-02-20 (×2)

## 2013-02-13 MED ORDER — SODIUM CHLORIDE 0.9 % IV SOLN
1.0000 g | Freq: Once | INTRAVENOUS | Status: AC
  Administered 2013-02-13: 1 g via INTRAVENOUS
  Filled 2013-02-13: qty 10

## 2013-02-13 NOTE — Patient Instructions (Signed)
You are being admitted to the hospital

## 2013-02-13 NOTE — Progress Notes (Signed)
ANTICOAGULATION CONSULT NOTE - Initial Consult  Pharmacy Consult:  Coumadin Indication:  History of AFib  Allergies  Allergen Reactions  . Statins Other (See Comments)    LEG CRAMPS     Patient Measurements: Height: 5\' 5"  (165.1 cm) Weight: 130 lb 1.1 oz (59 kg) IBW/kg (Calculated) : 57  Vital Signs: Temp: 97.3 F (36.3 C) (06/23 1100) Temp src: Oral (06/23 1100) BP: 106/72 mmHg (06/23 1341) Pulse Rate: 99 (06/23 1341)  Labs:  Recent Labs  02/13/13 1135  HGB 15.2*  HCT 45.0  PLT 232  LABPROT 43.8*  INR 4.77*  CREATININE 1.99*  TROPONINI <0.30    Estimated Creatinine Clearance: 22.7 ml/min (by C-G formula based on Cr of 1.99).   Medical History: Past Medical History  Diagnosis Date  . Lactose intolerance   . IBS (irritable bowel syndrome)   . Dyslipidemia   . 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 Kidney stone     R ureteral stone  . Coronary artery disease   . S/P hemorrhoidectomy   . Arthritis   . Carotid stenosis, bilateral PER DUPLEX  04-10-2011    RICA  40-59%/  LICA 60-79%  . Cardiomyopathy secondary     EF 25% echo 2013  . Atrial fibrillation     chronic anticoag  . Hypercholesterolemia   . Spinal stenosis   . Neuromuscular disorder     neuropathy d/t amyloidosis  . ANXIETY   . DEPRESSION   . Branch retinal vein occlusion of left eye     12/2011 event - vision improving - Following with optho and retinal specialist for same         Assessment: 72 YOF on Coumadin PTA for history of Afib.  INR currently supra-therapeutic; no bleeding reported.   Goal of Therapy:  INR 2-3 Monitor platelets by anticoagulation protocol: Yes    Plan: - Hold Coumadin - Daily PT / INR - Recommend holding diflunisal with rising SCr - F/U gabapentin dosing     Shigeko Manard D. Laney Potash, PharmD, BCPS Pager:  [phone] 02/13/2013, 2:17 PM

## 2013-02-13 NOTE — Progress Notes (Signed)
Report called to [facility], RN on 2900. Call placed to pt's son to update him on room change and pt status.  VSS. Will transfer to 2900 with rapid response RN [employer]. Lynsi Dooner, Avie Echevaria , RN

## 2013-02-13 NOTE — Progress Notes (Signed)
Md aware of low blood pressure 76/50. Physician assistant to round an assess patient. Pt asymptomatic with low BP and heart rate is in the 70's. Will continue to monitor.  Nasim Habeeb, Charlaine Dalton RN

## 2013-02-13 NOTE — Consult Note (Signed)
WOC consult Note Reason for Consult: Leg wound Patient is followed as an outpatient at [facility].  Has had Una boots placed this past Thursday (6/19) and patient states they are not due to be changed until this upcoming Thursday (6/26).  Pt does not wish to have Una boots removed at this time.  Will follow up with patient Thursday if still in hospital to change wraps.   Norva Karvonen RN, MSN Student  Please re-consult if further assistance is needed.  Thank-you,  Cammie Mcgee MSN, RN, CWOCN, [place], CNS [phone]

## 2013-02-13 NOTE — Progress Notes (Signed)
Utilization Review Completed.Miranda [name] T6/23/2014

## 2013-02-13 NOTE — H&P (Signed)
Please see below for Dr. Jenene Slicker admission note which serves as her H&P.    HPI: The patient came to the office today for the add-on. She has an amyloid cardiomyopathy. She has been seen in [place] and most recently by his mom here. She has persistent atrial fibrillation. She has had hypotension which as reduce her ability to titrate her medications. Recently when she saw oncology she was given a prescription for midodrine. However, she didn't want to take this without talking to [place]. On Friday of last week she felt relatively good and actually walked around at an outside market. However, when she got home she was quite weak. That Saturday her legs were very heavy and she was having difficulty walking. Slept all Sunday. She says she's not urinating as much. Her weight is up about 5 pounds. She's not having any new overt PND or orthopnea although she does "notice her breathing more". She's not noticing new tachycardia palpitations. She's not describing presyncope or syncope but she did fall today. She actually had to have EMS to help her up and then her son drove her here. She's had no fevers or chills though she felt a little hot last night. She has had some foul-smelling urine but this isn't persistent. She was told not long ago that she had some chronic smoldering urinary infection but wasn't treated for it. She's had constipation but no diarrhea. She's been eating at her usual.   Allergies   Allergen  Reactions   .  Statins  Other (See Comments)     LEG CRAMPS    Current Outpatient Prescriptions   Medication  Sig  Dispense  Refill   .  Alpha-D-Galactosidase (BEANO PO)  Take 1-2 tablets by mouth 2 (two) times daily as needed. GAS     .  calcitRIOL (ROCALTROL) 0.25 MCG capsule  Take 0.25 mcg by mouth daily.     Marland Kitchen  COUMADIN 2.5 MG tablet  TAKE AS DIRECTED BY COUMADIN CLINIC  30 tablet  3   .  cyanocobalamin (,VITAMIN B-12,) 1000 MCG/ML injection  Inject 1 mL (1,000 mcg total) into the muscle once.   1 mL  0   .  cyclobenzaprine (FLEXERIL) 5 MG tablet  TAKE 1 TABLET BY MOUTH AT BEDTIME AS NEEDED FOR MUSCLE SPASMS  30 tablet  0   .  diflunisal (DOLOBID) 500 MG TABS  TAKE HALF TABLET BY MOUTH TWICE DAILY  60 tablet  3   .  docusate sodium (COLACE) 100 MG capsule  Take 100 mg by mouth as needed. For stool softner     .  esomeprazole (NEXIUM) 40 MG capsule  Take 1 capsule (40 mg total) by mouth 2 (two) times daily.  60 capsule  5   .  fluticasone (FLONASE) 50 MCG/ACT nasal spray  Place 2 sprays into the nose daily.  16 g  2   .  gabapentin (NEURONTIN) 100 MG capsule  TAKE ONE(1) TO THREE(3) CAPSULES BY MOUTH THREE TIMES DAILY  270 capsule  0   .  Hypromellose (GENTEAL) 0.3 % SOLN  Place 1 drop into both eyes 2 (two) times daily as needed. DRY EYES     .  loperamide (IMODIUM) 2 MG capsule  Take 2 mg by mouth 2 (two) times daily.     .  metolazone (ZAROXOLYN) 2.5 MG tablet  Take 1 tablet (2.5 mg total) by mouth daily as needed. For fluid  30 tablet  11   .  midodrine (PROAMATINE)  10 MG tablet  Take 10 mg by mouth 3 (three) times daily.     .  Multiple Vitamins-Minerals (MULTIVITAMINS THER. W/MINERALS) TABS  Take 1 tablet by mouth daily.     .  Nutritional Supplements (CARNATION INSTANT BREAKFAST PO)  Take 1 Package by mouth 2 (two) times daily.     .  potassium chloride (K-DUR) 10 MEQ tablet  Take 10-60 mEq by mouth daily as needed. For fluid.  Take with for every 20mg  torsemide     .  Probiotic Product (ALIGN PO)  Take 1 capsule by mouth daily.     .  Simethicone 180 MG CAPS  Take 1-2 capsules by mouth 2 (two) times daily as needed. GAS     .  torsemide (DEMADEX) 20 MG tablet  Pt to take 40 mg two to time a day pt to take the third dose as needed  180 tablet  11    No current facility-administered medications for this visit.    Past Medical History   Diagnosis  Date   .  Lactose intolerance    .  IBS (irritable bowel syndrome)    .  Dyslipidemia    .  Amyloid disease  dx 09/2009      Familial amyloidosis, AD: CM, periph neuropathy, motor weakness and autonomic GI dysfx   .  Kidney stone      R ureteral stone   .  Coronary artery disease    .  S/P hemorrhoidectomy    .  Arthritis    .  Carotid stenosis, bilateral  PER DUPLEX 04-10-2011     RICA 40-59%/ LICA 60-79%   .  Cardiomyopathy secondary      EF 25% echo 2013   .  Atrial fibrillation      chronic anticoag   .  Hypercholesterolemia    .  Spinal stenosis    .  Neuromuscular disorder      neuropathy d/t amyloidosis   .  ANXIETY    .  DEPRESSION    .  Branch retinal vein occlusion of left eye      12/2011 event - vision improving - Following with optho and retinal specialist for same    Past Surgical History   Procedure  Laterality  Date   .  Hemorrhoid surgery   2002   .  Carpal tunnel release   2005     RIGHT   .  Cysto/ bladder bx and fulgeration   02-21-2007   .  Right sural nerve bx/ right gastrocemius muscle bx   09-25-2009   .  Breast biopsy   1992   .  Appendectomy   1961   .  Extracorporeal shock wave lithotripsy   08-10-2011     RIGHT   .  Cystoscopy   09/2011   .  Tonsillectomy   10-23-11     child   .  Cystoscopy with ureteroscopy   10/28/2011     Procedure: CYSTOSCOPY WITH URETEROSCOPY; Surgeon: Milford Cage, MD; Location: [facility]; Service: Urology; Laterality: Right;    Family History   Problem  Relation  Age of Onset   .  Heart disease  Mother      Heart failure at a later age   .  Arthritis  Mother    .  Arthritis  Father    .  COPD  Other    .  Heart disease       Heart failure at  a later age    History    Social History   .  Marital Status:  Divorced     Spouse Name:  N/A     Number of Children:  N/A   .  Years of Education:  N/A    Occupational History   .  Not on file.    Social History Main Topics   .  Smoking status:  Former Smoker -- 20 years     Types:  Cigarettes     Quit date:  10/28/1990   .  Smokeless tobacco:  Never Used   .  Alcohol Use:  No   .   Drug Use:  No   .  Sexually Active:  Not on file    Other Topics  Concern   .  Not on file    Social History Narrative    The patient is retired. She is divorced. Lives alone. She has 2 children, twins. She smoked 3 packs of cigarettes a day for 20 years but quit > 20 years ago. She occasionally drinks wine.            ROS: As stated in the HPI and negative for all other systems.  PHYSICAL EXAM  BP 82/75  Pulse 84  Ht 5\' 5"  (1.651 m)  Wt 128 lb (58.06 kg)  BMI 21.3 kg/m2  LMP 08/04/1991  GEN: No distress, frail appearing  NECK: Jugular venous distention to the jaw at 90 degrees, waveform within normal limits, carotid upstroke brisk and symmetric, no bruits, no thyromegaly  LUNGS: Clear to auscultation bilaterally  BACK: No CVA tenderness  CHEST: Unremarkable  HEART: S1 and S2 within normal limits, no S3, no clicks, no rubs, no murmurs, irregular  ABD: Positive bowel sounds normal in frequency in pitch, no bruits, no rebound, no guarding, unable to assess midline mass or bruit with the patient seated.  EXT: 2 plus pulses throughout, Moderate bilateral edema in her calfs reduced from previous, no cyanosis no clubbing  NEURO: Diffuse weakness and muscle wasting nonfocal otherwise  SKIN: No rashes, bilateral legs are both bandaged via the wound clinic.  EKG: Pending   ASSESSMENT AND PLAN   Weakness  The patient has had progressive weakness. I'm not sure there is a reversible cause of this. One thing I will check his a possibility of urinary infection. She can get chest x-ray to make sure there is no occult infection there but she's not having any specific cough. She should have routine labs to include TSH. She will need physical therapy consult.   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 I'm not sure that she is overtly volume overloaded but would continue her current oral  regimen for today. (pt reports that she takes 60mg  torsemide bid)  Atrial fibrillation  The patient tolerates this rhythm and rate control and anticoagulation. We will continue with the meds as listed.   Hypotension  I would start Midrin as was prescribed.   CKD  This will be followed closely.   Leg wound  We will need to make sure she stays on schedule for her wound dressings.

## 2013-02-13 NOTE — Progress Notes (Addendum)
Patient evaluated on arrival to floor.  Telemetry with atrial fibrillation with salvos on NSVT  QRS very wide.  Labs done.  K noted to be greater than 7.5.  Repeated to verify   Pateitn last took meds yesterday  Not today.  Admits to not eating much the past couple days.  Patient given 1 amp D50, 10U insulin, 1 amp calcium gluconate. After meds were given patient developed a different  WCT at 160 bpm  BP 80/  Patient nauseated EKG appeared as VT  Anesthesia called.  Because patient had eatten 1 bite food for lunch felt to be aspiration risk.  Discussed with patient  Felt safest to do awake  Patient agreed. Pads placed in AP position.  Patient cardioverted to SR with 150 J synchronized biphasic energy to SR> 12 lead EKG done Patient to be transferred to Step down WIll contact CHF service.  Discussed with J Hochrein.  Miranda [name]

## 2013-02-13 NOTE — Progress Notes (Signed)
HPI The patient came to the office today for the add-on. She has an amyloid cardiomyopathy.  She has been seen in [place] and most recently by his mom here. She has persistent atrial fibrillation. She has had hypotension which as reduce her ability to titrate her medications. Recently when she saw oncology she was given a prescription for midodrine.  However, she didn't want to take this without talking to [place]. On Friday of last week she felt relatively good and actually walked around at an outside market. However, when she got home she was quite weak. That Saturday her legs were very heavy and she was having difficulty walking. Slept all Sunday. She says she's not urinating as much. Her weight is up about 5 pounds. She's not having any new overt PND or orthopnea although she does "notice her breathing more". She's not noticing new tachycardia palpitations. She's not describing presyncope or syncope but she did fall today. She actually had to have EMS to help her up and then her son drove her here.  She's had no fevers or chills though she felt a little hot last night. She has had some foul-smelling urine but this isn't persistent. She was told not long ago that she had some chronic smoldering urinary infection but wasn't treated for it. She's had constipation but no diarrhea. She's been eating at her usual.    Allergies  Allergen Reactions  . Statins Other (See Comments)    LEG CRAMPS     Current Outpatient Prescriptions  Medication Sig Dispense Refill  . Alpha-D-Galactosidase (BEANO PO) Take 1-2 tablets by mouth 2 (two) times daily as needed. GAS       . calcitRIOL (ROCALTROL) 0.25 MCG capsule Take 0.25 mcg by mouth daily.      Marland Kitchen COUMADIN 2.5 MG tablet TAKE AS DIRECTED BY COUMADIN CLINIC  30 tablet  3  . cyanocobalamin (,VITAMIN B-12,) 1000 MCG/ML injection Inject 1 mL (1,000 mcg total) into the muscle once.  1 mL  0  . cyclobenzaprine (FLEXERIL) 5 MG tablet TAKE 1 TABLET BY MOUTH AT BEDTIME AS  NEEDED FOR MUSCLE SPASMS  30 tablet  0  . diflunisal (DOLOBID) 500 MG TABS TAKE HALF TABLET BY MOUTH TWICE DAILY  60 tablet  3  . docusate sodium (COLACE) 100 MG capsule Take 100 mg by mouth as needed. For stool softner      . esomeprazole (NEXIUM) 40 MG capsule Take 1 capsule (40 mg total) by mouth 2 (two) times daily.  60 capsule  5  . fluticasone (FLONASE) 50 MCG/ACT nasal spray Place 2 sprays into the nose daily.  16 g  2  . gabapentin (NEURONTIN) 100 MG capsule TAKE ONE(1) TO THREE(3) CAPSULES BY MOUTH THREE TIMES DAILY  270 capsule  0  . Hypromellose (GENTEAL) 0.3 % SOLN Place 1 drop into both eyes 2 (two) times daily as needed. DRY EYES       . loperamide (IMODIUM) 2 MG capsule Take 2 mg by mouth 2 (two) times daily.       . metolazone (ZAROXOLYN) 2.5 MG tablet Take 1 tablet (2.5 mg total) by mouth daily as needed. For fluid  30 tablet  11  . midodrine (PROAMATINE) 10 MG tablet Take 10 mg by mouth 3 (three) times daily.      . Multiple Vitamins-Minerals (MULTIVITAMINS THER. W/MINERALS) TABS Take 1 tablet by mouth daily.       . Nutritional Supplements (CARNATION INSTANT BREAKFAST PO) Take 1 Package by mouth  2 (two) times daily.        . potassium chloride (K-DUR) 10 MEQ tablet Take 10-60 mEq by mouth daily as needed. For fluid. Take with for every 20mg  torsemide      . Probiotic Product (ALIGN PO) Take 1 capsule by mouth daily.       . Simethicone 180 MG CAPS Take 1-2 capsules by mouth 2 (two) times daily as needed. GAS       . torsemide (DEMADEX) 20 MG tablet Pt to take 40 mg two to time a day pt to take the third dose as needed  180 tablet  11   No current facility-administered medications for this visit.    Past Medical History  Diagnosis Date  . Lactose intolerance   . IBS (irritable bowel syndrome)   . Dyslipidemia   . 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 Kidney stone     R ureteral  stone  . Coronary artery disease   . S/P hemorrhoidectomy   . Arthritis   . Carotid stenosis, bilateral PER DUPLEX  04-10-2011    RICA  40-59%/  LICA 60-79%  . Cardiomyopathy secondary     EF 25% echo 2013  . Atrial fibrillation     chronic anticoag  . Hypercholesterolemia   . Spinal stenosis   . Neuromuscular disorder     neuropathy d/t amyloidosis  . ANXIETY   . DEPRESSION   . Branch retinal vein occlusion of left eye     12/2011 event - vision improving - Following with optho and retinal specialist for same    Past Surgical History  Procedure Laterality Date  . Hemorrhoid surgery  2002  . Carpal tunnel release  2005    RIGHT  . Cysto/ bladder bx and fulgeration  02-21-2007  . Right sural nerve bx/ right gastrocemius muscle bx  09-25-2009  . Breast biopsy  1992  . Appendectomy  1961  . Extracorporeal shock wave lithotripsy  08-10-2011    RIGHT  . Cystoscopy  09/2011  . Tonsillectomy  10-23-11    child  . Cystoscopy with ureteroscopy  10/28/2011    Procedure: CYSTOSCOPY WITH URETEROSCOPY;  Surgeon: Milford Cage, MD;  Location: [facility];  Service: Urology;  Laterality: Right;    Family History  Problem Relation Age of Onset  . Heart disease Mother     Heart failure at a later age  . Arthritis Mother   . Arthritis Father   . COPD Other   . Heart disease      Heart failure at a later age    History   Social History  . Marital Status: Divorced    Spouse Name: N/A    Number of Children: N/A  . Years of Education: N/A   Occupational History  . Not on file.   Social History Main Topics  . Smoking status: Former Smoker -- 20 years    Types: Cigarettes    Quit date: 10/28/1990  . Smokeless tobacco: Never Used  . Alcohol Use: No  . Drug Use: No  . Sexually Active: Not on file   Other Topics Concern  . Not on file   Social History Narrative   The patient is retired.  She is divorced.  Lives alone. She has 2 children, twins.  She smoked 3 packs of  cigarettes a day for 20 years but quit > 20 years ago.  She occasionally drinks wine.             ROS: As stated in the HPI and negative for all other systems.   PHYSICAL EXAM BP 82/75  Pulse 84  Ht 5\' 5"  (1.651 m)  Wt 128 lb (58.06 kg)  BMI 21.3 kg/m2  LMP 08/04/1991 GEN: No distress, frail appearing NECK: Jugular venous distention to the jaw at 90 degrees, waveform within normal limits, carotid upstroke brisk and symmetric, no bruits, no thyromegaly  LUNGS: Clear to auscultation bilaterally  BACK: No CVA tenderness  CHEST: Unremarkable  HEART: S1 and S2 within normal limits, no S3, no clicks, no rubs, no murmurs, irregular  ABD: Positive bowel sounds normal in frequency in pitch, no bruits, no rebound, no guarding, unable to assess midline mass or bruit with the patient seated.  EXT: 2 plus pulses throughout, Moderate bilateral edema in her calfs reduced from previous, no cyanosis no clubbing  NEURO: Diffuse weakness and muscle wasting nonfocal otherwise  SKIN: No rashes, bilateral legs are both bandaged via the wound clinic.   EKG:  Pending  ASSESSMENT AND PLAN  Weakness The patient has had progressive weakness. I'm not sure there is a reversible cause of this. One thing I will check his a possibility of urinary infection. She can get chest x-ray to make sure there is no occult infection there but she's not having any specific cough.  She should have routine labs to include TSH.  She will need physical therapy consult.  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  I'm not sure that she is overtly volume overloaded but would continue her current oral regimen for today.  Atrial fibrillation  The patient tolerates this rhythm and rate control and anticoagulation. We will continue with the meds as listed.   Hypotension  I would start Midrin as was prescribed.  CKD    This will be followed closely.  Leg wound  We will need to make sure she stays on schedule for her wound dressings.

## 2013-02-13 NOTE — Significant Event (Signed)
Rapid Response Event Note  Overview: Time Called: 1255 Arrival Time: 1255 Event Type: Cardiac  Initial Focused Assessment: Patient direct admit from doctor's office.  Lab results, K+ >7.5 WCT  Dr Tenny Craw at Bedside PIV Left arm  Interventions: D50 & Insulin given IV Calcium Gluconate given IV slow push 12 lead EKG done NSL started Right arm Patient became nauseated and hypotensive Emergent Cardioversion by Dr Tenny Craw, anesthesia at bedside 12 lead EKG done Kayexalate given PO  Transferred pt to 2920 via bed with O2 and heart monitor.  Event Summary: Name of Physician Notified: Dr Tenny Craw at 1255    at    Outcome: Transferred (Comment) (2920)     Miranda [name]

## 2013-02-14 DIAGNOSIS — G609 Hereditary and idiopathic neuropathy, unspecified: Secondary | ICD-10-CM

## 2013-02-14 DIAGNOSIS — I472 Ventricular tachycardia: Secondary | ICD-10-CM

## 2013-02-14 DIAGNOSIS — N058 Unspecified nephritic syndrome with other morphologic changes: Secondary | ICD-10-CM

## 2013-02-14 DIAGNOSIS — E875 Hyperkalemia: Secondary | ICD-10-CM

## 2013-02-14 DIAGNOSIS — E8589 Other amyloidosis: Secondary | ICD-10-CM

## 2013-02-14 LAB — CBC
HCT: 39.5 % (ref 36.0–46.0)
RDW: 15.3 % (ref 11.5–15.5)
WBC: 8.3 10*3/uL (ref 4.0–10.5)

## 2013-02-14 LAB — URINALYSIS, ROUTINE W REFLEX MICROSCOPIC
Bilirubin Urine: NEGATIVE
Nitrite: NEGATIVE
Specific Gravity, Urine: 1.017 (ref 1.005–1.030)
Urobilinogen, UA: 0.2 mg/dL (ref 0.0–1.0)
pH: 6.5 (ref 5.0–8.0)

## 2013-02-14 LAB — BASIC METABOLIC PANEL
BUN: 56 mg/dL — ABNORMAL HIGH (ref 6–23)
Chloride: 94 mEq/L — ABNORMAL LOW (ref 96–112)
Chloride: 94 mEq/L — ABNORMAL LOW (ref 96–112)
GFR calc Af Amer: 28 mL/min — ABNORMAL LOW (ref >90)
GFR calc Af Amer: 27 mL/min — ABNORMAL LOW (ref >90)
Potassium: 4.7 mEq/L (ref 3.5–5.1)
Potassium: 4.1 mEq/L (ref 3.5–5.1)
Sodium: 135 mEq/L (ref 135–145)

## 2013-02-14 LAB — PROTIME-INR: Prothrombin Time: 48.4 seconds — ABNORMAL HIGH (ref 11.6–15.2)

## 2013-02-14 LAB — TROPONIN I: Troponin I: <0.3 ng/mL (ref <0.30)

## 2013-02-14 LAB — PRO B NATRIURETIC PEPTIDE: Pro B Natriuretic peptide (BNP): 31427 pg/mL — ABNORMAL HIGH (ref 0–125)

## 2013-02-14 LAB — URINE MICROSCOPIC-ADD ON

## 2013-02-14 MED ORDER — DIFLUNISAL 500 MG PO TABS
250.0000 mg | ORAL_TABLET | Freq: Two times a day (BID) | ORAL | Status: DC
  Administered 2013-02-15: 250 mg via ORAL
  Administered 2013-02-16: 11:00:00 via ORAL
  Administered 2013-02-16 – 2013-02-21 (×10): 250 mg via ORAL
  Filled 2013-02-14 (×4): qty 1

## 2013-02-14 MED ORDER — TORSEMIDE 20 MG PO TABS
60.0000 mg | ORAL_TABLET | Freq: Two times a day (BID) | ORAL | Status: DC
  Administered 2013-02-14: 60 mg via ORAL
  Administered 2013-02-15: 40 mg via ORAL
  Filled 2013-02-14 (×4): qty 3

## 2013-02-14 MED ORDER — GABAPENTIN 100 MG PO CAPS
100.0000 mg | ORAL_CAPSULE | Freq: Every morning | ORAL | Status: DC
  Administered 2013-02-15 – 2013-02-21 (×8): 100 mg via ORAL
  Filled 2013-02-14 (×7): qty 1

## 2013-02-14 MED ORDER — CIPROFLOXACIN HCL 500 MG PO TABS
500.0000 mg | ORAL_TABLET | ORAL | Status: DC
  Administered 2013-02-14 – 2013-02-19 (×6): 500 mg via ORAL
  Filled 2013-02-14 (×7): qty 1

## 2013-02-14 MED ORDER — ADULT MULTIVITAMIN W/MINERALS CH
1.0000 | ORAL_TABLET | Freq: Every day | ORAL | Status: DC
  Administered 2013-02-15: 1 via ORAL
  Filled 2013-02-14: qty 1

## 2013-02-14 MED ORDER — PREDNISOLONE ACETATE 1 % OP SUSP
1.0000 [drp] | Freq: Two times a day (BID) | OPHTHALMIC | Status: DC
  Administered 2013-02-17 – 2013-02-21 (×9): 1 [drp] via OPHTHALMIC
  Filled 2013-02-14: qty 1

## 2013-02-14 MED ORDER — LOPERAMIDE HCL 2 MG PO CAPS
2.0000 mg | ORAL_CAPSULE | ORAL | Status: DC | PRN
  Administered 2013-02-14 – 2013-02-15 (×4): 2 mg via ORAL
  Filled 2013-02-14 (×4): qty 1

## 2013-02-14 MED ORDER — PREDNISOLONE ACETATE 1 % OP SUSP
1.0000 [drp] | Freq: Three times a day (TID) | OPHTHALMIC | Status: AC
Start: 2013-02-14 — End: 2013-02-16
  Administered 2013-02-14 – 2013-02-16 (×8): 1 [drp] via OPHTHALMIC
  Filled 2013-02-14: qty 1

## 2013-02-14 MED ORDER — GABAPENTIN 100 MG PO CAPS
200.0000 mg | ORAL_CAPSULE | ORAL | Status: DC
  Administered 2013-02-14 – 2013-02-20 (×7): 200 mg via ORAL
  Filled 2013-02-14 (×8): qty 2

## 2013-02-14 NOTE — Progress Notes (Signed)
Orthopedic Tech Progress Note Patient Details:  Miranda [name] 13-Jun-1941 [id number]  Ortho Devices Type of Ortho Device: Roland Rack boot Ortho Device/Splint Location: Bilateral unna boots Ortho Device/Splint Interventions: Application   Shawnie Pons 02/14/2013, 3:51 PM

## 2013-02-14 NOTE — Progress Notes (Signed)
Subjective:   Feeling much better today. Denies dyspnea. Still weak. + neuropathic pain. Now in NSR.   Hyperkalemia improved. This am had urinary retention with 600cc PVR. Foley placed. SBP-80-90.      Intake/Output Summary (Last 24 hours) at 02/14/13 1422 Last data filed at 02/14/13 1400  Gross per 24 hour  Intake  839.9 ml  Output    875 ml  Net  -35.1 ml    Current meds: . calcitRIOL  0.25 mcg Oral Daily  . fluticasone  2 spray Each Nare Daily  . multivitamin with minerals  1 tablet Oral Daily  . pantoprazole  40 mg Oral BID  . saccharomyces boulardii  250 mg Oral BID  . sodium chloride  3 mL Intravenous Q12H  . Warfarin - Pharmacist Dosing Inpatient   Does not apply q1800   Infusions:     Objective:  Blood pressure 81/43, pulse 102, temperature 98.7 F (37.1 C), temperature source Oral, resp. rate 18, height 5\' 5"  (1.651 m), weight 64.4 kg (141 lb 15.6 oz), last menstrual period 08/04/1991, SpO2 97.00%. Weight change:   Physical Exam: GEN: No distress, frail appearing  NECK: Supple JVP 9-10, waveform within normal limits, carotid upstroke brisk and symmetric, no bruits, no thyromegaly  LUNGS: Clear to auscultation bilaterally  BACK: No CVA tenderness  CHEST: Unremarkable  HEART: Regular S1 and S2 within normal limits, no S3, no clicks, no rubs, no murmur ABD: soft. NT  mild distension. Good BS EXT: 2 plus pulses throughout, Tr edema. + wounds NEURO: Diffuse weakness and muscle wasting nonfocal otherwise  SKIN: No rashes, wounds dressed  Telemetry:  NSR with PACs 90s  Lab Results: Basic Metabolic Panel:  Recent Labs Lab 02/13/13 0030 02/13/13 1135 02/13/13 1517 02/13/13 1952 02/14/13 0545  NA 135 131* 132* 136 136  K 4.7 >7.5* 6.6* 5.3* 4.1  CL 94* 91* 91* 94* 94*  CO2 29 25 30 29 27   GLUCOSE 84 99 72 155* 77  BUN 59* 57* 58* 58* 56*  CREATININE 1.95* 1.99* 2.16* 2.03* 2.00*  CALCIUM 9.0 9.4 9.4 9.4 8.8  MG  --  2.9*  --   --   --    Liver  Function Tests:  Recent Labs Lab 02/13/13 1135  AST 275*  ALT 186*  ALKPHOS 126*  BILITOT 0.8  PROT 7.0  ALBUMIN 3.3*   No results found for this basename: LIPASE, AMYLASE,  in the last 168 hours No results found for this basename: AMMONIA,  in the last 168 hours CBC:  Recent Labs Lab 02/13/13 1135 02/14/13 0545  WBC 9.6 8.3  NEUTROABS 7.6  --   HGB 15.2* 13.2  HCT 45.0 39.5  MCV 94.9 95.9  PLT 232 191   Cardiac Enzymes:  Recent Labs Lab 02/13/13 1135 02/13/13 1722 02/13/13 2300  TROPONINI <0.30 <0.30 <0.30   BNP: No components found with this basename: POCBNP,  CBG: No results found for this basename: GLUCAP,  in the last 168 hours Microbiology: No results found for this basename: cult   No results found for this basename: CULT, SDES,  in the last 168 hours  Imaging: No results found.   ASSESSMENT:  1. Hyperkalemia - resolved 2. Chronic systolic and diastolic HF due to amyloid cardiomyopathy      --biventricular HF 2/13 LVEF 20-25% RV mod HK 3. Urinary rentention - resolved 4. A/C renal failure, stage III, improving 5. Hypotension, chronic 6. Atrial fib, chronic 7. Ventricular tachycardia s/p DC-CV on 6/23  8. LE wounds   PLAN/DISCUSSION:  Not sure what tggered hyperkalemia but it is better today. No further ventricular ectopy.  She is much improved today. Will restart home meds today. Continue to hold coumadin. Hopefully we can get her home soon. Given poor prognosis, I do not think ICD is indicated.  Will order repeat echo to exclude effusion.   LOS: 1 day    Arvilla Meres, MD 02/14/2013, 2:22 PM

## 2013-02-14 NOTE — Progress Notes (Signed)
ANTICOAGULATION CONSULT NOTE - Follow Up Consult  Pharmacy Consult:  Coumadin Indication:  history of Afib  Allergies  Allergen Reactions  . Statins Other (See Comments)    Leg cramps and restlessness    Patient Measurements: Height: 5\' 5"  (165.1 cm) Weight: 141 lb 15.6 oz (64.4 kg) IBW/kg (Calculated) : 57  Vital Signs: Temp: 97.6 F (36.4 C) (06/24 0758) Temp src: Oral (06/24 0758) BP: 87/47 mmHg (06/24 0758) Pulse Rate: 93 (06/24 0758)  Labs:  Recent Labs  02/13/13 1135 02/13/13 1517 02/13/13 1722 02/13/13 1952 02/13/13 2300 02/14/13 0545  HGB 15.2*  --   --   --   --  13.2  HCT 45.0  --   --   --   --  39.5  PLT 232  --   --   --   --  191  LABPROT 43.8*  --   --   --   --  48.4*  INR 4.77*  --   --   --   --  6.00*  CREATININE 1.99* 2.16*  --  2.03*  --  2.00*  TROPONINI <0.30  --  <0.30  --  <0.30  --     Estimated Creatinine Clearance: 22.5 ml/min (by C-G formula based on Cr of 2).        Assessment: Miranda [name] on Coumadin PTA for history of Afib.  Coumadin held since admission due to elevated INR of 4.77 upon admit.  INR increased to 6 today; no bleeding per RN.   Goal of Therapy:  INR 2-3 Monitor platelets by anticoagulation protocol: Yes    Plan:  - Continue to hold Coumadin - Daily PT / INR - Monitor for bleeding and the need for reversal - F/U resume home meds      Marlen Mollica D. Laney Potash, PharmD, BCPS Pager:  [phone] 02/14/2013, 8:17 AM

## 2013-02-14 NOTE — Consult Note (Signed)
WOC consult Note Reason for Consult: Consult requested for bilat leg wounds. Wound type: Full thickness chronic wounds.  Pt is followed by the outpatient wound care center and wears Una boots which are changed Q week. Measurement: Left leg full thickness wound 1X.3X.2cm, 90% red, 10% yellow, small yellow drainage, no odor. Right outer calf full thickness wounds in 2 locations: 100% red, 2.5X1X.2cm and 2.5X2X.2cm,  small yellow drainage, no odor. Dressing procedure/placement/frequency: Continue present plan of care with hydrofiber dressings and Una boots with coban changed Q week.  Will plan to change Q Tuesday if still in the hospital at that time.  Pt can resume follow-up with the outpatient wound care center after discharge. Called ortho tech to notify of need for [employer]. Please re-consult if further assistance is needed.  Thank-you,  Cammie Mcgee MSN, RN, CWOCN, [place], CNS [phone]

## 2013-02-14 NOTE — Progress Notes (Signed)
Pt decreased urinary output. Felling occasional pressure and discomfort with attempts to urinate. Bedside urinary Bladder scan done per nursing protocol.  > 630cc scanned in bladder. MD notified. Samples sent. Peri care provided & 31fr foley cath incerted per protocol. Will continue to monitor and assess need for catheter.

## 2013-02-15 ENCOUNTER — Ambulatory Visit: Payer: Medicare Other

## 2013-02-15 LAB — BASIC METABOLIC PANEL
Calcium: 8.2 mg/dL — ABNORMAL LOW (ref 8.4–10.5)
Chloride: 95 mEq/L — ABNORMAL LOW (ref 96–112)
Creatinine, Ser: 1.68 mg/dL — ABNORMAL HIGH (ref 0.50–1.10)
GFR calc Af Amer: 34 mL/min — ABNORMAL LOW (ref >90)

## 2013-02-15 LAB — URINE CULTURE

## 2013-02-15 LAB — PROTIME-INR
INR: 3.35 — ABNORMAL HIGH (ref 0.00–1.49)
Prothrombin Time: 32.1 s — ABNORMAL HIGH (ref 11.6–15.2)

## 2013-02-15 MED ORDER — DIPHENHYDRAMINE HCL 25 MG PO CAPS
25.0000 mg | ORAL_CAPSULE | Freq: Every evening | ORAL | Status: DC | PRN
  Administered 2013-02-15: 25 mg via ORAL
  Filled 2013-02-15: qty 1

## 2013-02-15 MED ORDER — POTASSIUM CHLORIDE CRYS ER 20 MEQ PO TBCR
20.0000 meq | EXTENDED_RELEASE_TABLET | Freq: Three times a day (TID) | ORAL | Status: AC
  Administered 2013-02-15 (×3): 20 meq via ORAL
  Filled 2013-02-15 (×3): qty 1

## 2013-02-15 MED ORDER — WARFARIN SODIUM 1 MG PO TABS
1.0000 mg | ORAL_TABLET | Freq: Once | ORAL | Status: AC
  Administered 2013-02-15: 1 mg via ORAL
  Filled 2013-02-15: qty 1

## 2013-02-15 MED ORDER — ALUM & MAG HYDROXIDE-SIMETH 200-200-20 MG/5ML PO SUSP
15.0000 mL | ORAL | Status: DC | PRN
  Administered 2013-02-15: 15 mL via ORAL
  Filled 2013-02-15: qty 30

## 2013-02-15 MED ORDER — TORSEMIDE 20 MG PO TABS
40.0000 mg | ORAL_TABLET | Freq: Two times a day (BID) | ORAL | Status: DC
  Administered 2013-02-15 – 2013-02-17 (×4): 40 mg via ORAL
  Filled 2013-02-15 (×6): qty 2

## 2013-02-15 NOTE — Progress Notes (Signed)
ANTICOAGULATION CONSULT NOTE - Follow Up Consult  Pharmacy Consult:  Coumadin Indication:  history of Afib  Allergies  Allergen Reactions  . Statins Other (See Comments)    Leg cramps and restlessness    Patient Measurements: Height: 5\' 5"  (165.1 cm) Weight: 138 lb 3.7 oz (62.7 kg) IBW/kg (Calculated) : 57  Vital Signs: Temp: 98 F (36.7 C) (06/25 0810) Temp src: Oral (06/25 0331) BP: 85/40 mmHg (06/25 0810)  Labs:  Recent Labs  02/13/13 1135  02/13/13 1722 02/13/13 1952 02/13/13 2300 02/14/13 0545 02/15/13 0650  HGB 15.2*  --   --   --   --  13.2  --   HCT 45.0  --   --   --   --  39.5  --   PLT 232  --   --   --   --  191  --   LABPROT 43.8*  --   --   --   --  48.4* 32.1*  INR 4.77*  --   --   --   --  6.00* 3.35*  CREATININE 1.99*  < >  --  2.03*  --  2.00* 1.68*  TROPONINI <0.30  --  <0.30  --  <0.30  --   --   < > = values in this interval not displayed.  Estimated Creatinine Clearance: 26.8 ml/min (by C-G formula based on Cr of 1.68).        Assessment: 72 YOF on Coumadin PTA for history of Afib.  Coumadin held since admission due to elevated INR of 4.77 upon admit.  INR decreased significantly from 6 to 3.35 without a reversal agent.  Concern that INR might decreased to sub-therapeutic level tomorrow; therefore, will resume Coumadin today at a lower dose, especially in setting of new Cipro therapy.  No bleeding reported.  Home Coumadin dose: 2.5mg  PO daily except 1.25mg  on Mon   Goal of Therapy:  INR 2-3 Monitor platelets by anticoagulation protocol: Yes    Plan:  - Coumadin 1mg  PO today - Daily PT / INR     Tashiba Timoney D. Laney Potash, PharmD, BCPS Pager:  [phone] 02/15/2013, 10:38 AM

## 2013-02-15 NOTE — Progress Notes (Signed)
SUBJECTIVE:  She feels better.  Very tired.  She is not having any acute SOB   PHYSICAL EXAM Filed Vitals:   02/15/13 0200 02/15/13 0331 02/15/13 0500 02/15/13 0810  BP:  90/41  85/40  Pulse:      Temp:  97.4 F (36.3 C)  98 F (36.7 C)  TempSrc:  Oral    Resp: 16 20    Height:      Weight:   138 lb 3.7 oz (62.7 kg)   SpO2:  94%  95%   General:  Frail Lungs:  Few basilar crackles Heart:  RRR Abdomen:  Positive bowel sounds, no rebound no guarding Extremities:  Decreased edema Neuro:  Nonfocal  LABS: Lab Results  Component Value Date   TROPONINI <0.30 02/13/2013   Results for orders placed during the hospital encounter of 02/13/13 (from the past 24 hour(s))  URINALYSIS, ROUTINE W REFLEX MICROSCOPIC     Status: Abnormal   Collection Time    02/14/13 10:47 AM      Result Value Range   Color, Urine YELLOW  YELLOW   APPearance CLEAR  CLEAR   Specific Gravity, Urine 1.017  1.005 - 1.030   pH 6.5  5.0 - 8.0   Glucose, UA NEGATIVE  NEGATIVE mg/dL   Hgb urine dipstick SMALL (*) NEGATIVE   Bilirubin Urine NEGATIVE  NEGATIVE   Ketones, ur NEGATIVE  NEGATIVE mg/dL   Protein, ur TRACE (*) NEGATIVE mg/dL   Urobilinogen, UA 0.2  0.0 - 1.0 mg/dL   Nitrite NEGATIVE  NEGATIVE   Leukocytes, UA MODERATE (*) NEGATIVE  URINE MICROSCOPIC-ADD ON     Status: Abnormal   Collection Time    02/14/13 10:47 AM      Result Value Range   Squamous Epithelial / LPF RARE  RARE   WBC, UA 11-20  <3 WBC/hpf   RBC / HPF 0-2  <3 RBC/hpf   Bacteria, UA MANY (*) RARE  PROTIME-INR     Status: Abnormal   Collection Time    02/15/13  6:50 AM      Result Value Range   Prothrombin Time 32.1 (*) 11.6 - 15.2 seconds   INR 3.35 (*) 0.00 - 1.49  BASIC METABOLIC PANEL     Status: Abnormal   Collection Time    02/15/13  6:50 AM      Result Value Range   Sodium 138  135 - 145 mEq/L   Potassium 2.9 (*) 3.5 - 5.1 mEq/L   Chloride 95 (*) 96 - 112 mEq/L   CO2 31  19 - 32 mEq/L   Glucose, Bld 107 (*) 70  - 99 mg/dL   BUN 45 (*) 6 - 23 mg/dL   Creatinine, Ser 1.61 (*) 0.50 - 1.10 mg/dL   Calcium 8.2 (*) 8.4 - 10.5 mg/dL   GFR calc non Af Amer 29 (*) >90 mL/min   GFR calc Af Amer 34 (*) >90 mL/min    Intake/Output Summary (Last 24 hours) at 02/15/13 0819 Last data filed at 02/15/13 0300  Gross per 24 hour  Intake    720 ml  Output   2425 ml  Net  -1705 ml     ASSESSMENT AND PLAN:  Hyperkalemia:  Now hypokalemic.  I will supplement.   Acute on chronic systolic and diastolic HF:    Good UO.  ProBNP is actually going up.  However, with her hypotension I will cut back on her torsemide slightly.  I do not want an  inotrope as I suspect she would have a dysrhythmia with this.    Atrial fibrillation:  INR now down to 3.35.    Ventricular tachycardia:  Resolved  CKD:  Creat continues to improve.    UTI:  Treated.    Palliative care:  I had a long discussion with her today.  She knows that she will ultimately succumb to this disease process.  However, she does not yet want to think about hospice care.  She would like to try to stay out of the hospital and have good quality of life. She agrees to talk to Palliative care.      Rollene Rotunda 02/15/2013 8:19 AM

## 2013-02-15 NOTE — Evaluation (Signed)
Occupational Therapy Evaluation Patient Details Name: Miranda [name] MRN: [id number] DOB: July 04, 1941 Today's Date: 02/15/2013 Time: 7829-5621 OT Time Calculation (min): 56 min  OT Assessment / Plan / Recommendation Clinical Impression  Pt is a pleasant female admitted with overall weakness and history of heart failure.  She currently presents at a min assist level for selfcare tasks and functional transfers.  Since pt lives alone feel she will benefit from acute care OT to help increase overall strength and independence with ADLS.  Also feel she will likely need short term SNF for rehab since pt will not have 24 hour supervision.    OT Assessment  Patient needs continued OT Services    Follow Up Recommendations  SNF;Supervision/Assistance - 24 hour    Barriers to Discharge Decreased caregiver support pt lives alone  Equipment Recommendations  3 in 1 bedside comode       Frequency  Min 2X/week    Precautions / Restrictions Precautions Precautions: Fall Precaution Comments: Bilateral una boots, neuropathy in her hands and feet. Restrictions Weight Bearing Restrictions: No   Pertinent Vitals/Pain HR 95 BPM, BP 95/53 in sitting, O2 sats 96 % on room air    ADL  Eating/Feeding: Simulated;Independent Where Assessed - Eating/Feeding: Chair Grooming: Simulated;Min guard Where Assessed - Grooming: Supported standing Upper Body Bathing: Simulated;Set up Where Assessed - Upper Body Bathing: Unsupported sitting Lower Body Bathing: Simulated;Minimal assistance Where Assessed - Lower Body Bathing: Supported sit to stand Upper Body Dressing: Simulated;Set up Where Assessed - Upper Body Dressing: Unsupported sitting Lower Body Dressing: Simulated;Minimal assistance Where Assessed - Lower Body Dressing: Supported sit to stand Toilet Transfer: Performed;Minimal assistance Acupuncturist: Comfort height toilet;Grab bars Toileting - Architect and Hygiene:  Simulated;Minimal assistance Where Assessed - Engineer, mining and Hygiene: Sit to stand from 3-in-1 or toilet Equipment Used: Rolling walker;Gait belt Transfers/Ambulation Related to ADLs: Pt ambulated with the RW with min guard assist but needed min assist for sit to stand from a lower chair. ADL Comments: Pt with decreased ability to perform sit to stand from lower chair.  Overall decreased endurance, discussed use of a 3:1 over the toilet to assist with sit to stand.      OT Diagnosis: Generalized weakness  OT Problem List: Decreased strength;Decreased activity tolerance;Impaired balance (sitting and/or standing);Decreased knowledge of use of DME or AE OT Treatment Interventions: Self-care/ADL training;Therapeutic exercise;DME and/or AE instruction;Energy conservation;Therapeutic activities;Balance training;Patient/family education   OT Goals(Current goals can be found in the care plan section) Acute Rehab OT Goals Patient Stated Goal: Pt wants to get stronger and go back home. OT Goal Formulation: With patient Time For Goal Achievement: 03/01/13 Potential to Achieve Goals: Good ADL Goals Pt Will Perform Grooming: with modified independence;standing Pt Will Perform Lower Body Bathing: with supervision;sit to/from stand Pt Will Perform Lower Body Dressing: with supervision;sit to/from stand Pt Will Transfer to Toilet: with modified independence;ambulating;bedside commode Pt Will Perform Toileting - Clothing Manipulation and hygiene: with supervision;sit to/from stand Pt/caregiver will Perform Home Exercise Program: reft upper extremity;both right and left upper extremity;with written HEP provided;with Supervision  Visit Information  Last OT Received On: 02/15/13 Assistance Needed: +1 History of Present Illness: Pt is 72 yr old female with history of diastolic heart failure.  Admitted for progressive weakness and hypokalemia and also with orthostatic hypotension.          Prior Functioning     Home Living Family/patient expects to be discharged to:: Private residence Living Arrangements: Alone Available Help at  Discharge: Other (Comment) (has a son and pt's sister all visit occasionally) Type of Home: House Home Access: Stairs to enter [employer] of Steps: 2     one step onto the porch and second step up into the house Home Layout: One level Home Equipment: Grab bars - tub/shower;Walker - 4 wheels;Walker - 2 wheels Additional Comments: has raised arms on one of the toilets to help with sit to stand. Used 4 wheeled walker in the house and out in the community.  But to get from house to car she used a 2 wheeled walker. Prior Function Level of Independence: Needs assistance;Independent with assistive device(s) ADL's / Homemaking Assistance Needed: Pt has a housekeeper for cleaning every other week Communication Communication: No difficulties Dominant Hand: Right         Vision/Perception Vision - History Baseline Vision: Wears glasses all the time Patient Visual Report: No change from baseline Vision - Assessment Eye Alignment: Within Functional Limits Vision Assessment: Vision not tested Perception Perception: Within Functional Limits Praxis Praxis: Intact   Cognition  Cognition Arousal/Alertness: Awake/alert Behavior During Therapy: WFL for tasks assessed/performed Overall Cognitive Status: Within Functional Limits for tasks assessed    Extremity/Trunk Assessment Upper Extremity Assessment Upper Extremity Assessment: Generalized weakness Cervical / Trunk Assessment Cervical / Trunk Assessment: Normal     Mobility Transfers Transfers: Sit to Stand;Stand to Sit Sit to Stand: With upper extremity assist;From toilet;4: Min assist Stand to Sit: 4: Min assist;With upper extremity assist;To chair/3-in-1        Balance Balance Balance Assessed: Yes Static Standing Balance Static Standing - Level of Assistance: 4: Min  assist   End of Session OT - End of Session Equipment Utilized During Treatment: Gait belt;Rolling walker Activity Tolerance: Patient limited by fatigue Patient left: in chair Nurse Communication: Mobility status     Alvia Tory OTR/L Pager number [phone] 02/15/2013, 12:50 PM

## 2013-02-15 NOTE — Progress Notes (Signed)
Thank you for consulting the Palliative Medicine Team at [facility] to meet your patient's and family's needs.   The reason that you asked [place] to see your patient is for goals of care discussion  We have scheduled your patient for a meeting: Tomorrow, Thursday 6/26 @ 11:30 am  The Surrogate decision maker is: patient - [name] stated she is making her own decisions however both her son and daughter have joint HCPOA if needed; she will inform her son, Rosanne Ashing of meeting time and he will be present if available  Other family members that need to be present: son Rosanne Ashing if he can be present   Your patient is able/unable to participate: able to participate   Valente David, RN 02/15/2013, 12:21 PM Palliative Medicine Team RN Liaison [phone]

## 2013-02-15 NOTE — Evaluation (Signed)
Physical Therapy Evaluation Patient Details Name: Miranda [name] MRN: [id number] DOB: June 17, 1941 Today's Date: 02/15/2013 Time: 4098-1191 PT Time Calculation (min): 33 min  PT Assessment / Plan / Recommendation History of Present Illness  72 y.o. female admitted to [facility] for hyperkalemia, acute on chronic diastolic and systolic heart failure, UTI, and weakness.    Clinical Impression  The pt presents with significant h/o falls at home by herself.  Self reported bil knee buckling and peripheral neuropathy.  She is generally deconditioned now and would benefit from 24 hour assist at discharge.  Pt is willing to pursue SNF placement for rehab.     PT Assessment  Patient needs continued PT services    Follow Up Recommendations  SNF    Does the patient have the potential to tolerate intense rehabilitation     NA  Barriers to Discharge Decreased caregiver support Pt doesn't want to ask family to stay with her, but knows that PT recommends 24 hour assist at discharge.      Equipment Recommendations  None recommended by PT    Recommendations for Other Services   none  Frequency Min 2X/week    Precautions / Restrictions Precautions Precautions: Fall Precaution Comments: Bilateral una boots, neuropathy in her hands and feet.  Significant history of frequent falls (two this year so far).   Restrictions Weight Bearing Restrictions: No   Pertinent Vitals/Pain See vitals flow sheet.       Mobility  Bed Mobility Bed Mobility: Not assessed Transfers Transfers: Sit to Stand;Stand to Sit Sit to Stand: 5: Supervision;With upper extremity assist;With armrests;From chair/3-in-1 Stand to Sit: 5: Supervision;With upper extremity assist;With armrests;To chair/3-in-1 Details for Transfer Assistance: supervision, pt relying heavily on armrests.  We discussed the benefits and pitfalls to getting a lift recliner chair as pt reports she sleeps in her recliner at home.    Ambulation/Gait Ambulation/Gait Assistance: 4: Min assist Ambulation Distance (Feet): 60 Feet Assistive device: Rolling walker Ambulation/Gait Assistance Details: pt usually uses a 4 wheeled RW with a seat at home and was having difficulty steering a standard RW.  Pt reports her legs buckle on her at home and her last almost fall was because she could not get turned around quickly enough to sit on the seat on her RW.   Gait Pattern: Step-through pattern;Right steppage;Left steppage;Trunk flexed Gait velocity: less than 1.8 ft/sec indicating risk for recurrent falls.      Exercises General Exercises - Lower Extremity Ankle Circles/Pumps: AROM;Both;10 reps;Seated (encouraged them while sitting with legs down for edema )   PT Diagnosis: Difficulty walking;Abnormality of gait;Generalized weakness  PT Problem List: Decreased strength;Decreased activity tolerance;Decreased balance;Decreased mobility;Impaired sensation PT Treatment Interventions: DME instruction;Gait training;Functional mobility training;Therapeutic activities;Therapeutic exercise;Balance training;Neuromuscular re-education;Patient/family education     PT Goals(Current goals can be found in the care plan section) Acute Rehab PT Goals Patient Stated Goal: to get strong enough to go back to living on her own.   PT Goal Formulation: With patient Time For Goal Achievement: 03/01/13 Potential to Achieve Goals: Good  Visit Information  Last PT Received On: 02/15/13 Assistance Needed: +1 History of Present Illness: 72 y.o. female admitted to [facility] for hyperkalemia, acute on chronic diastolic and systolic heart failure, UTI, and weakness.         Prior Functioning  Home Living Family/patient expects to be discharged to:: Private residence (vs SNF) Living Arrangements: Alone Available Help at Discharge: Other (Comment) (has a son and pt's sister all visit occasionally) Type of Home: House  Home Access: Stairs to enter Entrance  Stairs-Number of Steps: 2     one step onto the porch and second step up into the house Home Layout: One level Home Equipment: Grab bars - tub/shower;Walker - 4 wheels;Walker - 2 wheels Additional Comments: has raised arms on one of the toilets to help with sit to stand. Used 4 wheeled walker in the house and out in the community.  But to get from house to car she used a 2 wheeled walker. Prior Function Level of Independence: Needs assistance;Independent with assistive device(s) ADL's / Homemaking Assistance Needed: Pt has a housekeeper for cleaning every other week Communication Communication: No difficulties Dominant Hand: Right    Cognition  Cognition Arousal/Alertness: Awake/alert Behavior During Therapy: WFL for tasks assessed/performed Overall Cognitive Status: Within Functional Limits for tasks assessed    Extremity/Trunk Assessment Upper Extremity Assessment Upper Extremity Assessment: Defer to OT evaluation Lower Extremity Assessment Lower Extremity Assessment: Generalized weakness Cervical / Trunk Assessment Cervical / Trunk Assessment: Normal   Balance Balance Balance Assessed: Yes Static Standing Balance Static Standing - Level of Assistance: 4: Min assist  End of Session PT - End of Session Activity Tolerance: Patient limited by fatigue Patient left: in chair;with call bell/phone within reach Nurse Communication: Mobility status;Other (comment) (d/c recs, ok to walk in hall with son)    Lurena Joiner B. Adalynne Steffensmeier, PT, DPT [phone]   02/15/2013, 2:28 PM

## 2013-02-16 DIAGNOSIS — Z515 Encounter for palliative care: Secondary | ICD-10-CM

## 2013-02-16 DIAGNOSIS — R5383 Other fatigue: Secondary | ICD-10-CM

## 2013-02-16 LAB — BASIC METABOLIC PANEL
BUN: 36 mg/dL — ABNORMAL HIGH (ref 6–23)
Calcium: 8.2 mg/dL — ABNORMAL LOW (ref 8.4–10.5)
Creatinine, Ser: 1.3 mg/dL — ABNORMAL HIGH (ref 0.50–1.10)
GFR calc Af Amer: 46 mL/min — ABNORMAL LOW (ref >90)
GFR calc non Af Amer: 40 mL/min — ABNORMAL LOW (ref >90)
Potassium: 3 mEq/L — ABNORMAL LOW (ref 3.5–5.1)

## 2013-02-16 LAB — PROTIME-INR: Prothrombin Time: 21.9 seconds — ABNORMAL HIGH (ref 11.6–15.2)

## 2013-02-16 MED ORDER — WARFARIN SODIUM 2.5 MG PO TABS
2.5000 mg | ORAL_TABLET | Freq: Once | ORAL | Status: AC
  Administered 2013-02-16: 2.5 mg via ORAL
  Filled 2013-02-16 (×2): qty 1

## 2013-02-16 MED ORDER — POTASSIUM CHLORIDE CRYS ER 20 MEQ PO TBCR
20.0000 meq | EXTENDED_RELEASE_TABLET | Freq: Three times a day (TID) | ORAL | Status: AC
  Administered 2013-02-16 (×3): 20 meq via ORAL
  Filled 2013-02-16 (×3): qty 1

## 2013-02-16 NOTE — Consult Note (Addendum)
Patient NW:[id number] R Moen      DOB: 1941/07/28      YQM:[id number]     Consult Note from the Palliative Medicine Team at [facility]    Consult Requested by: Dr. 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  She presented to the [facility] Cardiology with chief complaint of worsening weakness, and shortness.she also to decrease in her urination, any palpitations. Patient was seen in the office and found to have possible urinary tract infection and laboratories revealed hyperkalemia, with exacerbation of her heart failure. She also had urinary retention related to urinary tract infection. We were able to meet with the patient and her son just tried to start a discussion about palliative processes for her as it appears that other organs are now becoming involved with her amyloidosis. The patient was hesitant to discuss palliative processes as she was concerned about being forced into hospice care which she states she's not ready for yet. Please note the following from my previous summary :"Patient's opening states she understands her prognosis and had a good talk with Dr. Antoine Poche. She states that she she doesn't know a lot about Palliative Care but she knows that she "doesn't want hospice yet". She equates the two because of the sign on [address]. We discussed the concept of palliative care and identified where in our community services at present are available (hospital and LTC). We then identified her concern related to hospice which was that she is not dying YET and so she could not see herself using their services. We corrected her view of hospice with gentle bits of information and encouraged her just to consider a visit with them to see what their services really would mean for her. We identified her top priorities at  this time as being independent and living in her home as long as possible. Were able to talk about planning for the future. She has done an HCPOA and had started a living will but had trouble actually talking out her wishes with her children as their was some element of contention with her daughter. We identified this a something that would be good to do with her children and encouraged her to talk over her code status with Dr. Antoine Poche, or offered to continue the conversation while she is her. At this time by default she is a Full Code. She stated "yes I have to figure out that DNR thing". We provided her with a Hard Choices Book and a MOST form to review.  We spoke with [facility] care management to see if the could offer support in this time period as an adjunct to home health. We will ask the social worker to provide the lists for home health and other services. I mentioned that if she transitions to rehab palliative care services could continue to have this conversation with her. "   I have offered to revisit the patient while she is in the hospital to answer any questions or talked hearing he thinks it may have come to mind. I will update her primary physician and her cardiologist on the above interactions.   Goals of Care: 1.  Code Status: Full code   2. Scope of Treatment: 1. Laboratory draws. At this time the patient would like to continue monitoring her Coumadin levels and her renal  function. She is requesting potential services at home as it is difficult for her to get out of the office. We will inquire as to her eligibility for home health services on discharge.  2.  neuropathic pain. At this time she states that her pain is reasonably controlled with her Neurontin would recommend continuing this at discharge 3. Advanced directive planning. We'll update her primary physician and Dr. Antoine Poche who can continue to have conversations with her about her CODE STATUS. I provided her with a hard choices book  and discussed this in the presence of her son who can continue to have this conversation with her.  4. Chronic leg wound continue recommendations per wound care 5. Hypotension : Patient will be started on measure drain per cardiology's last note  6.  urinary tract infection: Culture showing multiple organisms might consider re collection of the specimen if she is not responding to current antibiotic therapy. 7. Case management for [facility] and would be a helpful adjunct if she qualifies we will request evaluation.   4. Disposition: Patient desires to go home with maximal community services. If she does not do well enough to go home and decide on the skilled nursing facility placement please consider palliative care consult to help follow her for future needs.    3. Symptom Management:   1. Anxiety/Agitation: Emotional support during this difficult period of time  2. Pain: Continue treatment with Neurontin for her neuropathic pain 3. Respiratory distress resolved with treatment of her chronic heart failure and urinary retention   4. Psychosocial: patient has 2 children a son named Rosanne Ashing and the daughter name Donnald Garre, her daughter-in-law is an Charity fundraiser who is also active in her care. 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 She retired as she stated abruptly when the job was terminated last year she reports herself as a very reactive woman who enjoys gardening and reading until becoming weaker is really a challenge for her.   5. Spiritual: the patient does report spiritual support in her community, but did not expound.         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 She presented to the cardiology office with generalized weakness and hypotension as well as urinary  symptoms. She was admitted to the hospital for findings of hyperkalemia and urinary retention with urinary tract infection. We were asked to assist with goals of care    ROS: Generally weak, short of breath, cool, appetite intact     PMH:  Past Medical History  Diagnosis Date  . Lactose intolerance   . IBS (irritable bowel syndrome)   . Dyslipidemia   . 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 Kidney stone     R ureteral stone  . Coronary artery disease   . S/P hemorrhoidectomy   . Arthritis   . Carotid stenosis, bilateral PER DUPLEX  04-10-2011    RICA  40-59%/  LICA 60-79%  . Cardiomyopathy secondary     EF 25% echo 2013  . Atrial fibrillation     chronic anticoag  . Hypercholesterolemia   . Spinal stenosis   . Neuromuscular disorder  neuropathy d/t amyloidosis  . ANXIETY   . DEPRESSION   . Branch retinal vein occlusion of left eye     12/2011 event - vision improving - Following with optho and retinal specialist for same     PSH: Past Surgical History  Procedure Laterality Date  . Hemorrhoid surgery  2002  . Carpal tunnel release  2005    RIGHT  . Cysto/ bladder bx and fulgeration  02-21-2007  . Right sural nerve bx/ right gastrocemius muscle bx  09-25-2009  . Breast biopsy  1992  . Appendectomy  1961  . Extracorporeal shock wave lithotripsy  08-10-2011    RIGHT  . Cystoscopy  09/2011  . Tonsillectomy  10-23-11    child  . Cystoscopy with ureteroscopy  10/28/2011    Procedure: CYSTOSCOPY WITH URETEROSCOPY;  Surgeon: Milford Cage, MD;  Location: [facility];  Service: Urology;  Laterality: Right;  . Eye surgery     I have reviewed the FH and SH and  If appropriate update it with new information. Allergies  Allergen Reactions  . Statins Other (See Comments)    Leg cramps and restlessness   Scheduled Meds: . calcitRIOL  0.25 mcg Oral Daily  . ciprofloxacin  500 mg Oral Q24H   . diflunisal  250 mg Oral BID  . fluticasone  2 spray Each Nare Daily  . gabapentin  100 mg Oral q morning - 10a  . gabapentin  200 mg Oral Q24H  . multivitamin with minerals  1 tablet Oral Daily  . pantoprazole  40 mg Oral BID  . potassium chloride  20 mEq Oral TID  . prednisoLONE acetate  1 drop Right Eye TID   Followed by  . [START ON 02/17/2013] prednisoLONE acetate  1 drop Right Eye BID  . saccharomyces boulardii  250 mg Oral BID  . sodium chloride  3 mL Intravenous Q12H  . torsemide  40 mg Oral BID  . warfarin  2.5 mg Oral ONCE-1800  . Warfarin - Pharmacist Dosing Inpatient   Does not apply q1800   Continuous Infusions:  PRN Meds:.sodium chloride, acetaminophen, alum & mag hydroxide-simeth, diphenhydrAMINE, docusate sodium, loperamide, ondansetron (ZOFRAN) IV, polyvinyl alcohol, simethicone, sodium chloride    BP 126/73  Pulse 90  Temp(Src) 98.5 F (36.9 C) (Oral)  Resp 19  Ht 5\' 5"  (1.651 m)  Wt 61.6 kg (135 lb 12.9 oz)  BMI 22.6 kg/m2  SpO2 95%  LMP 08/04/1991   PPS:40 %   Intake/Output Summary (Last 24 hours) at 02/16/13 1324 Last data filed at 02/16/13 0930  Gross per 24 hour  Intake    893 ml  Output   1850 ml  Net   -957 ml   LBM:  6/23 /14                   Physical Exam:  General: Minimal distress secondary to back pain which is chronic. HEENT:  Normocephalic atraumatic, pupils equal reactive to light extra ocular muscles are intact mucous membranes are moist neck is supple there's no JVD no lymph nodes or carotid bruits  Chest:   Decreased but clear to auscultation and don't appreciate any murmurs or wheezes CVS:  irregular positive S1 and S2 no S3-S4 no murmurs rubs or gallops Abdomen: Soft scaphoid nontender Ext: Likely not examined Neuro: Awake alert oriented cranial nerves II through XII are intact power is 5 over 5  Labs: CBC    Component Value Date/Time   WBC 8.3 02/14/2013 0545  RBC 4.12 02/14/2013 0545   HGB 13.2 02/14/2013 0545   HCT  39.5 02/14/2013 0545   PLT 191 02/14/2013 0545   MCV 95.9 02/14/2013 0545   MCH 32.0 02/14/2013 0545   MCHC 33.4 02/14/2013 0545   RDW 15.3 02/14/2013 0545   LYMPHSABS 1.3 02/13/2013 1135   MONOABS 0.6 02/13/2013 1135   EOSABS 0.0 02/13/2013 1135   BASOSABS 0.0 02/13/2013 1135      CMP     Component Value Date/Time   NA 138 02/16/2013 0410   K 3.0* 02/16/2013 0410   CL 96 02/16/2013 0410   CO2 33* 02/16/2013 0410   GLUCOSE 102* 02/16/2013 0410   BUN 36* 02/16/2013 0410   CREATININE 1.30* 02/16/2013 0410   CALCIUM 8.2* 02/16/2013 0410   PROT 7.0 02/13/2013 1135   ALBUMIN 3.3* 02/13/2013 1135   AST 275* 02/13/2013 1135   ALT 186* 02/13/2013 1135   ALKPHOS 126* 02/13/2013 1135   BILITOT 0.8 02/13/2013 1135   GFRNONAA 40* 02/16/2013 0410   GFRAA 46* 02/16/2013 0410      Time In Time Out Total Time Spent with Patient Total Overall Time  1145 am 100 pm 105 min 105 min    Greater than 50%  of this time was spent counseling and coordinating care related to the above assessment and plan.   Mikisha Roseland L. Ladona Ridgel, MD MBA The Palliative Medicine Team at [facility] Phone: [phone] Pager: [phone]

## 2013-02-16 NOTE — Progress Notes (Signed)
Client removed tape from area above outer upper elbow region, skin tear noted with bruising.  Meteplex applied, dated and initialed.  C/o being cold, warm blankets wrapped around her and temperature in room adjusted.

## 2013-02-16 NOTE — Progress Notes (Signed)
   SUBJECTIVE:  She feels better.  She got some sleep.  Weak but no SOB. She walked with PT.    PHYSICAL EXAM Filed Vitals:   02/16/13 0500 02/16/13 0545 02/16/13 0600 02/16/13 0745  BP:  93/46 85/43 90/54   Pulse:  66  70  Temp:    98.2 F (36.8 C)  TempSrc:    Oral  Resp:  15 16 16   Height:      Weight: 135 lb 12.9 oz (61.6 kg)     SpO2:    94%   General:  Frail Lungs:  Few basilar crackles improved Heart:  RRR Abdomen:  Positive bowel sounds, no rebound no guarding Extremities:  Decreased edema Neuro:  Nonfocal  LABS: Lab Results  Component Value Date   TROPONINI <0.30 02/13/2013   Results for orders placed during the hospital encounter of 02/13/13 (from the past 24 hour(s))  PROTIME-INR     Status: Abnormal   Collection Time    02/16/13  4:10 AM      Result Value Range   Prothrombin Time 21.9 (*) 11.6 - 15.2 seconds   INR 1.98 (*) 0.00 - 1.49  BASIC METABOLIC PANEL     Status: Abnormal   Collection Time    02/16/13  4:10 AM      Result Value Range   Sodium 138  135 - 145 mEq/L   Potassium 3.0 (*) 3.5 - 5.1 mEq/L   Chloride 96  96 - 112 mEq/L   CO2 33 (*) 19 - 32 mEq/L   Glucose, Bld 102 (*) 70 - 99 mg/dL   BUN 36 (*) 6 - 23 mg/dL   Creatinine, Ser 1.91 (*) 0.50 - 1.10 mg/dL   Calcium 8.2 (*) 8.4 - 10.5 mg/dL   GFR calc non Af Amer 40 (*) >90 mL/min   GFR calc Af Amer 46 (*) >90 mL/min    Intake/Output Summary (Last 24 hours) at 02/16/13 0839 Last data filed at 02/16/13 0600  Gross per 24 hour  Intake    993 ml  Output   2250 ml  Net  -1257 ml     ASSESSMENT AND PLAN:  Hyperkalemia:  Supplement again today.   Acute on chronic systolic and diastolic HF:    Good UO.  ProBNP is actually going up.  I will continue the current dose of torsemide. I do not want an inotrope as I suspect she would have a dysrhythmia with this.    Atrial fibrillation:  INR now down to started warfarin  Ventricular tachycardia:  Resolved.  Check EKG.   CKD:  Creat  continues to improve.    UTI:  Treated.  Day 3 of antibiotics.  D/C Foley.  Palliative care:  I appreciate the team approach Palliative/OT/PT.  She and her son are meeting with palliative care today.  She would prefer to go home with home health but is considering rehab.     Rollene Rotunda 02/16/2013 8:39 AM

## 2013-02-16 NOTE — Consult Note (Signed)
Patient Miranda [name]      DOB: December 15, 1940      NFA:[id number]  Summary of Goals of Care, full note to follow:  Met with Patient and her son Miranda [name]    Patient opening states she understands her prognosis and had a good talk with Dr. Antoine Poche.  She states that she she doesn't know a lot about Palliative Care but she knows that she "doesn't want hospice yet".  She equates the two because of the sign on [address].  We discussed the concept of palliative care and identified where in our community services at present are available (hospital and LTC).  We then identified her concern related to hospice which was that she is not dying YET and so she could not see herself using their services.  We corrected her view of hospice with gentle bits of information and encouraged her just to consider a visit with them to see what their services really would me for her.  We identified her top priorities at this time as being independent and living in her home as long as possible.  Were able to talk about planning for the future.  She has done an HCPOA and had started a living will but had trouble actually talking out her wishes with her children as their was some element of contention with her daughter.  We identified this a something that would be good to do with her children and encouraged her to talk over her code status with Dr. Antoine Poche, or offered to continue the conversation while she is her.   At this time by default she is a Full Code.  She stated "yes I have to figure out that DNR thing".  We provided her with a Hard Choices Book and a MOST form to review.  We spoke with [facility] care management to see if the could offer support in this time period as an adjunct to home health.  We will ask the social worker to provide the lists for home health and other services.  I mentioned that if she transitions to rehab palliative care services could continue to have this conversation with her.   We will revisit during the  rest of her hospital stay to help talk thorough these matters.  Currently,  Her biggest symptom issues are dry mouth which is chronic,  Neuropathy which is controlled with her neurontin,  And generalized weakness.   Updated on call Cardiology on the status of the patient's goals.      Total time:  1145- 100 pm  Miranda Millican L. Ladona Ridgel, MD MBA The Palliative Medicine Team at [facility] Phone: [phone] Pager: [phone]

## 2013-02-16 NOTE — Progress Notes (Signed)
ANTICOAGULATION CONSULT NOTE - Follow Up Consult  Pharmacy Consult:  Coumadin Indication:  history of Afib  Allergies  Allergen Reactions  . Statins Other (See Comments)    Leg cramps and restlessness   Patient Measurements: Height: 5\' 5"  (165.1 cm) Weight: 135 lb 12.9 oz (61.6 kg) IBW/kg (Calculated) : 57  Vital Signs: Temp: 98.2 F (36.8 C) (06/26 0745) Temp src: Oral (06/26 0745) BP: 90/54 mmHg (06/26 0745) Pulse Rate: 70 (06/26 0745)  Labs:  Recent Labs  02/13/13 1135  02/13/13 1722  02/13/13 2300 02/14/13 0545 02/15/13 0650 02/16/13 0410  HGB 15.2*  --   --   --   --  13.2  --   --   HCT 45.0  --   --   --   --  39.5  --   --   PLT 232  --   --   --   --  191  --   --   LABPROT 43.8*  --   --   --   --  48.4* 32.1* 21.9*  INR 4.77*  --   --   --   --  6.00* 3.35* 1.98*  CREATININE 1.99*  < >  --   < >  --  2.00* 1.68* 1.30*  TROPONINI <0.30  --  <0.30  --  <0.30  --   --   --   < > = values in this interval not displayed.  Estimated Creatinine Clearance: 34.7 ml/min (by C-G formula based on Cr of 1.3).  Assessment: 72 YOF on Coumadin PTA for history of Afib.  Coumadin held since admission due to elevated INR upon admit.  INR decreased significantly from 6 to 3.35 without a reversal agent.  INR now dropping fast, will be in "catch up" mode most likely for next couple of days.  His INR today is 1.98 after one dose of 1mg .  No bleeding reported.  Will resume home dose at 2.5mg  today but watch for drug/drug interaction with Cipro.    Home Coumadin dose: 2.5mg  PO daily except 1.25mg  on Mon  Goal of Therapy:  INR 2-3 Monitor platelets by anticoagulation protocol: Yes   Plan:  - Coumadin 2.5 mg PO today - Daily PT / INR  Nadara Mustard, PharmD., MS Clinical Pharmacist Pager:  [phone] Thank you for allowing pharmacy to be part of this patients care team. 02/16/2013, 8:36 AM

## 2013-02-16 NOTE — Progress Notes (Signed)
Thank you to Dr Ladona Ridgel with Palliative Care for this referral.  Patient has transferred to 4730 from 2920.  She indicated that she may go to rehabilitation at discharge.  She expressed that she feels overwhelmed at this time.  I did not proceed with multiple service details for this reason.  Writer will collaborate with inpatient CM, [facility], and her attending physician to develop a long range plan to reduce her readmission risk while managing her quality of life.  Will continue to monitor.  Of note, [facility] Care Management services does not replace or interfere with any services that are arranged by inpatient case management or social work.  For additional questions or referrals please contact Anibal Henderson BSN RN [facility] Liaison at [phone].

## 2013-02-17 DIAGNOSIS — I5042 Chronic combined systolic (congestive) and diastolic (congestive) heart failure: Secondary | ICD-10-CM

## 2013-02-17 LAB — BASIC METABOLIC PANEL
BUN: 28 mg/dL — ABNORMAL HIGH (ref 6–23)
Calcium: 8.5 mg/dL (ref 8.4–10.5)
Creatinine, Ser: 1.25 mg/dL — ABNORMAL HIGH (ref 0.50–1.10)
GFR calc non Af Amer: 42 mL/min — ABNORMAL LOW (ref >90)
Glucose, Bld: 105 mg/dL — ABNORMAL HIGH (ref 70–99)

## 2013-02-17 MED ORDER — POTASSIUM CHLORIDE CRYS ER 10 MEQ PO TBCR
20.0000 meq | EXTENDED_RELEASE_TABLET | Freq: Two times a day (BID) | ORAL | Status: DC
  Administered 2013-02-17 – 2013-02-21 (×9): 20 meq via ORAL
  Filled 2013-02-17 (×12): qty 1

## 2013-02-17 MED ORDER — TORSEMIDE 20 MG PO TABS
60.0000 mg | ORAL_TABLET | Freq: Two times a day (BID) | ORAL | Status: DC
  Administered 2013-02-17 – 2013-02-19 (×5): 60 mg via ORAL
  Filled 2013-02-17 (×8): qty 3

## 2013-02-17 MED ORDER — POTASSIUM CHLORIDE CRYS ER 20 MEQ PO TBCR
EXTENDED_RELEASE_TABLET | ORAL | Status: AC
  Administered 2013-02-17: 20 meq
  Filled 2013-02-17: qty 1

## 2013-02-17 MED ORDER — WARFARIN SODIUM 2.5 MG PO TABS
2.5000 mg | ORAL_TABLET | Freq: Once | ORAL | Status: AC
  Administered 2013-02-17: 2.5 mg via ORAL
  Filled 2013-02-17: qty 1

## 2013-02-17 NOTE — Progress Notes (Signed)
   SUBJECTIVE:  She is tired but has no SOB.  No pain.    PHYSICAL EXAM Filed Vitals:   02/16/13 1300 02/16/13 1500 02/16/13 2001 02/17/13 0543  BP: 111/62 96/66 91/53  91/48  Pulse:  77 89 87  Temp:  98 F (36.7 C) 98.3 F (36.8 C) 98 F (36.7 C)  TempSrc:  Oral Oral Oral  Resp: 16 18 18 18   Height:  5\' 3"  (1.6 m)    Weight:  133 lb (60.328 kg)  132 lb 1.6 oz (59.92 kg)  SpO2:  96% 98% 96%   General:  Frail Lungs:  Few basilar crackles improved Heart:  RRR Abdomen:  Positive bowel sounds, no rebound no guarding Extremities:  Edema in the thighs and knees.   Neuro:  Nonfocal  LABS: Lab Results  Component Value Date   TROPONINI <0.30 02/13/2013   Results for orders placed during the hospital encounter of 02/13/13 (from the past 24 hour(s))  PROTIME-INR     Status: Abnormal   Collection Time    02/17/13  6:20 AM      Result Value Range   Prothrombin Time 21.6 (*) 11.6 - 15.2 seconds   INR 1.94 (*) 0.00 - 1.49  BASIC METABOLIC PANEL     Status: Abnormal   Collection Time    02/17/13  6:20 AM      Result Value Range   Sodium 138  135 - 145 mEq/L   Potassium 3.6  3.5 - 5.1 mEq/L   Chloride 99  96 - 112 mEq/L   CO2 32  19 - 32 mEq/L   Glucose, Bld 105 (*) 70 - 99 mg/dL   BUN 28 (*) 6 - 23 mg/dL   Creatinine, Ser 4.09 (*) 0.50 - 1.10 mg/dL   Calcium 8.5  8.4 - 81.1 mg/dL   GFR calc non Af Amer 42 (*) >90 mL/min   GFR calc Af Amer 48 (*) >90 mL/min    Intake/Output Summary (Last 24 hours) at 02/17/13 0901 Last data filed at 02/17/13 9147  Gross per 24 hour  Intake    600 ml  Output    850 ml  Net   -250 ml     ASSESSMENT AND PLAN:  Hyperkalemia:  As below  Acute on chronic systolic and diastolic HF:    I think that she does have some volume excess and her weight is up a little from her baseline.  Today I will increase her torsemide.  I will also restart daily KDur.  However, she is very very tenuous.  She will need close follow up and transition of care.  She  will need   Atrial fibrillation:  Warfarin per pharmacy.  Still with regular rhythm sinus  CKD:  Creat continues to improve.    UTI:  Treated.  Day 4/7  of antibiotics.    Palliative care:  I appreciate the Palliative care consult and she and I will continue to address her DNR status.   I spoke with our Child psychotherapist and we are looking for skilled nursing.  If she goes today she will need a BMET on Monday and follow up in our office or HF within 7 days.      Fayrene Fearing [facility] 02/17/2013 9:01 AM

## 2013-02-17 NOTE — Progress Notes (Signed)
Physical Therapy Treatment Patient Details Name: Miranda [name] MRN: [id number] DOB: Aug 04, 1941 Today's Date: 02/17/2013 Time: 4098-1191 PT Time Calculation (min): 27 min  PT Assessment / Plan / Recommendation  PT Comments   Pt is making progress toward PT goals demonstrated by increased ambulation during today's Tx. She continues to be limited by fatigue and weakness. Pt is agreeable to SNF at d/c in order to increase her functional independence.  Follow Up Recommendations  SNF     Does the patient have the potential to tolerate intense rehabilitation     Barriers to Discharge        Equipment Recommendations  None recommended by PT    Recommendations for Other Services    Frequency Min 2X/week   Progress towards PT Goals Progress towards PT goals: Progressing toward goals  Plan Current plan remains appropriate    Precautions / Restrictions Precautions Precautions: Fall Precaution Comments: Bilateral una boots, neuropathy in her hands and feet.  Significant history of frequent falls (two this year so far).   Restrictions Weight Bearing Restrictions: No   Pertinent Vitals/Pain BP prior to Tx 93/51 and 91/53 after Tx. Nursing indicated that it was ok to work with pt. Nursing stated that pt's BP is low at baseline.    Mobility  Bed Mobility Bed Mobility: Not assessed Transfers Transfers: Sit to Stand;Stand to Sit Sit to Stand: 5: Supervision;With upper extremity assist;With armrests;From chair/3-in-1 Stand to Sit: 5: Supervision;With upper extremity assist;With armrests;To chair/3-in-1 Details for Transfer Assistance: Pt. required on VC on proper technique for transferring sit <-> stand. Pt requires supervision due to neuropathy and LE weakness. Ambulation/Gait Ambulation/Gait Assistance: 4: Min assist Ambulation Distance (Feet): 100 Feet Assistive device: Rolling walker Ambulation/Gait Assistance Details: Pt needed cueing to stay inside of RW. Patient needed min assist  cueing for safey when turning with RW. Pt stated that she uses a rollator at home and is used to having pivoting wheels. Pt. ambulated with head and neck in flexion. She was aware of her posture and was able to self correct when she was aware.  Gait Pattern: Step-through pattern;Right steppage;Left steppage;Trunk flexed Gait velocity: decreased Stairs: No    Exercises General Exercises - Lower Extremity Long Arc Quad: AROM;Both;10 reps;Seated Hip Flexion/Marching: AROM;Both;10 reps;Seated Heel Raises: AROM;Both;10 reps;Seated        PT Goals (current goals can now be found in the care plan section)    Visit Information  Last PT Received On: 02/17/13 Assistance Needed: +1 History of Present Illness: 72 y.o. female admitted to [facility] for hyperkalemia, acute on chronic diastolic and systolic heart failure, UTI, and weakness.      Subjective Data  Subjective: I am ready to get out of the hospital   Cognition  Cognition Arousal/Alertness: Awake/alert Behavior During Therapy: [facility] for tasks assessed/performed Overall Cognitive Status: Within Functional Limits for tasks assessed    Balance     End of Session PT - End of Session Equipment Utilized During Treatment: Gait belt Activity Tolerance: Patient limited by fatigue Patient left: in chair;with call bell/phone within reach Nurse Communication: Mobility status;Other (comment) (low BP 93/51 before Tx, nursing Ok'd PT.)   GP     Jolyn Nap, SPTA 02/17/2013, 10:05 AM

## 2013-02-17 NOTE — Progress Notes (Signed)
ANTICOAGULATION CONSULT NOTE - Follow Up Consult  Pharmacy Consult:  Coumadin Indication:  history of Afib  Allergies  Allergen Reactions  . Statins Other (See Comments)    Leg cramps and restlessness   Patient Measurements: Height: 5\' 3"  (160 cm) Weight: 132 lb 1.6 oz (59.92 kg) (scale c) IBW/kg (Calculated) : 52.4  Vital Signs: Temp: 98 F (36.7 C) (06/27 0543) Temp src: Oral (06/27 0543) BP: 91/48 mmHg (06/27 0543) Pulse Rate: 87 (06/27 0543)  Labs:  Recent Labs  02/15/13 0650 02/16/13 0410 02/17/13 0620  LABPROT 32.1* 21.9* 21.6*  INR 3.35* 1.98* 1.94*  CREATININE 1.68* 1.30* 1.25*    Estimated Creatinine Clearance: 33.2 ml/min (by C-G formula based on Cr of 1.25).  Assessment: 72 YOF on Coumadin PTA for history of Afib.  Coumadin held since admission due to elevated INR upon admit.  INR decreased significantly from 6 to 3.35 without a reversal agent.  INR now dropping fast, will be in "catch up" mode most likely for next couple of days.  His INR today is 1.94.  No bleeding reported.  Will continue with home dose of 2.5mg  today but watch for drug/drug interaction with Cipro.    Home Coumadin dose: 2.5mg  PO daily except 1.25mg  on Mon  Goal of Therapy:  INR 2-3 Monitor platelets by anticoagulation protocol: Yes   Plan:  - Coumadin 2.5 mg PO again today - Daily PT / INR - Consider duration of therapy with Cipro (interacting medication)- Day#4 and urine culture without predominant organism - Consider restart of Midodrine as patient is hypotensive  Link Snuffer, PharmD, BCPS Clinical Pharmacist [phone] Thank you for allowing pharmacy to be part of this patients care team. 02/17/2013, 8:40 AM

## 2013-02-17 NOTE — Progress Notes (Signed)
Ronit Cranfield, PTA [phone] 02/17/2013  

## 2013-02-18 DIAGNOSIS — I472 Ventricular tachycardia: Secondary | ICD-10-CM

## 2013-02-18 DIAGNOSIS — E854 Organ-limited amyloidosis: Secondary | ICD-10-CM

## 2013-02-18 LAB — BASIC METABOLIC PANEL
CO2: 33 mEq/L — ABNORMAL HIGH (ref 19–32)
Calcium: 8.4 mg/dL (ref 8.4–10.5)
Creatinine, Ser: 1.21 mg/dL — ABNORMAL HIGH (ref 0.50–1.10)
Glucose, Bld: 97 mg/dL (ref 70–99)

## 2013-02-18 MED ORDER — MIDODRINE HCL 2.5 MG PO TABS
2.5000 mg | ORAL_TABLET | Freq: Three times a day (TID) | ORAL | Status: DC
  Filled 2013-02-18 (×9): qty 1

## 2013-02-18 MED ORDER — WARFARIN SODIUM 4 MG PO TABS
4.0000 mg | ORAL_TABLET | Freq: Once | ORAL | Status: AC
  Administered 2013-02-18: 4 mg via ORAL
  Filled 2013-02-18: qty 1

## 2013-02-18 NOTE — Progress Notes (Signed)
ANTICOAGULATION CONSULT NOTE - Follow Up Consult  Pharmacy Consult:  Coumadin Indication:  history of Afib  Allergies  Allergen Reactions  . Statins Other (See Comments)    Leg cramps and restlessness   Patient Measurements: Height: 5\' 3"  (160 cm) Weight: 128 lb 4.9 oz (58.2 kg) IBW/kg (Calculated) : 52.4  Vital Signs: Temp: 97.8 F (36.6 C) (06/28 0500) Temp src: Oral (06/28 0500) BP: 95/58 mmHg (06/28 0500) Pulse Rate: 87 (06/28 0500)  Labs:  Recent Labs  02/16/13 0410 02/17/13 0620 02/18/13 0515  LABPROT 21.9* 21.6* 19.4*  INR 1.98* 1.94* 1.69*  CREATININE 1.30* 1.25* 1.21*    Estimated Creatinine Clearance: 34.3 ml/min (by C-G formula based on Cr of 1.21).  Assessment: 72 YOF on Coumadin PTA for history of Afib.  Coumadin held since admission due to elevated INR upon admit.  INR decreased significantly from 6 to 3.35 without a reversal agent.  INR now dropping fast, will be in "catch up" mode most likely for next couple of days.  His INR today is 1.69.  No bleeding reported.  Will continue to watch for drug/drug interaction with Cipro.    Home Coumadin dose: 2.5mg  PO daily except 1.25mg  on Mon  Goal of Therapy:  INR 2-3 Monitor platelets by anticoagulation protocol: Yes   Plan:  - Coumadin 4 mg PO again today - Daily PT / INR - Consider adding a stop date to Cipro (interacting medication)- Day#5 and urine culture without predominant organism - Consider restart of Midodrine as patient is hypotensive  Link Snuffer, PharmD, BCPS Clinical Pharmacist [phone] Thank you for allowing pharmacy to be part of this patients care team. 02/18/2013, 6:48 AM

## 2013-02-18 NOTE — Progress Notes (Signed)
Patient Miranda [name]      DOB: Jun 24, 1941      WUJ:[id number]   Emotional support offered. Patient states she feels better rested and had a daughter day with rehabilitation. She is looking forward to going to skilled nursing facility to further strengthen herself before going home.  Tamsin Nader L. Ladona Ridgel, MD MBA The Palliative Medicine Team at [facility] Phone: [phone] Pager: [phone]

## 2013-02-18 NOTE — Progress Notes (Signed)
CSW met with pt to discuss NHP.  Pt is agreeable to placement in the [place] area.  FL2 faxed and [place] filed.  We await bed offers.

## 2013-02-18 NOTE — Progress Notes (Signed)
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 Volume overload anjd UTI and found also to have recurrent wide complex tachycardia -170 bpm    Rx w diuresis and PT and ABX; palliative care consultation ongoing  Feels weak and anticipated going to rehab  Past Medical History  Diagnosis Date  . Lactose intolerance   . IBS (irritable bowel syndrome)   . Dyslipidemia   . 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 Kidney stone     R ureteral stone  . Coronary artery disease   . S/P hemorrhoidectomy   . Arthritis   . Carotid stenosis, bilateral PER DUPLEX  04-10-2011    RICA  40-59%/  LICA 60-79%  . Cardiomyopathy secondary     EF 25% echo 2013  . Atrial fibrillation     chronic anticoag  . Hypercholesterolemia   . Spinal stenosis   . Neuromuscular disorder     neuropathy d/t amyloidosis  . ANXIETY   . DEPRESSION   . Branch retinal vein occlusion of left eye     12/2011 event - vision improving - Following with optho and retinal specialist for same    Scheduled Meds:  Scheduled Meds: . calcitRIOL  0.25 mcg Oral Daily  . ciprofloxacin  500 mg Oral Q24H  . diflunisal  250 mg Oral BID  . fluticasone  2 spray Each Nare Daily  . gabapentin  100 mg Oral q morning - 10a  . gabapentin  200 mg Oral Q24H  . multivitamin with minerals  1 tablet Oral Daily  . pantoprazole  40 mg Oral BID  . potassium chloride  20 mEq Oral BID  . prednisoLONE acetate  1 drop Right Eye BID  . saccharomyces boulardii  250 mg Oral BID  . sodium chloride  3 mL Intravenous Q12H  . torsemide  60 mg Oral BID  . warfarin  4 mg Oral ONCE-1800  . Warfarin - Pharmacist Dosing Inpatient   Does not apply q1800   Continuous Infusions:   PHYSICAL EXAM Filed Vitals:   02/17/13 0543 02/17/13 1403 02/17/13 2101 02/18/13 0500  BP: 91/48  94/59 92/59 95/58   Pulse: 87 86 91 87  Temp: 98 F (36.7 C) 98 F (36.7 C) 98.8 F (37.1 C) 97.8 F (36.6 C)  TempSrc: Oral Oral Oral Oral  Resp: 18 18 17 18   Height:      Weight: 132 lb 1.6 oz (59.92 kg)   128 lb 4.9 oz (58.2 kg)  SpO2: 96% 96% 96% 96%   Well developed and nourished in no acute distress HENT normal Neck supple with JVP-flat Clear Regular rate and rhythm, no murmurs or gallops Abd-soft with active BS No Clubbing cyanosis wrapped  Skin-warm and dry A & Oriented  Grossly normal sensory and motor function  TELEMETRY: Reviewed telemetry pt in NSR and paroxysms of afib:    Intake/Output Summary (Last 24 hours) at 02/18/13 0931 Last data filed at 02/18/13 0724  Gross per 24 hour  Intake    740 ml  Output   1975 ml  Net  -1235 ml    LABS: Basic Metabolic Panel:  Recent Labs Lab 02/13/13 1135 02/13/13 1517 02/13/13 1952 02/14/13 0545 02/15/13 0650 02/16/13 0410 02/17/13 0620 02/18/13 0515  NA 131* 132* 136 136 138 138 138 140  K >7.5* 6.6* 5.3*  4.1 2.9* 3.0* 3.6 3.8  CL 91* 91* 94* 94* 95* 96 99 100  CO2 25 30 29 27 31  33* 32 33*  GLUCOSE 99 72 155* 77 107* 102* 105* 97  BUN 57* 58* 58* 56* 45* 36* 28* 30*  CREATININE 1.99* 2.16* 2.03* 2.00* 1.68* 1.30* 1.25* 1.21*  CALCIUM 9.4 9.4 9.4 8.8 8.2* 8.2* 8.5 8.4  MG 2.9*  --   --   --   --   --   --   --    Cardiac Enzymes: No results found for this basename: CKTOTAL, CKMB, CKMBINDEX, TROPONINI,  in the last 72 hours CBC:  Recent Labs Lab 02/13/13 1135 02/14/13 0545  WBC 9.6 8.3  NEUTROABS 7.6  --   HGB 15.2* 13.2  HCT 45.0 39.5  MCV 94.9 95.9  PLT 232 191   PROTIME:  Recent Labs  02/16/13 0410 02/17/13 0620 02/18/13 0515  LABPROT 21.9* 21.6* 19.4*  INR 1.98* 1.94* 1.69*   Liver Function Tests: No results found for this basename: AST, ALT, ALKPHOS, BILITOT, PROT, ALBUMIN,  in the last 72 hours No results found for this basename: LIPASE, AMYLASE,  in the last 72 hours BNP: BNP  (last 3 results)  Recent Labs  02/13/13 1135 02/13/13 2300  PROBNP 45409.8* 31427.0*   Tel and ECG reviewed  Abrupt transitions with changes in vectors make =s WCT 6/23 concerning for VT  ASSESSMENT AND PLAN:  Principal Problem:   Chronic combined systolic and diastolic heart failure Active Problems:   Hyperkalemia   Cardiac amyloidosis   Wide-complex tachycardia-probable VT  Rehab consult on mon Rhythm issues are concerning--profound conduction system disease and prob VT  I can not find anything specific for/aginst amio in [place], input from [place] team may be useful Paper from [employer] just published raises the question of ICD in this cohort, with the observation that 4/5 pts survived VT VF with ICD and there criteria for implant was syncope or VT Will discuss w [facility] on Mon continje ABX for now Resume proamatine   Signed, Sherryl Manges MD  02/18/2013

## 2013-02-19 LAB — BASIC METABOLIC PANEL
BUN: 29 mg/dL — ABNORMAL HIGH (ref 6–23)
CO2: 34 mEq/L — ABNORMAL HIGH (ref 19–32)
Calcium: 8.4 mg/dL (ref 8.4–10.5)
Creatinine, Ser: 1.29 mg/dL — ABNORMAL HIGH (ref 0.50–1.10)
GFR calc non Af Amer: 40 mL/min — ABNORMAL LOW (ref >90)
Glucose, Bld: 108 mg/dL — ABNORMAL HIGH (ref 70–99)

## 2013-02-19 MED ORDER — GABAPENTIN 100 MG PO CAPS
100.0000 mg | ORAL_CAPSULE | Freq: Once | ORAL | Status: AC
  Administered 2013-02-19: 100 mg via ORAL
  Filled 2013-02-19: qty 1

## 2013-02-19 MED ORDER — WARFARIN SODIUM 3 MG PO TABS
3.0000 mg | ORAL_TABLET | Freq: Once | ORAL | Status: AC
  Administered 2013-02-19: 3 mg via ORAL
  Filled 2013-02-19: qty 1

## 2013-02-19 MED ORDER — BISACODYL 10 MG RE SUPP
10.0000 mg | Freq: Once | RECTAL | Status: AC
  Administered 2013-02-19: 10 mg via RECTAL
  Filled 2013-02-19: qty 1

## 2013-02-19 NOTE — Progress Notes (Signed)
Chaplain responded to page from [phone] nurse that pt would like to see a chaplain. Pt's name had been mentioned at Palliative Care rounds on Friday, but this was first time I had met pt. Pt was smiling, cheerful, and appreciated my coming. Pt wanted to share a moment of grace she had experienced here at the hospital for which she was very grateful to God. As a result pt states she believes "God still has something for me to do." Pt is hoping to go to SNF tomorrow to begin rehab and be able to walk again. (RN had told me it would be 1 to 2 days before she went.) She clearly wants to be able to go home after SNF. Pt states awareness that she has a weak heart and that her health has been declining. I touched gently on palliative care as something to consider and made sure she knew pc was not the same as hospice. She understood the distinction but also made a point to say, "I'm not dying." Pt volunteered that she would want to talk with her family again before giving an answer about pc. Faith is important to pt. During rehab she wants to get back to Bible study. Pt requests a Bible. I will bring her one. We had prayer together, and pt expressed appreciation for chaplain visit.

## 2013-02-19 NOTE — Progress Notes (Signed)
ANTICOAGULATION CONSULT NOTE - Follow Up Consult  Pharmacy Consult:  Coumadin Indication:  history of Afib  Allergies  Allergen Reactions  . Statins Other (See Comments)    Leg cramps and restlessness   Patient Measurements: Height: 5\' 3"  (160 cm) Weight: 132 lb 1.6 oz (59.92 kg) (scale c) IBW/kg (Calculated) : 52.4  Vital Signs: Temp: 98.1 F (36.7 C) (06/29 0648) Temp src: Oral (06/29 0648) BP: 94/62 mmHg (06/29 0648) Pulse Rate: 63 (06/29 0648)  Labs:  Recent Labs  02/17/13 0620 02/18/13 0515 02/19/13 0555  LABPROT 21.6* 19.4* 21.4*  INR 1.94* 1.69* 1.92*  CREATININE 1.25* 1.21* 1.29*    Estimated Creatinine Clearance: 32.1 ml/min (by C-G formula based on Cr of 1.29).  Assessment: 72 YOF on Coumadin PTA for history of Afib.  Coumadin held since admission due to elevated INR upon admit.  INR decreased significantly from 6 to 3.35 without a reversal agent.  INR now dropping fast, will be in "catch up" mode most likely for next couple of days.  His INR today is 1.92.  No bleeding reported.  Will continue to watch for drug/drug interaction with Cipro.  Also on NSAID which can increase risk of bleeding. No new CBC available.   Home Coumadin dose: 2.5mg  PO daily except 1.25mg  on Mon  Goal of Therapy:  INR 2-3   Plan:  - Coumadin 3 mg PO again today - Daily PT / INR - Consider adding a stop date to Cipro (interacting medication)- Day#6 and urine culture without predominant organism - Monitor for signs and symptoms of bleeding.   Link Snuffer, PharmD, BCPS Clinical Pharmacist [phone] Thank you for allowing pharmacy to be part of this patients care team. 02/19/2013, 8:29 AM

## 2013-02-19 NOTE — Progress Notes (Signed)
@   approx.0200 pt having 6-9 beats of V-TACHS. V/s Stable  While EKG showing some abnormalities. Dr Karie Schwalbe. Turner & RRT notified. Dr Mayford Knife ordered Bmet for AM while RRT was on the unit and viewed the EKG.

## 2013-02-19 NOTE — Progress Notes (Addendum)
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 Volume overload anjd UTI and found also to have recurrent wide complex tachycardia ->170 bpm    Rx w diuresis and PT and ABX; palliative care consultation ongoing  Feels weak and anticipated going to rehab  Past Medical History  Diagnosis Date  . Lactose intolerance   . IBS (irritable bowel syndrome)   . Dyslipidemia   . 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 Kidney stone     R ureteral stone  . Coronary artery disease   . S/P hemorrhoidectomy   . Arthritis   . Carotid stenosis, bilateral PER DUPLEX  04-10-2011    RICA  40-59%/  LICA 60-79%  . Cardiomyopathy secondary     EF 25% echo 2013  . Atrial fibrillation     chronic anticoag  . Hypercholesterolemia   . Spinal stenosis   . Neuromuscular disorder     neuropathy d/t amyloidosis  . ANXIETY   . DEPRESSION   . Branch retinal vein occlusion of left eye     12/2011 event - vision improving - Following with optho and retinal specialist for same    Scheduled Meds:  Scheduled Meds: . calcitRIOL  0.25 mcg Oral Daily  . ciprofloxacin  500 mg Oral Q24H  . diflunisal  250 mg Oral BID  . fluticasone  2 spray Each Nare Daily  . gabapentin  100 mg Oral q morning - 10a  . gabapentin  200 mg Oral Q24H  . midodrine  2.5 mg Oral TID WC  . multivitamin with minerals  1 tablet Oral Daily  . pantoprazole  40 mg Oral BID  . potassium chloride  20 mEq Oral BID  . prednisoLONE acetate  1 drop Right Eye BID  . saccharomyces boulardii  250 mg Oral BID  . sodium chloride  3 mL Intravenous Q12H  . torsemide  60 mg Oral BID  . warfarin  3 mg Oral ONCE-1800  . Warfarin - Pharmacist Dosing Inpatient   Does not apply q1800   Continuous Infusions:   PHYSICAL EXAM Filed Vitals:   02/18/13 1942 02/19/13 0136  02/19/13 0138 02/19/13 0648  BP: 87/55 77/56 93/51  94/62  Pulse:  61  63  Temp: 98.4 F (36.9 C) 98.2 F (36.8 C)  98.1 F (36.7 C)  TempSrc: Oral Oral  Oral  Resp: 18 18  18   Height:      Weight:    132 lb 1.6 oz (59.92 kg)  SpO2: 98% 97%  97%   Wt down 9 Lbs over all and -5L    Well developed and nourished in no acute distress HENT normal Neck supple with JVP-flat Clear Regular rate and rhythm, no murmurs or gallops Abd-soft with active BS No Clubbing cyanosis wrapped  Skin-warm and dry A & Oriented  Grossly normal sensory and motor function  TELEMETRY: Reviewed telemetry pt in NSR and paroxysms of afib: VT-NS also   Intake/Output Summary (Last 24 hours) at 02/19/13 1041 Last data filed at 02/19/13 0929  Gross per 24 hour  Intake    540 ml  Output   1875 ml  Net  -1335 ml    LABS: Basic Metabolic Panel:  Recent Labs Lab 02/13/13 1135  02/13/13 1952 02/14/13 0545 02/15/13 1610 02/16/13 0410 02/17/13 9604 02/18/13 0515 02/19/13 5409  NA 131*  < > 136 136 138 138 138 140 139  K >7.5*  < > 5.3* 4.1 2.9* 3.0* 3.6 3.8 3.6  CL 91*  < > 94* 94* 95* 96 99 100 98  CO2 25  < > 29 27 31  33* 32 33* 34*  GLUCOSE 99  < > 155* 77 107* 102* 105* 97 108*  BUN 57*  < > 58* 56* 45* 36* 28* 30* 29*  CREATININE 1.99*  < > 2.03* 2.00* 1.68* 1.30* 1.25* 1.21* 1.29*  CALCIUM 9.4  < > 9.4 8.8 8.2* 8.2* 8.5 8.4 8.4  MG 2.9*  --   --   --   --   --   --   --   --   < > = values in this interval not displayed. Cardiac Enzymes: No results found for this basename: CKTOTAL, CKMB, CKMBINDEX, TROPONINI,  in the last 72 hours CBC:  Recent Labs Lab 02/13/13 1135 02/14/13 0545  WBC 9.6 8.3  NEUTROABS 7.6  --   HGB 15.2* 13.2  HCT 45.0 39.5  MCV 94.9 95.9  PLT 232 191   PROTIME:  Recent Labs  02/17/13 0620 02/18/13 0515 02/19/13 0555  LABPROT 21.6* 19.4* 21.4*  INR 1.94* 1.69* 1.92*   Liver Function Tests: No results found for this basename: AST, ALT, ALKPHOS,  BILITOT, PROT, ALBUMIN,  in the last 72 hours No results found for this basename: LIPASE, AMYLASE,  in the last 72 hours BNP: BNP (last 3 results)  Recent Labs  02/13/13 1135 02/13/13 2300  PROBNP 40981.1* 31427.0*   Tel and ECG reviewed  Abrupt transitions with changes in vectors make =s WCT 6/23 concerning for VT  ASSESSMENT AND PLAN:  Principal Problem:   Chronic combined systolic and diastolic heart failure Active Problems:   Hyperkalemia   Cardiac amyloidosis   Ventricular tachycardia, sustained and nonsustained    Rhythm issues are concerning--profound conduction system disease and  VT  I can not find anything specific for/aginst amio in FA, input from [place] team may be useful;  Almost certianly though would require pacing >>CRT? Paper from [employer] just published raises the question of ICD in this cohort, with the observation that 4/5 pts survived VT VF with ICD and there criteria for implant was syncope or VT sustained and nonsustained,  None of the pts in this series had TTRH - alanine 60 mutation although 2 did have TTR mutations of a different k ind For >  we discussed the implications of heart block and the potential need for pacing, the role of CRT and the [employer] protocol for ICD implanatation although as noted it did not contain any TTR-alanine 60 mutations Will discuss w [facility] on Mon continje ABX for now day 6/7 Resume proamatine   Signed, Sherryl Manges MD  02/19/2013

## 2013-02-20 LAB — BASIC METABOLIC PANEL
BUN: 31 mg/dL — ABNORMAL HIGH (ref 6–23)
CO2: 30 mEq/L (ref 19–32)
Chloride: 98 mEq/L (ref 96–112)
GFR calc non Af Amer: 40 mL/min — ABNORMAL LOW (ref >90)
Glucose, Bld: 95 mg/dL (ref 70–99)
Potassium: 4.4 mEq/L (ref 3.5–5.1)
Sodium: 138 mEq/L (ref 135–145)

## 2013-02-20 LAB — HEPATIC FUNCTION PANEL
ALT: 30 U/L (ref 0–35)
AST: 31 U/L (ref 0–37)
Albumin: 2.7 g/dL — ABNORMAL LOW (ref 3.5–5.2)
Indirect Bilirubin: 0.5 mg/dL (ref 0.3–0.9)
Total Protein: 6.1 g/dL (ref 6.0–8.3)

## 2013-02-20 MED ORDER — ESOMEPRAZOLE MAGNESIUM 20 MG PO CPDR
20.0000 mg | DELAYED_RELEASE_CAPSULE | Freq: Two times a day (BID) | ORAL | Status: DC
  Administered 2013-02-20 – 2013-02-21 (×3): 20 mg via ORAL
  Filled 2013-02-20 (×5): qty 1

## 2013-02-20 MED ORDER — WARFARIN SODIUM 1 MG PO TABS
1.5000 mg | ORAL_TABLET | Freq: Once | ORAL | Status: AC
  Administered 2013-02-20: 1.5 mg via ORAL
  Filled 2013-02-20: qty 1

## 2013-02-20 MED ORDER — TORSEMIDE 20 MG PO TABS
40.0000 mg | ORAL_TABLET | Freq: Two times a day (BID) | ORAL | Status: DC
  Administered 2013-02-20 – 2013-02-21 (×2): 40 mg via ORAL
  Filled 2013-02-20 (×4): qty 2

## 2013-02-20 MED ORDER — MIDODRINE HCL 5 MG PO TABS
5.0000 mg | ORAL_TABLET | Freq: Three times a day (TID) | ORAL | Status: DC
  Administered 2013-02-20 – 2013-02-21 (×4): 5 mg via ORAL
  Filled 2013-02-20 (×6): qty 1

## 2013-02-20 MED ORDER — MAGNESIUM HYDROXIDE 400 MG/5ML PO SUSP
15.0000 mL | Freq: Every day | ORAL | Status: DC
  Administered 2013-02-20 – 2013-02-21 (×2): 15 mL via ORAL
  Filled 2013-02-20 (×2): qty 30

## 2013-02-20 MED ORDER — CYCLOBENZAPRINE HCL 10 MG PO TABS: 5.0000 mg | ORAL_TABLET | Freq: Three times a day (TID) | ORAL | Status: DC | PRN

## 2013-02-20 MED ORDER — NON FORMULARY: 20.0000 mg | Freq: Two times a day (BID) | Status: DC

## 2013-02-20 NOTE — Progress Notes (Signed)
ANTICOAGULATION CONSULT NOTE - Follow Up Consult  Pharmacy Consult:  Coumadin Indication:  history of Afib  Allergies  Allergen Reactions  . Statins Other (See Comments)    Leg cramps and restlessness   Patient Measurements: Height: 5\' 3"  (160 cm) Weight: 132 lb 12.8 oz (60.238 kg) IBW/kg (Calculated) : 52.4  Vital Signs: Temp: 97.1 F (36.2 C) (06/30 0527) Temp src: Oral (06/30 0527) BP: 76/52 mmHg (06/30 0528) Pulse Rate: 89 (06/30 0528)  Labs:  Recent Labs  02/18/13 0515 02/19/13 0555 02/20/13 0710  LABPROT 19.4* 21.4* 22.5*  INR 1.69* 1.92* 2.05*  CREATININE 1.21* 1.29* 1.28*    Estimated Creatinine Clearance: 32.4 ml/min (by C-G formula based on Cr of 1.28).  Assessment: 72 YOF on Coumadin PTA for history of Afib.  Coumadin held at admission due to elevated INRs.  INR now with trend up and patient noted on cipro. Patient will likely need a lower dose as INR is beginning to trend up  Home Coumadin dose: 2.5mg  PO daily except 1.25mg  on Mon  Goal of Therapy:  INR 2-3   Plan:  - Coumadin 1.5 mg PO today - Daily PT / INR  Harland German, Pharm D 02/20/2013 8:22 AM

## 2013-02-20 NOTE — Progress Notes (Signed)
Called by nurse for pt's concern of yellowish stools, yellowed eyes, and yellowed teeth. Pt reported h/o liver trouble in past related to amyloid. She was admitted with weakness, hyperkalemia, WCT, in setting of amyloid cardiomyopathy. Hepatic function panel abnormal on 6/23 but tbili was OK. Pt otherwise feels at baseline. She does have very mild scleral icterus, some sublingual yellowing. No generalized jaundice. Will repeat hepatic panel tonight as well as in AM. Asked pt to let us know if she begins to feel any different. She states she's actually had a pretty good day. If LFTs remain elevated, consider discontinuing diflunisal which pt has taken for past 4 years off-label for neuropathy for amyloid. Dayna Dunn PA-C

## 2013-02-20 NOTE — Progress Notes (Signed)
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 Volume overload anjd UTI and found also to have recurrent wide complex tachycardia ->170 bpm    Rx w diuresis and PT and ABX; palliative care consultation ongoing  Constipated but   Feels weak and anticipated going to rehab  Past Medical History  Diagnosis Date  . Lactose intolerance   . IBS (irritable bowel syndrome)   . Dyslipidemia   . 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 Kidney stone     R ureteral stone  . Coronary artery disease   . S/P hemorrhoidectomy   . Arthritis   . Carotid stenosis, bilateral PER DUPLEX  04-10-2011    RICA  40-59%/  LICA 60-79%  . Cardiomyopathy secondary     EF 25% echo 2013  . Atrial fibrillation     chronic anticoag  . Hypercholesterolemia   . Spinal stenosis   . Neuromuscular disorder     neuropathy d/t amyloidosis  . ANXIETY   . DEPRESSION   . Branch retinal vein occlusion of left eye     12/2011 event - vision improving - Following with optho and retinal specialist for same    Scheduled Meds:  Scheduled Meds: . calcitRIOL  0.25 mcg Oral Daily  . ciprofloxacin  500 mg Oral Q24H  . diflunisal  250 mg Oral BID  . fluticasone  2 spray Each Nare Daily  . gabapentin  100 mg Oral q morning - 10a  . gabapentin  200 mg Oral Q24H  . midodrine  2.5 mg Oral TID WC  . multivitamin with minerals  1 tablet Oral Daily  . pantoprazole  40 mg Oral BID  . potassium chloride  20 mEq Oral BID  . prednisoLONE acetate  1 drop Right Eye BID  . saccharomyces boulardii  250 mg Oral BID  . sodium chloride  3 mL Intravenous Q12H  . torsemide  60 mg Oral BID  . warfarin  1.5 mg Oral ONCE-1800  . Warfarin - Pharmacist Dosing Inpatient   Does not apply q1800   Continuous Infusions:   PHYSICAL EXAM Filed Vitals:   02/19/13  1509 02/19/13 1955 02/20/13 0527 02/20/13 0528  BP: 101/64 104/58 75/53 76/52   Pulse: 92 118 90 89  Temp: 98.6 F (37 C) 98.2 F (36.8 C) 97.1 F (36.2 C)   TempSrc: Oral Oral Oral   Resp: 19 18 18    Height:      Weight:    132 lb 12.8 oz (60.238 kg)  SpO2: 95% 95% 95%    Wt down 9 Lbs over all and -5L    Well developed and nourished in no acute distress HENT normal Neck supple with JVP-flat Clear Regular rate and rhythm, no murmurs or gallops Abd-soft with active BS No Clubbing cyanosis wrapped  Skin-warm and dry A & Oriented  Grossly normal sensory and motor function  TELEMETRY: Reviewed telemetry pt in NSR and paroxysms of afib:     Intake/Output Summary (Last 24 hours) at 02/20/13 0827 Last data filed at 02/20/13 0402  Gross per 24 hour  Intake    540 ml  Output    800 ml  Net   -260 ml    LABS: Basic Metabolic Panel:  Recent Labs Lab 02/13/13 1135  02/14/13 0545 02/15/13 0650 02/16/13 0410 02/17/13 0620 02/18/13  0515 02/19/13 0555 02/20/13 0710  NA 131*  < > 136 138 138 138 140 139 138  K >7.5*  < > 4.1 2.9* 3.0* 3.6 3.8 3.6 4.4  CL 91*  < > 94* 95* 96 99 100 98 98  CO2 25  < > 27 31 33* 32 33* 34* 30  GLUCOSE 99  < > 77 107* 102* 105* 97 108* 95  BUN 57*  < > 56* 45* 36* 28* 30* 29* 31*  CREATININE 1.99*  < > 2.00* 1.68* 1.30* 1.25* 1.21* 1.29* 1.28*  CALCIUM 9.4  < > 8.8 8.2* 8.2* 8.5 8.4 8.4 8.4  MG 2.9*  --   --   --   --   --   --   --   --   < > = values in this interval not displayed. Cardiac Enzymes: No results found for this basename: CKTOTAL, CKMB, CKMBINDEX, TROPONINI,  in the last 72 hours CBC:  Recent Labs Lab 02/13/13 1135 02/14/13 0545  WBC 9.6 8.3  NEUTROABS 7.6  --   HGB 15.2* 13.2  HCT 45.0 39.5  MCV 94.9 95.9  PLT 232 191   PROTIME:  Recent Labs  02/18/13 0515 02/19/13 0555 02/20/13 0710  LABPROT 19.4* 21.4* 22.5*  INR 1.69* 1.92* 2.05*   Liver Function Tests: No results found for this basename: AST, ALT,  ALKPHOS, BILITOT, PROT, ALBUMIN,  in the last 72 hours No results found for this basename: LIPASE, AMYLASE,  in the last 72 hours BNP: BNP (last 3 results)  Recent Labs  02/13/13 1135 02/13/13 2300  PROBNP 21308.6* 31427.0*   Tel and ECG reviewed  Abrupt transitions with changes in vectors make =s WCT 6/23 concerning for VT  ASSESSMENT AND PLAN:  Principal Problem:   Chronic combined systolic and diastolic heart failure Active Problems:   Hyperkalemia   Cardiac amyloidosis   Ventricular tachycardia, sustained and nonsustained    Rhythm issues are concerning--profound conduction system disease and  VT  I can not find anything specific for/aginst amio in FA, input from [place] team may be useful;  Almost certianly though would require pacing    Discussed with [facility] yesterdayl he is impressed at the progressive decline in functional status and wonders that her prognosis is much less than one year, hence he is not inclined to pursue ICD therapy  ABX stop today  Low blood pressure>>  decrease demadex  Not willing to take pramantine without Dr [facility] input Will use prune juice and mom  Wants repeat ua in few days to make sure uti is cleared   Signed, Sherryl Manges MD  02/20/2013

## 2013-02-20 NOTE — Progress Notes (Signed)
CSW met with patient today and bed offers provided. She wanted placement at [facility] but they refused patient. She has now chosen [facility] [address]- spoke to [facility] and they will have a bed available in the a.m for patient. She is anxious to move to next level of care.  Plan d/c to SNF tomorrow if medically stable per MD.  Lupita Leash T. [place] Pugh  [phone]

## 2013-02-20 NOTE — Progress Notes (Signed)
Miranda [name], PTA [phone] 02/20/2013  

## 2013-02-20 NOTE — Progress Notes (Signed)
Physical Therapy Treatment Patient Details Name: Miranda [name] MRN: [id number] DOB: [birth year] Today's Date: 02/20/2013 Time: 7829-5621 PT Time Calculation (min): 24 min  PT Assessment / Plan / Recommendation  PT Comments   Pt is continuing to progress with PT goals requiring much fewer cues to perform bed mobility, transfers, and gait in a safe manner. Pt continues to be limited by fatigue and weakness. She is eager to d/c to SNF to increase her strength and functional independence.  Follow Up Recommendations  SNF     Does the patient have the potential to tolerate intense rehabilitation     Barriers to Discharge        Equipment Recommendations  None recommended by PT    Recommendations for Other Services    Frequency Min 2X/week   Progress towards PT Goals Progress towards PT goals: Progressing toward goals  Plan Current plan remains appropriate    Precautions / Restrictions Precautions Precautions: Fall Precaution Comments: Bilateral una boots, neuropathy in her hands and feet.  Significant history of frequent falls (two this year so far).   Restrictions Weight Bearing Restrictions: No   Pertinent Vitals/Pain Pt BP prior to Tx 81/63. Nursing was consulted and cleared the pt for PT session. After Tx pt BP was 76/50, nursing was notified.    Mobility  Bed Mobility Bed Mobility: Supine to Sit;Sitting - Scoot to Edge of Bed Supine to Sit: 5: Supervision;With rails;HOB elevated Sitting - Scoot to Edge of Bed: 5: Supervision;With rail Details for Bed Mobility Assistance: Pt required no cueing or assistance to safely perform bed mobility.   Transfers Transfers: Sit to Stand;Stand to Sit Sit to Stand: 5: Supervision;With upper extremity assist;From bed Stand to Sit: 5: Supervision;With upper extremity assist;With armrests;To chair/3-in-1 Details for Transfer Assistance: Pt. required no VC for proper technique for transferring sit <-> stand. Pt requires supervision due to  neuropathy and LE weakness. Ambulation/Gait Ambulation/Gait Assistance: 4: Min guard Ambulation Distance (Feet): 100 Feet Assistive device: Rolling walker Ambulation/Gait Assistance Details: patient needed min assist cueing when turning with the RW. Patient given VC x 2 to correct flexed trunk posture. Pt is aware of posture and self-corrects frequently. Gait Pattern: Step-through pattern;Right steppage;Left steppage;Trunk flexed Gait velocity: decreased Stairs: No    Exercises        PT Goals (current goals can now be found in the care plan section)    Visit Information  Last PT Received On: 02/20/13 Assistance Needed: +1 History of Present Illness: 72 y.o. female admitted to [facility] for hyperkalemia, acute on chronic diastolic and systolic heart failure, UTI, and weakness.      Subjective Data  Subjective: pt recieved in bad agreeable to PT   Cognition  Cognition Arousal/Alertness: Awake/alert Behavior During Therapy: WFL for tasks assessed/performed Overall Cognitive Status: Within Functional Limits for tasks assessed    Balance     End of Session PT - End of Session Equipment Utilized During Treatment: Gait belt Activity Tolerance: Patient limited by fatigue Patient left: in chair;with call bell/phone within reach Nurse Communication: Mobility status   GP     Jolyn Nap, SPTA 02/20/2013, 1:45 PM

## 2013-02-20 NOTE — Progress Notes (Signed)
Chris B. Notified about patient rhythm of Atrial Fib, no new orders given at this time.

## 2013-02-20 NOTE — Progress Notes (Signed)
Pt complained of having yellow stool, yellow of the eyes and around the bottom of her teeth, Annabelle Harman notified and new orders placed, will continue to monitor.

## 2013-02-21 ENCOUNTER — Telehealth: Payer: Self-pay | Admitting: Cardiology

## 2013-02-21 DIAGNOSIS — N390 Urinary tract infection, site not specified: Secondary | ICD-10-CM

## 2013-02-21 DIAGNOSIS — I959 Hypotension, unspecified: Secondary | ICD-10-CM

## 2013-02-21 LAB — COMPREHENSIVE METABOLIC PANEL
AST: 30 U/L (ref 0–37)
CO2: 32 mEq/L (ref 19–32)
Chloride: 98 mEq/L (ref 96–112)
Creatinine, Ser: 1.32 mg/dL — ABNORMAL HIGH (ref 0.50–1.10)
GFR calc Af Amer: 45 mL/min — ABNORMAL LOW (ref >90)
GFR calc non Af Amer: 39 mL/min — ABNORMAL LOW (ref >90)
Glucose, Bld: 93 mg/dL (ref 70–99)
Total Bilirubin: 0.6 mg/dL (ref 0.3–1.2)

## 2013-02-21 LAB — CBC
MCH: 31.6 pg (ref 26.0–34.0)
MCHC: 32.7 g/dL (ref 30.0–36.0)
Platelets: 226 10*3/uL (ref 150–400)
RBC: 4.69 MIL/uL (ref 3.87–5.11)

## 2013-02-21 LAB — PROTIME-INR: INR: 2.17 — ABNORMAL HIGH (ref 0.00–1.49)

## 2013-02-21 MED ORDER — TORSEMIDE 20 MG PO TABS: 40.0000 mg | ORAL_TABLET | Freq: Two times a day (BID) | ORAL | Status: DC

## 2013-02-21 MED ORDER — WARFARIN SODIUM 2.5 MG PO TABS
2.5000 mg | ORAL_TABLET | Freq: Once | ORAL | Status: DC
  Filled 2013-02-21: qty 1

## 2013-02-21 MED ORDER — POTASSIUM CHLORIDE CRYS ER 20 MEQ PO TBCR: 20.0000 meq | EXTENDED_RELEASE_TABLET | Freq: Two times a day (BID) | ORAL | Status: DC

## 2013-02-21 MED ORDER — MIDODRINE HCL 10 MG PO TABS: 5.0000 mg | ORAL_TABLET | Freq: Three times a day (TID) | ORAL | Status: DC

## 2013-02-21 NOTE — Telephone Encounter (Signed)
New problem    TCM per [facility] pa appt on  7/8 @ 3:15

## 2013-02-21 NOTE — Consult Note (Signed)
WOC follow-up: Pt declines offer to change Una boots and Aquacel dressings which were applied last Tuesday.  She states she is going to be discharged and plans to have dressings changed at the outpatient wound care center appointment she has on Thursday.  Instructed pt to notify WOC team if discharge is delayed and she needs Una boots changed. Please re-consult if further assistance is needed.  Thank-you,  Cammie Mcgee MSN, RN, CWOCN, [place], CNS [phone]

## 2013-02-21 NOTE — Progress Notes (Signed)
ANTICOAGULATION CONSULT NOTE - Follow Up Consult  Pharmacy Consult:  Coumadin Indication:  history of Afib  Allergies  Allergen Reactions  . Statins Other (See Comments)    Leg cramps and restlessness   Patient Measurements: Height: 5\' 3"  (160 cm) Weight: 133 lb 4.8 oz (60.464 kg) (Scale C) IBW/kg (Calculated) : 52.4  Vital Signs: Temp: 97.3 F (36.3 C) (07/01 0534) Temp src: Oral (07/01 0534) BP: 94/63 mmHg (07/01 0534) Pulse Rate: 89 (07/01 0534)  Labs:  Recent Labs  02/19/13 0555 02/20/13 0710 02/21/13 0637  HGB  --   --  14.8  HCT  --   --  45.2  PLT  --   --  226  LABPROT 21.4* 22.5* 23.5*  INR 1.92* 2.05* 2.17*  CREATININE 1.29* 1.28* 1.32*    Estimated Creatinine Clearance: 31.4 ml/min (by C-G formula based on Cr of 1.32).  Assessment: 72 YOF on Coumadin PTA for history of Afib.  Coumadin held at admission due to elevated INRs.  INR now with trend up and patient now off cipro.  LFTs order this am and are WNL, t. Bili= 0.6.  Home Coumadin dose: 2.5mg  PO daily except 1.25mg  on Mon  Goal of Therapy:  INR 2-3   Plan:  - Coumadin 2.5 mg PO today - Daily PT / INR  Harland German, Pharm D 02/21/2013 8:16 AM

## 2013-02-21 NOTE — Progress Notes (Signed)
SUBJECTIVE:  She feels OK.  No distress.  No pain.    PHYSICAL EXAM Filed Vitals:   02/20/13 0800 02/20/13 1450 02/20/13 1949 02/21/13 0534  BP: 82/57 87/59 106/62 94/63  Pulse: 71 84 88 89  Temp:  98.7 F (37.1 C) 97.4 F (36.3 C) 97.3 F (36.3 C)  TempSrc:   Oral Oral  Resp:   18 19  Height:      Weight:    133 lb 4.8 oz (60.464 kg)  SpO2:  97% 96% 95%   General:  Frail Lungs:  Few basilar crackles improved Heart:  RRR Abdomen:  Positive bowel sounds, no rebound no guarding Extremities:  Edema in the thighs and knees.   Neuro:  Nonfocal  LABS: Lab Results  Component Value Date   TROPONINI <0.30 02/13/2013   Results for orders placed during the hospital encounter of 02/13/13 (from the past 24 hour(s))  HEPATIC FUNCTION PANEL     Status: Abnormal   Collection Time    02/20/13  7:07 PM      Result Value Range   Total Protein 6.1  6.0 - 8.3 g/dL   Albumin 2.7 (*) 3.5 - 5.2 g/dL   AST 31  0 - 37 U/L   ALT 30  0 - 35 U/L   Alkaline Phosphatase 93  39 - 117 U/L   Total Bilirubin 0.7  0.3 - 1.2 mg/dL   Bilirubin, Direct 0.2  0.0 - 0.3 mg/dL   Indirect Bilirubin 0.5  0.3 - 0.9 mg/dL  PROTIME-INR     Status: Abnormal   Collection Time    02/21/13  6:37 AM      Result Value Range   Prothrombin Time 23.5 (*) 11.6 - 15.2 seconds   INR 2.17 (*) 0.00 - 1.49  COMPREHENSIVE METABOLIC PANEL     Status: Abnormal   Collection Time    02/21/13  6:37 AM      Result Value Range   Sodium 138  135 - 145 mEq/L   Potassium 4.7  3.5 - 5.1 mEq/L   Chloride 98  96 - 112 mEq/L   CO2 32  19 - 32 mEq/L   Glucose, Bld 93  70 - 99 mg/dL   BUN 31 (*) 6 - 23 mg/dL   Creatinine, Ser 4.09 (*) 0.50 - 1.10 mg/dL   Calcium 8.7  8.4 - 81.1 mg/dL   Total Protein 5.8 (*) 6.0 - 8.3 g/dL   Albumin 2.6 (*) 3.5 - 5.2 g/dL   AST 30  0 - 37 U/L   ALT 29  0 - 35 U/L   Alkaline Phosphatase 89  39 - 117 U/L   Total Bilirubin 0.6  0.3 - 1.2 mg/dL   GFR calc non Af Amer 39 (*) >90 mL/min   GFR calc  Af Amer 45 (*) >90 mL/min  CBC     Status: None   Collection Time    02/21/13  6:37 AM      Result Value Range   WBC 8.7  4.0 - 10.5 K/uL   RBC 4.69  3.87 - 5.11 MIL/uL   Hemoglobin 14.8  12.0 - 15.0 g/dL   HCT 91.4  78.2 - 95.6 %   MCV 96.4  78.0 - 100.0 fL   MCH 31.6  26.0 - 34.0 pg   MCHC 32.7  30.0 - 36.0 g/dL   RDW 21.3  08.6 - 57.8 %   Platelets 226  150 - 400 K/uL  Intake/Output Summary (Last 24 hours) at 02/21/13 0950 Last data filed at 02/21/13 0920  Gross per 24 hour  Intake   1423 ml  Output   1050 ml  Net    373 ml     ASSESSMENT AND PLAN:   Acute on chronic systolic and diastolic HF:    I have talked to her about an ICD.  I do no think that emotionally she would do well with this.  I do not think that it would extend her life expectancy by any meaningful length of time.  I think it would reduce her quality of life more than improve it.  She and I will continue to have this discussion.  Discharge with meds as listed.    Atrial fibrillation:  Warfarin per pharmacy.    CKD:  Creat much improved since admission and we will need to watch this closely.   UTI:  Completed atb.    Palliative care:  The patient wants to be a full code.    Transition of care appt in less than 7 days with [facility].   Rollene Rotunda 02/21/2013 9:50 AM

## 2013-02-21 NOTE — Progress Notes (Signed)
Occupational Therapy Treatment Patient Details Name: DRESDEN AMENT MRN: [id number] DOB: [birth year] Today's Date: 02/21/2013 Time: 1020-1055 OT Time Calculation (min): 35 min  OT Assessment / Plan / Recommendation  OT comments  Patient reports discharge sometime today to SNF for further rehab to transition home.  Initially, patient declined to perform self care with OT because she wanted the nurse tech to bath her and dress her.  After encouragement, patient agreed that she need to try to do what she could for herself.  Patient donned street clothes in preperation for discharge today.  Patient overall set up for BADL tasks except min assist for LB bath and dress.   Follow Up Recommendations  SNF;Supervision/Assistance - 24 hour    Progress towards OT Goals Progress towards OT goals: Progressing toward goals  Plan Discharge plan remains appropriate    Precautions / Restrictions Precautions Precautions: Fall Precaution Comments: Bilateral una boots, neuropathy in her hands and feet.  Significant history of frequent falls (two this year so far).   Restrictions Weight Bearing Restrictions: No   Pertinent Vitals/Pain Denies pain    ADL  Grooming: Performed;Wash/dry hands;Wash/dry face;Set up Where Assessed - Grooming: Unsupported sitting Upper Body Bathing: Performed;Set up Where Assessed - Upper Body Bathing: Unsupported sitting Lower Body Bathing: Performed;Minimal assistance Where Assessed - Lower Body Bathing: Supported standing;Supported sit to stand;Unsupported sitting Upper Body Dressing: Performed;Set up Where Assessed - Upper Body Dressing: Unsupported sitting Lower Body Dressing: Performed;Minimal assistance Where Assessed - Lower Body Dressing: Supported sit to stand;Supported standing;Unsupported sitting Transfers/Ambulation Related to ADLs: Required assist for sit>stand from recliner and close Supervision while standing to performe LB bath and dressing ADL Comments:  Initially, patient did not think she could perform bath and dress and asked that the nurse tech come "do it for her".  After encouragement, patient decided to perform BADL herself after set up and assist only if she needed it.     OT Goals(current goals can now be found in the care plan section) Progressing toward goals.    Visit Information  Last OT Received On: 02/21/13 Assistance Needed: +1 History of Present Illness: 72 y.o. female admitted to [facility] for hyperkalemia, acute on chronic diastolic and systolic heart failure, UTI, and weakness.      Cognition  Cognition Arousal/Alertness: Awake/alert Behavior During Therapy: WFL for tasks assessed/performed Overall Cognitive Status: Within Functional Limits for tasks assessed    Mobility  Transfers Sit to Stand: With upper extremity assist;4: Min assist;From chair/3-in-1 Stand to Sit: With upper extremity assist;With armrests;To chair/3-in-1;5: Supervision Details for Transfer Assistance: Pt requires supervision due to neuropathy and LE weakness.    Balance Static Standing Balance Static Standing - Level of Assistance: 5: Stand by assistance (static>dynamic during LN bath and dress)   End of Session OT - End of Session Activity Tolerance: Patient tolerated treatment well Patient left: in chair;with call bell/phone within reach  GO     Rahkim Rabalais 02/21/2013, 1:22 PM

## 2013-02-21 NOTE — Progress Notes (Signed)
Patient is being d/c to [facility] [place] via EMS.  Report was called to receiving nurse at [facility].  Print out was sent with paperwork for nursing home on what medications were given.  IV d/c, tele d/c.

## 2013-02-21 NOTE — Discharge Summary (Addendum)
Patient ID: Miranda [name],  MRN: [id number], DOB/AGE: 73-Oct-1942 73 y.o.  Admit date: 02/13/2013 Discharge date: 02/21/2013  Primary Care Provider: Rene Paci Primary Cardiologist: J. Hochrein, MD   Discharge Diagnoses Principal Problem:   Chronic combined systolic and diastolic heart failure  **Net negative diuresis of 5.8 Liters this admission with reduction in weight from 142 lbs to 133 lbs.  Active Problems:   Hyperkalemia  **Potassium >7.5 on 6/23 requiring correction.  **Prevented initiation of ACEI/ARB.   Cardiac amyloidosis  **EF 20-25%   Ventricular tachycardia, sustained and nonsustained  **In setting of hyperkalemia and requiring cardioversion on 6/23.   Hypotension   UTI (urinary tract infection)  **Treated this admission.   DYSLIPIDEMIA  Allergies Allergies  Allergen Reactions  . Statins Other (See Comments)    Leg cramps and restlessness   Procedures  02/13/2013 Cardioversion for Ventricular Tachycardia  History of Present Illness  72 year old female with history of amyloid cardiomyopathy as well as persistent atrial fibrillation and relative hypotension who was in her usual state of health until approximately one week prior to admission when she began to experience progressive weakness and fatigue as well as lower extremity "heaviness", and 5 Pound weight gain. She was also experiencing some dyspnea. Because of these reasons, she presented to the office on June 23 and given her multiple complaints, it was felt that she be best served by admission to the hospital for further evaluation.  Hospital Course  Following hospital arrival, patient's ECG showed a wide-complex tachycardia consistent with ventricular tachycardia. Patient was hemodynamically stable. Laboratory evaluation revealed hyperkalemia with potassium of greater than 7.5.  Potassium was corrected and she required cardioversion with subsequent conversion to sinus rhythm though she later went back  into atrial fibrillation, which is her usual baseline rhythm.  She was seen by electrophysiology during this admission, and after some discussion, it was felt that due to her poor prognosis, ICD placement was not necessarily in her best interest.  We will plan to carry on this discussion as an outpatient.  From a heart failure standpoint, she was initially maintained on her home dose of torsemide.  Due to hypotension, this was reduced to 40mg  bid.  In all, she diuresed 5.8 Liters (negative), and her weight was reduced from 142 lbs at admission to 133 lbs at discharge.  With this diuresis, she did develop hypokalemia and required reinstitution of oral potassium replacement.  Potassium has since been stable.    Despite clinical indicators of improvement, Ms. Karger continued to feel weak.  She was seen by both PT and OT with recommendation for Skilled Nursing Facility placement.  She was also seen by Palliative Care who recommended further consideration for palliative services if the patient went to a skilled nursing facility.  At this time however, the patient wishes to remain a Full Code.  She is being discharged to a SNF today with plan for early f/u in our clinic next week.  She will require INR f/u @ the SNF later this week.  Of note, urinalysis was obtained and revealed a urinary tract infection. Urine culture showed multiple bacterial morphotypes. She was treated with ciprofloxacin x6 days.  Discharge Vitals Blood pressure 102/78, pulse 99, temperature 97.3 F (36.3 C), temperature source Oral, resp. rate 19, height 5\' 3"  (1.6 m), weight 133 lb 4.8 oz (60.464 kg), last menstrual period 08/04/1991, SpO2 95.00%.  Filed Weights   02/19/13 0648 02/20/13 0528 02/21/13 0534  Weight: 132 lb 1.6 oz (59.92 kg) 132 lb  12.8 oz (60.238 kg) 133 lb 4.8 oz (60.464 kg)   Labs  CBC  Recent Labs  02/21/13 0637  WBC 8.7  HGB 14.8  HCT 45.2  MCV 96.4  PLT 226   Basic Metabolic Panel  Recent Labs   16/10/96 0710 02/21/13 0637  NA 138 138  K 4.4 4.7  CL 98 98  CO2 30 32  GLUCOSE 95 93  BUN 31* 31*  CREATININE 1.28* 1.32*  CALCIUM 8.4 8.7   Liver Function Tests  Recent Labs  02/20/13 1907 02/21/13 0637  AST 31 30  ALT 30 29  ALKPHOS 93 89  BILITOT 0.7 0.6  PROT 6.1 5.8*  ALBUMIN 2.7* 2.6*   Cardiac Enzymes Lab Results  Component Value Date   TROPONINI <0.30 02/13/2013   Thyroid Function Tests Lab Results  Component Value Date   TSH 4.090 02/13/2013   Lab Results  Component Value Date   INR 2.17* 02/21/2013   INR 2.05* 02/20/2013   INR 1.92* 02/19/2013   Disposition  Pt is being discharged home today in good condition.  Follow-up Plans & Appointments      Follow-up Information   Follow up with Rollene Rotunda, MD On 02/28/2013. (3:15 PM)    Contact information:   [address] [place] [address] [phone]       Follow up with INR On 02/23/2013. (Please Fax Results to S. Putt PharmD @ [phone])      Discharge Medications    Medication List    STOP taking these medications       potassium chloride 10 MEQ CR capsule  Commonly known as:  MICRO-K      TAKE these medications       acetaminophen 500 MG tablet  Commonly known as:  TYLENOL  Take 500 mg by mouth daily as needed for pain.     ALIGN PO  Take 1 capsule by mouth daily.     BEANO PO  Take 1 tablet by mouth daily as needed (gas).     calcitRIOL 0.25 MCG capsule  Commonly known as:  ROCALTROL  Take 0.25 mcg by mouth daily.     CARNATION INSTANT BREAKFAST PO  Take 1 Package by mouth 2 (two) times daily.     cyanocobalamin 1000 MCG/ML injection  Commonly known as:  (VITAMIN B-12)  Inject 1,000 mcg into the muscle every 30 (thirty) days. Last dose 01/16/13     cyclobenzaprine 5 MG tablet  Commonly known as:  FLEXERIL  Take 5 mg by mouth at bedtime as needed for muscle spasms.     diflunisal 500 MG Tabs  Commonly known as:  DOLOBID  Take 250 mg by mouth  2 (two) times daily.     fluticasone 50 MCG/ACT nasal spray  Commonly known as:  FLONASE  Place 2 sprays into the nose daily.     gabapentin 100 MG capsule  Commonly known as:  NEURONTIN  Take 100-200 mg by mouth. 100 mg every morning and 200 mg in the afternoon.  (sometimes takes 300 mg at once if stomach pain is bad)     loperamide 2 MG capsule  Commonly known as:  IMODIUM  Take 2 mg by mouth 2 (two) times daily.     metolazone 2.5 MG tablet  Commonly known as:  ZAROXOLYN  Take 2.5 mg by mouth daily as needed (for excessive weight gain to to fluid).     midodrine 10 MG tablet  Commonly known as:  PROAMATINE  Take 0.5 tablets (5 mg total) by mouth 3 (three) times daily.     multivitamin with minerals Tabs  Take 1 tablet by mouth daily.     NEXIUM 40 MG capsule  Generic drug:  esomeprazole  Take 40 mg by mouth 2 (two) times daily as needed (acid reflux). Usually takes once daily, sometimes twice     potassium chloride SA 20 MEQ tablet  Commonly known as:  K-DUR,KLOR-CON  Take 1 tablet (20 mEq total) by mouth 2 (two) times daily.     prednisoLONE acetate 1 % ophthalmic suspension  Commonly known as:  PRED FORTE  Place 1 drop into the right eye See admin instructions. 4 times daily after surgery, then tapered dose:  3 x daily for 1 week (started this dose on 02/10/13), 2 x daily for 1 week, 1 x daily for 1 week, then stop     SIMETHICONE PO  Take 1 tablet by mouth daily as needed (for gas). Simethicone Extra Strength - over the counter - pt not sure of dose     STOOL SOFTENER 100 MG capsule  Generic drug:  docusate sodium  Take 100 mg by mouth daily as needed for constipation.     torsemide 20 MG tablet  Commonly known as:  DEMADEX  Take 2 tablets (40 mg total) by mouth 2 (two) times daily.     warfarin 2.5 MG tablet  Commonly known as:  COUMADIN  Take 1.25-2.5 mg by mouth every evening. Take 1/2 tablet (1.25 mg) on Mondays and 1 tablet (2.5 mg) on all other days        Outstanding Labs/Studies  Follow-up INR  Duration of Discharge Encounter   Greater than 30 minutes including physician time.  Signed, Nicolasa Ducking NP 02/21/2013, 1:12 PM   Patient seen and examined.  Plan as discussed in my rounding note for today and outlined above. Rollene Rotunda  02/21/2013  5:40 PM

## 2013-02-22 NOTE — Clinical Social Work Placement (Signed)
     Clinical Social Work Department CLINICAL SOCIAL WORK PLACEMENT NOTE 02/22/2013  Patient:  Miranda [name], Miranda [name]  Account Number:  [id number] Admit date:  02/13/2013  Clinical Social Worker:  Lupita Leash Altheia Shafran, LCSWA  Date/time:  02/21/2013 02:45 PM  Clinical Social Work is seeking post-discharge placement for this patient at the following level of care:   SKILLED NURSING   (*CSW will update this form in Epic as items are completed)   02/20/2013  Patient/family provided with [facility] Department of Clinical Social Works list of facilities offering this level of care within the geographic area requested by the patient (or if unable, by the patients family).  02/20/2013  Patient/family informed of their freedom to choose among providers that offer the needed level of care, that participate in Medicare, Medicaid or managed care program needed by the patient, have an available bed and are willing to accept the patient.  02/20/2013  Patient/family informed of [facility] ownership interest in [facility], as well as of the fact that they are under no obligation to receive care at this facility.  PASARR submitted to EDS on 02/20/2013 PASARR number received from EDS on 02/20/2013  FL2 transmitted to all facilities in geographic area requested by pt/family on  02/20/2013 FL2 transmitted to all facilities within larger geographic area on   Patient informed that his/her managed care company has contracts with or will negotiate with  certain facilities, including the following:   [facility]- Medicare complete     Patient/family informed of bed offers received:  02/21/2013 Patient chooses bed at [facility] [place] Physician recommends and patient chooses bed at    [facility] [place] on  02/21/2013 Patient to be transferred to facility by Ambulance  Sharin Mons)  The following physician request were entered in  Epic:   Additional Comments: 02/21/13  Patient and her son are pleased with dc plan. Notified SNF and pt's nurse to give report. No further CSW needs identified. CSW signing off.  Lorri Frederick. Dashawn Golda, LCSWA [phone]

## 2013-02-22 NOTE — Clinical Social Work Psychosocial (Addendum)
    Clinical Social Work Department BRIEF PSYCHOSOCIAL ASSESSMENT 02/22/2013  Patient:  Miranda [name], Miranda [name]     Account Number:  [id number]     Admit date:  02/13/2013  Clinical Social Worker:  Tiburcio Pea  Date/Time:  02/20/2013 05:00 PM  Referred by:  Physician  Date Referred:  02/20/2013 Referred for  SNF Placement   Other Referral:   Interview type:  Patient Other interview type:    PSYCHOSOCIAL DATA Living Status:  ALONE Admitted from facility:   Level of care:   Primary support name:  Miranda [name]  [phone] Primary support relationship to patient:  CHILD, ADULT Degree of support available:   Strong support    CURRENT CONCERNS Current Concerns  Post-Acute Placement   Other Concerns:    SOCIAL WORK ASSESSMENT / PLAN CSW met with patient- she lives alone and had planned to return home with Home health but PT is now recommending short term SNF.  CSW discussed with patient including bed search process. She hoped for d/c to [facility] but they are unable to offer a bed. Patient was dissappointed.  She was given other bed choices and chose [facility] of [address]. Plan d/c tomorrow if medically stable.  Fl2 on chart for MD's signature.   Assessment/plan status:  Psychosocial Support/Ongoing Assessment of Needs Other assessment/ plan:   Information/referral to community resources:   SNF bed list provided  Discussed Wound Care Clinic and Home Health    PATIENT'S/FAMILY'S RESPONSE TO PLAN OF CARE: Patient is alert, oriented and extremely pleasant. She has been very independent prior to t his hospital stay and still drives.  She is hopeful to return home as soon as possible. Has a very positive attitude regarding short term SNF. Patient was appreciative of CSW's asisstance. Shes stated that she would keep her son- [name] notified of her d/c plans.

## 2013-02-22 NOTE — Telephone Encounter (Signed)
After D/C from the hospital;  pt was sent To [facility] facility in [place].

## 2013-02-23 ENCOUNTER — Ambulatory Visit: Payer: Medicare Other

## 2013-02-23 ENCOUNTER — Encounter (HOSPITAL_BASED_OUTPATIENT_CLINIC_OR_DEPARTMENT_OTHER): Payer: Medicare Other | Attending: Internal Medicine

## 2013-02-23 DIAGNOSIS — I87309 Chronic venous hypertension (idiopathic) without complications of unspecified lower extremity: Secondary | ICD-10-CM | POA: Insufficient documentation

## 2013-02-23 DIAGNOSIS — I872 Venous insufficiency (chronic) (peripheral): Secondary | ICD-10-CM | POA: Insufficient documentation

## 2013-02-23 DIAGNOSIS — L97809 Non-pressure chronic ulcer of other part of unspecified lower leg with unspecified severity: Secondary | ICD-10-CM | POA: Insufficient documentation

## 2013-02-28 ENCOUNTER — Encounter: Payer: Self-pay | Admitting: Cardiology

## 2013-02-28 ENCOUNTER — Ambulatory Visit (INDEPENDENT_AMBULATORY_CARE_PROVIDER_SITE_OTHER): Payer: Medicare Other | Admitting: Cardiology

## 2013-02-28 VITALS — BP 100/80 | HR 82 | Wt 138.0 lb

## 2013-02-28 DIAGNOSIS — I472 Ventricular tachycardia: Secondary | ICD-10-CM

## 2013-02-28 DIAGNOSIS — I498 Other specified cardiac arrhythmias: Secondary | ICD-10-CM

## 2013-02-28 DIAGNOSIS — I5042 Chronic combined systolic (congestive) and diastolic (congestive) heart failure: Secondary | ICD-10-CM

## 2013-02-28 NOTE — Progress Notes (Signed)
HPI The patient presents after followup of her recent hospitalization. This is a transition of care visit. She was admitted with weakness and on presentation was found to have renal failure with hyperkalemia. She subsequently developed ventricular tachycardia and required cardioversion. She actually converted to sinus rhythm. Her renal function improved. Her hyperkalemia was treated. She's been getting physical therapy. Unfortunately they are not waking her daily. She's keeping her feet down. Her weight seems to be significantly. She's not describing any chest pressure, neck or arm discomfort. She's not having any new palpitations. She has had a little lightheadedness but no presyncope or syncope.   Allergies  Allergen Reactions  . Statins Other (See Comments)    Leg cramps and restlessness    Current Outpatient Prescriptions  Medication Sig Dispense Refill  . acetaminophen (TYLENOL) 500 MG tablet Take 500 mg by mouth daily as needed for pain.       . Alpha-D-Galactosidase (BEANO PO) Take 1 tablet by mouth daily as needed (gas).      . calcitRIOL (ROCALTROL) 0.25 MCG capsule Take 0.25 mcg by mouth daily.      . cyanocobalamin (,VITAMIN B-12,) 1000 MCG/ML injection Inject 1,000 mcg into the muscle every 30 (thirty) days. Last dose 01/16/13      . cyclobenzaprine (FLEXERIL) 5 MG tablet Take 5 mg by mouth at bedtime as needed for muscle spasms.       . diflunisal (DOLOBID) 500 MG TABS Take 250 mg by mouth 2 (two) times daily.       Marland Kitchen docusate sodium (STOOL SOFTENER) 100 MG capsule Take 100 mg by mouth daily as needed for constipation.       Marland Kitchen esomeprazole (NEXIUM) 40 MG capsule Take 40 mg by mouth 2 (two) times daily as needed (acid reflux). Usually takes once daily, sometimes twice      . fluticasone (FLONASE) 50 MCG/ACT nasal spray Place 2 sprays into the nose daily.  16 g  2  . gabapentin (NEURONTIN) 100 MG capsule Take 100-200 mg by mouth. 100 mg every morning and 200 mg in the afternoon.   (sometimes takes 300 mg at once if stomach pain is bad)      . loperamide (IMODIUM) 2 MG capsule Take 2 mg by mouth 2 (two) times daily.       . metolazone (ZAROXOLYN) 2.5 MG tablet Take 2.5 mg by mouth daily as needed (for excessive weight gain to to fluid).      . midodrine (PROAMATINE) 10 MG tablet Take 0.5 tablets (5 mg total) by mouth 3 (three) times daily.      . Multiple Vitamin (MULTIVITAMIN WITH MINERALS) TABS Take 1 tablet by mouth daily.      . potassium chloride (K-DUR,KLOR-CON) 20 MEQ tablet Take 1 tablet (20 mEq total) by mouth 2 (two) times daily.  60 tablet  3  . prednisoLONE acetate (PRED FORTE) 1 % ophthalmic suspension Place 1 drop into the right eye See admin instructions. 4 times daily after surgery, then tapered dose:  3 x daily for 1 week (started this dose on 02/10/13), 2 x daily for 1 week, 1 x daily for 1 week, then stop      . Probiotic Product (ALIGN PO) Take 1 capsule by mouth daily.       Marland Kitchen SIMETHICONE PO Take 1 tablet by mouth daily as needed (for gas). Simethicone Extra Strength - over the counter - pt not sure of dose      . torsemide (DEMADEX) 20  MG tablet Take 2 tablets (40 mg total) by mouth 2 (two) times daily.  120 tablet  3  . warfarin (COUMADIN) 2.5 MG tablet Take 1.25-2.5 mg by mouth every evening. Take 1/2 tablet (1.25 mg) on Mondays and 1 tablet (2.5 mg) on all other days       No current facility-administered medications for this visit.    Past Medical History  Diagnosis Date  . Lactose intolerance   . IBS (irritable bowel syndrome)   . Dyslipidemia   . 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 Kidney stone     R ureteral stone  . Coronary artery disease   . S/P hemorrhoidectomy   . Arthritis   . Carotid stenosis, bilateral PER DUPLEX  04-10-2011    RICA  40-59%/  LICA 60-79%  . Cardiomyopathy secondary     EF 25% echo 2013  . Atrial fibrillation     chronic anticoag   . Hypercholesterolemia   . Spinal stenosis   . Neuromuscular disorder     neuropathy d/t amyloidosis  . ANXIETY   . DEPRESSION   . Branch retinal vein occlusion of left eye     12/2011 event - vision improving - Following with optho and retinal specialist for same    Past Surgical History  Procedure Laterality Date  . Hemorrhoid surgery  2002  . Carpal tunnel release  2005    RIGHT  . Cysto/ bladder bx and fulgeration  02-21-2007  . Right sural nerve bx/ right gastrocemius muscle bx  09-25-2009  . Breast biopsy  1992  . Appendectomy  1961  . Extracorporeal shock wave lithotripsy  08-10-2011    RIGHT  . Cystoscopy  09/2011  . Tonsillectomy  10-23-11    child  . Cystoscopy with ureteroscopy  10/28/2011    Procedure: CYSTOSCOPY WITH URETEROSCOPY;  Surgeon: Milford Cage, MD;  Location: [facility];  Service: Urology;  Laterality: Right;  . Eye surgery      ROS: As stated in the HPI and negative for all other systems.   PHYSICAL EXAM BP 100/80  Pulse 82  Wt 138 lb (62.596 kg)  BMI 24.45 kg/m2  LMP 08/04/1991 GEN: No distress, frail appearing NECK: Jugular venous distention to the jaw at 90 degrees, waveform within normal limits, carotid upstroke brisk and symmetric, no bruits, no thyromegaly  LUNGS: Clear to auscultation bilaterally  BACK: No CVA tenderness  CHEST: Unremarkable  HEART: S1 and S2 within normal limits, no S3, no clicks, no rubs, no murmurs, irregular  ABD: Positive bowel sounds normal in frequency in pitch, no bruits, no rebound, no guarding, unable to assess midline mass or bruit with the patient seated.  EXT: 2 plus pulses throughout, severe bilateral edema in her calfs reduced from previous, no cyanosis no clubbing  NEURO: Diffuse weakness and muscle wasting nonfocal otherwise  SKIN: No rashes, bilateral legs are both bandaged via the wound clinic.  EKG:  Atrial fibrillation with right bundle branch block unchanged from previous. 02/28/2013  ASSESSMENT  AND PLAN   Weakness This is improved.  She will continue with PT.   Chronic Combined Systolic and Diastolic CHF 2/2 Amyloid Cardiomyopathy  Her volume seems to be increased.    She needs to have daily weights.  She needs to keep her feet elevated.  I am going to increase the torsemide to 60 mg twice daily. For the next 2 days she'll  need to take Zaroxolyn. We will need to check a basic metabolic profile again on Friday.  Of note during her hospitalization we did discuss end-of-life issues and she does not want hospice involved. However, she does not want to consider an ICD at this point and I think this would not be indicated given her frailty and and     Atrial fibrillation  She was in sinus rhythm after cardioversion in the hospital.  She appears to be back in atrial fibrillation but she is tolerating anticoagulation.  Hypotension  She will continue with the midodrine.    CKD  I will check a BMET

## 2013-02-28 NOTE — Patient Instructions (Addendum)
Please increase Torsemide to 60 mg twice a day Take Zaroxolyn 2.5 mg once a day for 2 days. Continue all other medications as listed  Please have blood work today and again on Friday (BMP)  Keep feet and legs elevated above the level of your heart as much as possible.  Weigh daily!!  1,500 cc fluid restriction  Keep I and O's daily  Follow up in 1 week with Dr Antoine Poche

## 2013-03-01 LAB — BASIC METABOLIC PANEL
Chloride: 100 mEq/L (ref 96–112)
GFR: 35.31 mL/min — ABNORMAL LOW (ref >60.00)
Potassium: 4.8 mEq/L (ref 3.5–5.1)
Sodium: 140 mEq/L (ref 135–145)

## 2013-03-03 ENCOUNTER — Encounter: Payer: Self-pay | Admitting: Cardiology

## 2013-03-07 ENCOUNTER — Ambulatory Visit (INDEPENDENT_AMBULATORY_CARE_PROVIDER_SITE_OTHER): Payer: Medicare Other | Admitting: Cardiology

## 2013-03-07 ENCOUNTER — Encounter: Payer: Self-pay | Admitting: Cardiology

## 2013-03-07 VITALS — BP 70/50 | HR 60 | Ht 64.0 in | Wt 120.4 lb

## 2013-03-07 DIAGNOSIS — I472 Ventricular tachycardia: Secondary | ICD-10-CM

## 2013-03-07 NOTE — Progress Notes (Signed)
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  She had a recent complicated hospitalization and has been followed in rehabilitation.  Since I last saw her she has lost 17 pounds. She had spironolactone added to her medications 3 days per week. She's did have her diuretic dose increased since the last visit as well. However, her by mouth intake is reduced in part because she has reflux symptoms. She wants very much to be out of rehabilitation. She says her legs are stronger than they were. She does overall feel weak but she's not reporting any presyncope or syncope. She's not having any chest pressure though she has "heartburn".  She has had some abdominal discomfort.   Allergies  Allergen Reactions  . Statins Other (See Comments)    Leg cramps and restlessness    Current Outpatient Prescriptions  Medication Sig Dispense Refill  . acetaminophen (TYLENOL) 500 MG tablet Take 500 mg by mouth daily as needed for pain.       . Alpha-D-Galactosidase (BEANO PO) Take 1 tablet by mouth daily as needed (gas).      . calcitRIOL (ROCALTROL) 0.25 MCG capsule Take 0.25 mcg by mouth daily.      . cyanocobalamin (,VITAMIN B-12,) 1000 MCG/ML injection Inject 1,000 mcg into the muscle every 30 (thirty) days. Last dose 01/16/13      . cyclobenzaprine (FLEXERIL) 5 MG tablet Take 5 mg by mouth at bedtime as needed for muscle spasms.       . diflunisal (DOLOBID) 500 MG TABS Take 250 mg by mouth 2 (two) times daily.       Marland Kitchen docusate sodium (STOOL SOFTENER) 100 MG capsule Take 100 mg by mouth daily as needed for constipation.       Marland Kitchen esomeprazole (NEXIUM) 40 MG capsule Take 40 mg by mouth 2 (two) times daily as needed (acid reflux). Usually takes once daily, sometimes twice      . fluticasone (FLONASE) 50 MCG/ACT nasal spray Place 2 sprays into the nose daily.  16 g  2  . gabapentin (NEURONTIN) 100 MG capsule Take 100-200 mg by mouth. 100 mg every morning and 200 mg in the afternoon.   (sometimes takes 300 mg at once if stomach pain is bad)      . hydrocortisone (ANUSOL-HC) 25 MG suppository Place 25 mg rectally 2 (two) times daily.      Marland Kitchen loperamide (IMODIUM) 2 MG capsule Take 2 mg by mouth 2 (two) times daily.       . metolazone (ZAROXOLYN) 2.5 MG tablet Take 2.5 mg by mouth daily as needed (for excessive weight gain to to fluid).      . midodrine (PROAMATINE) 10 MG tablet Take 0.5 tablets (5 mg total) by mouth 3 (three) times daily.      . Multiple Vitamin (MULTIVITAMIN WITH MINERALS) TABS Take 1 tablet by mouth daily.      . Pollen Extracts (PROSTAT PO) Take 30 mLs by mouth.      . potassium chloride (K-DUR,KLOR-CON) 20 MEQ tablet Take 1 tablet (20 mEq total) by mouth 2 (two) times daily.  60 tablet  3  . Probiotic Product (ALIGN PO) Take 1 capsule by mouth daily.       Marland Kitchen SIMETHICONE PO Take 1 tablet by mouth daily as needed (for gas). Simethicone Extra Strength - over the counter - pt not sure of dose      . spironolactone (ALDACTONE) 25 MG tablet Take 25 mg by mouth daily. 12.5  mg Tuesday,thursday,saturday      . torsemide (DEMADEX) 20 MG tablet Take 2 tablets (40 mg total) by mouth 2 (two) times daily.  120 tablet  3  . warfarin (COUMADIN) 2.5 MG tablet Take 1.25-2.5 mg by mouth every evening. Take 1/2 tablet (1.25 mg) on Mondays and 1 tablet (2.5 mg) on all other days       No current facility-administered medications for this visit.    Past Medical History  Diagnosis Date  . Lactose intolerance   . IBS (irritable bowel syndrome)   . Dyslipidemia   . 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 Kidney stone     R ureteral stone  . Coronary artery disease   . S/P hemorrhoidectomy   . Arthritis   . Carotid stenosis, bilateral PER DUPLEX  04-10-2011    RICA  40-59%/  LICA 60-79%  . Cardiomyopathy secondary     EF 25% echo 2013  . Atrial fibrillation     chronic anticoag  .  Hypercholesterolemia   . Spinal stenosis   . Neuromuscular disorder     neuropathy d/t amyloidosis  . ANXIETY   . DEPRESSION   . Branch retinal vein occlusion of left eye     12/2011 event - vision improving - Following with optho and retinal specialist for same    Past Surgical History  Procedure Laterality Date  . Hemorrhoid surgery  2002  . Carpal tunnel release  2005    RIGHT  . Cysto/ bladder bx and fulgeration  02-21-2007  . Right sural nerve bx/ right gastrocemius muscle bx  09-25-2009  . Breast biopsy  1992  . Appendectomy  1961  . Extracorporeal shock wave lithotripsy  08-10-2011    RIGHT  . Cystoscopy  09/2011  . Tonsillectomy  10-23-11    child  . Cystoscopy with ureteroscopy  10/28/2011    Procedure: CYSTOSCOPY WITH URETEROSCOPY;  Surgeon: Milford Cage, MD;  Location: [facility];  Service: Urology;  Laterality: Right;  . Eye surgery      ROS: As stated in the HPI and negative for all other systems.   PHYSICAL EXAM BP 70/40  Pulse 60  Ht 5\' 4"  (1.626 m)  Wt 120 lb 6.4 oz (54.613 kg)  BMI 20.66 kg/m2  LMP 08/04/1991 GEN: No distress, frail appearing NECK: Jugular venous distention to the jaw at 90 degrees, waveform within normal limits, carotid upstroke brisk and symmetric, no bruits, no thyromegaly  LUNGS: Clear to auscultation bilaterally  BACK: No CVA tenderness  CHEST: Unremarkable  HEART: S1 and S2 within normal limits, no S3, no clicks, no rubs, no murmurs, irregular  ABD: Positive bowel sounds normal in frequency in pitch, no bruits, no rebound, no guarding, unable to assess midline mass or bruit with the patient seated.  EXT: 2 plus pulses throughout, severe bilateral edema in her calfs reduced from previous, no cyanosis no clubbing  NEURO: Diffuse weakness and muscle wasting nonfocal otherwise  SKIN: No rashes, bilateral legs are both bandaged via the wound clinic.  EKG:  Atrial fibrillation with right bundle branch block unchanged from previous.  03/07/2013  ASSESSMENT AND PLAN   Weakness She will continue with PT.   She is adamant about going home and living by herself.  Certainly her hypotension is contributing. This will be addressed as below. 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 However, she wants to avoid hospitalization again. At the same time she's not wanting to discuss hospice for end-of-life issues. We did have palliative care consultation and she might be receptive to home health nursing and she would certainly need this and PT when she leaves rehabilitation. I think her prognosis long-term is very poor but again she is not wanting a DNR.  Today we will decrease the Demadex to 20 mg twice a day. We will be in communication with a nursing home over the next few days to see what her blood pressure does. She hasn't tolerated spironolactone in the past because of hypotension and we may need to discontinue this again. Blood work is pending already today. We will be following this closely.    Atrial fibrillation  She was in sinus rhythm after cardioversion in the hospital.  She appears to be back in atrial fibrillation but she is tolerating anticoagulation.  We will check an INR today.    Hypotension  She will continue with the midodrine.    CKD  We are following this with frequent labs.   MALNUTRITION I will ask her to use ensure.

## 2013-03-07 NOTE — Patient Instructions (Addendum)
Please decrease Demadex to 20 mg twice a day for 2 days then to 40 mg twice a day. Continue all other medications as listed.  Call with blood pressures for the rest of the week. [phone]   Follow up next week with Dr Antoine Poche

## 2013-03-14 ENCOUNTER — Telehealth: Payer: Self-pay | Admitting: Cardiology

## 2013-03-14 NOTE — Telephone Encounter (Signed)
Pt calling because left leg is more swollen since the wound center removed the wrap from that leg.  She wants to know if she should increase her fluid pills.  Advised to continue on medications as listed.  She should keep leg elevated as much as possible and follow up on Thursday at her appointment as scheduled.  She should call back tomorrow if swelling increases over night.  She states understanding.

## 2013-03-14 NOTE — Telephone Encounter (Signed)
New Prob  Pt states the compression on her left leg is gone and the leg has started swelling again.

## 2013-03-16 ENCOUNTER — Ambulatory Visit (INDEPENDENT_AMBULATORY_CARE_PROVIDER_SITE_OTHER): Payer: Medicare Other | Admitting: Cardiology

## 2013-03-16 ENCOUNTER — Telehealth: Payer: Self-pay | Admitting: Cardiology

## 2013-03-16 ENCOUNTER — Ambulatory Visit (INDEPENDENT_AMBULATORY_CARE_PROVIDER_SITE_OTHER): Payer: Medicare Other

## 2013-03-16 ENCOUNTER — Encounter: Payer: Self-pay | Admitting: Cardiology

## 2013-03-16 VITALS — BP 90/70 | HR 88 | Ht 64.0 in | Wt 148.0 lb

## 2013-03-16 DIAGNOSIS — I498 Other specified cardiac arrhythmias: Secondary | ICD-10-CM

## 2013-03-16 DIAGNOSIS — Z7901 Long term (current) use of anticoagulants: Secondary | ICD-10-CM

## 2013-03-16 DIAGNOSIS — I5042 Chronic combined systolic (congestive) and diastolic (congestive) heart failure: Secondary | ICD-10-CM

## 2013-03-16 LAB — BASIC METABOLIC PANEL
CO2: 30 mEq/L (ref 19–32)
Calcium: 8.8 mg/dL (ref 8.4–10.5)
Chloride: 98 mEq/L (ref 96–112)
Glucose, Bld: 105 mg/dL — ABNORMAL HIGH (ref 70–99)
Potassium: 4.9 mEq/L (ref 3.5–5.1)
Sodium: 137 mEq/L (ref 135–145)

## 2013-03-16 MED ORDER — WARFARIN SODIUM 2.5 MG PO TABS: ORAL_TABLET | ORAL | Status: DC

## 2013-03-16 NOTE — Telephone Encounter (Signed)
[facility] calling to notify Dr Antoine Poche of coumadin interaction.

## 2013-03-16 NOTE — Patient Instructions (Addendum)
Please increase your Demadex to 60 mg for two day, then resume normal dose. Continue all other medication as listed.  Please have blood work today and again on Monday at your primary care Doctor's office. (BMP)  Follow up with Norma Fredrickson in 2 weeks.

## 2013-03-16 NOTE — Telephone Encounter (Signed)
New problem   Calling to relay medications that interact to dr hochrein

## 2013-03-16 NOTE — Progress Notes (Signed)
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  She had a recent complicated hospitalization and has been followed in rehabilitation.  When I saw her a couple weeks ago her blood pressure was dramatically reduced and we backed off on her diuretics. Her labs have been fine. She's now back home after being in rehabilitation for a while. She continues to feel weak but she's not reporting any presyncope or syncope. She's not having any chest pressure.  She has had no symptomatic palpitations.     Allergies  Allergen Reactions  . Statins Other (See Comments)    Leg cramps and restlessness    Current Outpatient Prescriptions  Medication Sig Dispense Refill  . acetaminophen (TYLENOL) 500 MG tablet Take 500 mg by mouth daily as needed for pain.       . Alpha-D-Galactosidase (BEANO PO) Take 1 tablet by mouth daily as needed (gas).      . calcitRIOL (ROCALTROL) 0.25 MCG capsule Take 0.25 mcg by mouth daily.      . cyanocobalamin (,VITAMIN B-12,) 1000 MCG/ML injection Inject 1,000 mcg into the muscle every 30 (thirty) days. Last dose 01/16/13      . cyclobenzaprine (FLEXERIL) 5 MG tablet Take 5 mg by mouth at bedtime as needed for muscle spasms.       . diflunisal (DOLOBID) 500 MG TABS Take 250 mg by mouth 2 (two) times daily.       Marland Kitchen docusate sodium (STOOL SOFTENER) 100 MG capsule Take 100 mg by mouth daily as needed for constipation.       Marland Kitchen esomeprazole (NEXIUM) 40 MG capsule Take 40 mg by mouth 2 (two) times daily as needed (acid reflux). Usually takes once daily, sometimes twice      . fluticasone (FLONASE) 50 MCG/ACT nasal spray Place 2 sprays into the nose daily.  16 g  2  . gabapentin (NEURONTIN) 100 MG capsule Take 100-200 mg by mouth. 100 mg every morning and 200 mg in the afternoon.  (sometimes takes 300 mg at once if stomach pain is bad)      . hydrocortisone (ANUSOL-HC) 25 MG suppository Place 25 mg rectally 2 (two) times daily.      Marland Kitchen loperamide (IMODIUM) 2 MG  capsule Take 2 mg by mouth 2 (two) times daily.       . metolazone (ZAROXOLYN) 2.5 MG tablet Take 2.5 mg by mouth daily as needed (for excessive weight gain to to fluid).      . midodrine (PROAMATINE) 10 MG tablet Take 0.5 tablets (5 mg total) by mouth 3 (three) times daily.      . Multiple Vitamin (MULTIVITAMIN WITH MINERALS) TABS Take 1 tablet by mouth daily.      . Pollen Extracts (PROSTAT PO) Take 30 mLs by mouth.      . potassium chloride (K-DUR,KLOR-CON) 20 MEQ tablet Take 1 tablet (20 mEq total) by mouth 2 (two) times daily.  60 tablet  3  . Probiotic Product (ALIGN PO) Take 1 capsule by mouth daily.       Marland Kitchen SIMETHICONE PO Take 1 tablet by mouth daily as needed (for gas). Simethicone Extra Strength - over the counter - pt not sure of dose      . spironolactone (ALDACTONE) 25 MG tablet Take 25 mg by mouth daily. 12.5 mg Tuesday,thursday,saturday      . torsemide (DEMADEX) 20 MG tablet Take 2 tablets (40 mg total) by mouth 2 (two) times daily.  120 tablet  3  .  warfarin (COUMADIN) 2.5 MG tablet Take 1.25-2.5 mg by mouth every evening. Take 1/2 tablet (1.25 mg) on Mondays and 1 tablet (2.5 mg) on all other days       No current facility-administered medications for this visit.    Past Medical History  Diagnosis Date  . Lactose intolerance   . IBS (irritable bowel syndrome)   . Dyslipidemia   . 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 Kidney stone     R ureteral stone  . Coronary artery disease   . S/P hemorrhoidectomy   . Arthritis   . Carotid stenosis, bilateral PER DUPLEX  04-10-2011    RICA  40-59%/  LICA 60-79%  . Cardiomyopathy secondary     EF 25% echo 2013  . Atrial fibrillation     chronic anticoag  . Hypercholesterolemia   . Spinal stenosis   . Neuromuscular disorder     neuropathy d/t amyloidosis  . ANXIETY   . DEPRESSION   . Branch retinal vein occlusion of left eye     12/2011 event -  vision improving - Following with optho and retinal specialist for same    Past Surgical History  Procedure Laterality Date  . Hemorrhoid surgery  2002  . Carpal tunnel release  2005    RIGHT  . Cysto/ bladder bx and fulgeration  02-21-2007  . Right sural nerve bx/ right gastrocemius muscle bx  09-25-2009  . Breast biopsy  1992  . Appendectomy  1961  . Extracorporeal shock wave lithotripsy  08-10-2011    RIGHT  . Cystoscopy  09/2011  . Tonsillectomy  10-23-11    child  . Cystoscopy with ureteroscopy  10/28/2011    Procedure: CYSTOSCOPY WITH URETEROSCOPY;  Surgeon: Milford Cage, MD;  Location: [facility];  Service: Urology;  Laterality: Right;  . Eye surgery      ROS: As stated in the HPI and negative for all other systems.   PHYSICAL EXAM BP 90/70  Pulse 88  Ht 5\' 4"  (1.626 m)  Wt 148 lb (67.132 kg)  BMI 25.39 kg/m2  LMP 08/04/1991 GEN: No distress, frail appearing NECK: Jugular venous distention to the jaw at 90 degrees, waveform within normal limits, carotid upstroke brisk and symmetric, no bruits, no thyromegaly  LUNGS: Clear to auscultation bilaterally  BACK: No CVA tenderness  CHEST: Unremarkable  HEART: S1 and S2 within normal limits, no S3, no clicks, no rubs, no murmurs, irregular  ABD: Positive bowel sounds normal in frequency in pitch, no bruits, no rebound, no guarding, unable to assess midline mass or bruit with the patient seated.  EXT: 2 plus pulses throughout, increased edema in left leg NEURO: Diffuse weakness and muscle wasting nonfocal otherwise  SKIN: No rashes, bilateral legs are both bandaged via the wound clinic.  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  I will increase the Demadex to 60 mg BID for two days.    Atrial fibrillation  She was back in afib at the last visit.  She is tolerating anticoagulation.    Hypotension  She will continue with the midodrine.    CKD  We are following this with frequent labs. I will get a BMET today and again on Monday  MALNUTRITION She is going to try  to find Ensure pudding

## 2013-03-17 ENCOUNTER — Telehealth: Payer: Self-pay | Admitting: Cardiology

## 2013-03-17 ENCOUNTER — Telehealth: Payer: Self-pay | Admitting: *Deleted

## 2013-03-17 NOTE — Telephone Encounter (Signed)
Left msg on vm stating wanting to let md know went out yesterday for evaluation. Will start seeing pt 1 time a week for 1 week. Then 2 times a week for next 4 weeks. Will be working on daily living, training, mobility transfer training teaching and home exercise...Miranda [name]

## 2013-03-17 NOTE — Telephone Encounter (Signed)
Called donnie no answer LMOM with md response...Raechel Chute

## 2013-03-17 NOTE — Telephone Encounter (Signed)
Received another call from [facility] he states just wanted to modify plan of visits 1 time a week for 1 week, 2 times a week for 2 weeks, then 1 time a week for 2 weeks. Same plan of care...lmb

## 2013-03-17 NOTE — Telephone Encounter (Signed)
Reviewed coumadin dose with pt with her understanding.

## 2013-03-17 NOTE — Telephone Encounter (Signed)
New Prob      Pt has a questions regarding her coumadin dosage. Please call.

## 2013-03-17 NOTE — Telephone Encounter (Signed)
Noted change. Verbal okay from me

## 2013-03-20 ENCOUNTER — Ambulatory Visit (INDEPENDENT_AMBULATORY_CARE_PROVIDER_SITE_OTHER): Payer: Medicare Other | Admitting: Internal Medicine

## 2013-03-20 ENCOUNTER — Encounter: Payer: Self-pay | Admitting: Internal Medicine

## 2013-03-20 ENCOUNTER — Other Ambulatory Visit (INDEPENDENT_AMBULATORY_CARE_PROVIDER_SITE_OTHER): Payer: Medicare Other

## 2013-03-20 ENCOUNTER — Telehealth: Payer: Self-pay | Admitting: *Deleted

## 2013-03-20 VITALS — BP 100/68 | HR 94 | Temp 98.1°F | Wt 148.0 lb

## 2013-03-20 DIAGNOSIS — N058 Unspecified nephritic syndrome with other morphologic changes: Secondary | ICD-10-CM

## 2013-03-20 DIAGNOSIS — I5042 Chronic combined systolic (congestive) and diastolic (congestive) heart failure: Secondary | ICD-10-CM

## 2013-03-20 DIAGNOSIS — E852 Heredofamilial amyloidosis, unspecified: Secondary | ICD-10-CM

## 2013-03-20 DIAGNOSIS — E538 Deficiency of other specified B group vitamins: Secondary | ICD-10-CM

## 2013-03-20 DIAGNOSIS — E8589 Other amyloidosis: Secondary | ICD-10-CM

## 2013-03-20 DIAGNOSIS — G63 Polyneuropathy in diseases classified elsewhere: Secondary | ICD-10-CM

## 2013-03-20 DIAGNOSIS — R627 Adult failure to thrive: Secondary | ICD-10-CM

## 2013-03-20 LAB — BASIC METABOLIC PANEL
BUN: 53 mg/dL — ABNORMAL HIGH (ref 6–23)
Chloride: 98 mEq/L (ref 96–112)
GFR: 44.17 mL/min — ABNORMAL LOW (ref >60.00)
Glucose, Bld: 101 mg/dL — ABNORMAL HIGH (ref 70–99)
Potassium: 4.3 mEq/L (ref 3.5–5.1)
Sodium: 137 mEq/L (ref 135–145)

## 2013-03-20 MED ORDER — CYANOCOBALAMIN 1000 MCG/ML IJ SOLN
1000.0000 ug | Freq: Once | INTRAMUSCULAR | Status: AC
  Administered 2013-03-20: 1000 ug via INTRAMUSCULAR

## 2013-03-20 NOTE — Patient Instructions (Signed)
It was good to see you today. We have reviewed your prior records including labs and tests today Medications reviewed and updated, no changes recommended at this time. Suspect will need to increase demadex to "pre hospital" dose Test(s) ordered today. Your results will be released to MyChart (or called to you) after review, usually within 72hours after test completion. If any changes need to be made, you will be notified at that same time. Followup in 6 months for routine review, sooner if other problems

## 2013-03-20 NOTE — Progress Notes (Signed)
Subjective:    Patient ID: Miranda [name], female    DOB: [birth year], 72 y.o.   MRN: [id number]  HPI here for follow up - reviewed chronic medical issues and interval events today:  CHF exacerbation requiring hosp diuresis later 01/2013 -  Complicated by ventricular tachycardia requiring cardioversion June 23 in setting of hyperkalemia Remains weak since discharge, working with home health therapy Increase in weight and edema since home  Past Medical History  Diagnosis Date  . Lactose intolerance   . IBS (irritable bowel syndrome)   . Dyslipidemia   . 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 Kidney stone     R ureteral stone  . Coronary artery disease   . S/P hemorrhoidectomy   . Arthritis   . Carotid stenosis, bilateral PER DUPLEX  04-10-2011    RICA  40-59%/  LICA 60-79%  . Cardiomyopathy secondary     EF 25% echo 2013  . Atrial fibrillation     chronic anticoag  . Hypercholesterolemia   . Spinal stenosis   . Neuromuscular disorder     neuropathy d/t amyloidosis  . ANXIETY   . DEPRESSION   . Branch retinal vein occlusion of left eye     12/2011 event - vision improving - Following with optho and retinal specialist for same    Review of Systems  Constitutional: Negative for fever or weight change. +fatigue Respiratory: Negative for cough and shortness of breath.   Cardiovascular: Negative for chest pain or palpitations.      Objective:   Physical Exam  BP 100/68  Pulse 94  Temp(Src) 98.1 F (36.7 C) (Oral)  Wt 148 lb (67.132 kg)  BMI 25.39 kg/m2  SpO2 97%  LMP 08/04/1991 Wt Readings from Last 3 Encounters:  03/20/13 148 lb (67.132 kg)  03/16/13 148 lb (67.132 kg)  03/07/13 120 lb 6.4 oz (54.613 kg)   Constitutional: She is thin, but appears well-developed and well-nourished. No distress. Uses RW Neck: Normal range of motion. No JVD or LAD present. No thyromegaly present.   Cardiovascular: Irregular rate and rhythm and normal heart sounds.  No murmur heard. 2+ BLE edema, wearing compression hose. Pulmonary/Chest: Effort normal and breath sounds normal. No respiratory distress. She has no wheezes.  Neurological: She is alert and oriented to person, place, and time. No cranial nerve deficit. Coordination slow but normal (baseline).  Neurological: AAOx4, CN2-12 symmetrically intact - speech fluent, MAE well, balance at baseline Psychiatric: She has a normal mood and affect. Her behavior is normal. Judgment and thought content normal.   Lab Results  Component Value Date   WBC 8.7 02/21/2013   HGB 14.8 02/21/2013   HCT 45.2 02/21/2013   PLT 226 02/21/2013   GLUCOSE 105* 03/16/2013   CHOL 174 06/08/2012   TRIG 78.0 06/08/2012   HDL 59.80 06/08/2012   LDLCALC 99 06/08/2012   ALT 29 02/21/2013   AST 30 02/21/2013   NA 137 03/16/2013   K 4.9 03/16/2013   CL 98 03/16/2013   CREATININE 1.5* 03/16/2013   BUN 47* 03/16/2013   CO2 30 03/16/2013   TSH 4.090 02/13/2013   INR 3.2 03/16/2013    Dg Lumbar Spine Complete  09/14/2012  *RADIOLOGY REPORT*  Clinical Data: Fall, low back pain.  LUMBAR SPINE - COMPLETE 4+ VIEW  Comparison: 10/19/2011  Findings: Levoscoliosis in the lumbar spine.  Degenerative disc disease and facet disease throughout the lumbar  spine.  No fracture or subluxation.  SI joints are symmetric and unremarkable.  IMPRESSION: Levoscoliosis and spondylosis.  No acute findings.   Original Report Authenticated By: Charlett Nose, M.D.     Ct Head Wo Contrast  09/14/2012  *RADIOLOGY REPORT*  Clinical Data:  Fall  CT HEAD WITHOUT CONTRAST CT CERVICAL SPINE WITHOUT CONTRAST  Technique:  Multidetector CT imaging of the head and cervical spine was performed following the standard protocol without intravenous contrast.  Multiplanar CT image reconstructions of the cervical spine were also generated.  Comparison:  06/07/2008  CT HEAD  Findings: No skull fracture is noted.  Paranasal  sinuses and mastoid air cells are unremarkable.  No intracranial hemorrhage, mass effect or midline shift.  Ventricular size is stable from prior exam.  Mild cerebral atrophy.  No acute infarction.  No mass lesion is noted on this unenhanced scan.  IMPRESSION: No acute intracranial abnormality.  CT CERVICAL SPINE  Findings: Axial images of the cervical spine shows no acute fracture or subluxation.  There is no pneumothorax in visualized lung apices.  Air is noted in the upper esophagus.  Computer processed images shows no acute fracture or subluxation.  There is disc space flattening with mild anterior spurring and mild posterior disc bulge at C3-C4 level.  Disc space flattening with mild anterior and mild posterior spurring and mild disc bulge at C5- C6 and C6-C7 level.  No prevertebral soft tissue swelling. Cervical airway is patent.  Mild emphysematous changes are noted in the right apex.  IMPRESSION: No acute fracture or subluxation.  Degenerative changes as described above.   Original Report Authenticated By: Natasha Mead, M.D.       Assessment & Plan:  See problem list. Medications and labs reviewed today.  FTT - multifactorial, exacerbated by acute illness and hospitalization requiring skilled nursing rehabilitation. Continue working on stabilization of acute cardiac and renal issues. Medication adjustment ongoing. Continue home health therapy PT and OT - support offered  Time spent with pt today 25 minutes, greater than 50% time spent counseling patient on diabetes, depression and medication review. Also review of prior records

## 2013-03-20 NOTE — Telephone Encounter (Signed)
Ok thanks 

## 2013-03-20 NOTE — Assessment & Plan Note (Signed)
Ongoing off label use diflunisal for same HX again reviewed today including complications and progression of disease Increased gabapentin 08/2012 for GI symptoms and neuropathy -  Follows for "IBS" at tertiary center [facility]) No other tx changes recommended -  To continue follow up with local neuro and Randell Loop for other tx needs as they arise

## 2013-03-20 NOTE — Telephone Encounter (Signed)
Left msg on vm requesting verbal order for PT 1 times a week for 1 week, then two times a week for 4 weeks...Raechel Chute

## 2013-03-20 NOTE — Assessment & Plan Note (Signed)
Dx 2008 - related to amyloidosis Recent exacerbation requiring hospitalization x 1 week end June 2014 - med changes reviewed Increase in peripheral edema since home - prev on Lasix, but changed to torsemide by cards 2013 Prior to hospitalization 01/2013 on 60mg  bid - ?resume same if Cr ok - defer to cards Wearing compression hose, knee high Follows closely with cards for same (hochrein)

## 2013-03-21 ENCOUNTER — Telehealth: Payer: Self-pay | Admitting: Cardiology

## 2013-03-21 NOTE — Telephone Encounter (Signed)
Follow Up     Pt has some questions regarding her medication. Please call.

## 2013-03-21 NOTE — Telephone Encounter (Signed)
Spoke with pt, aware of bmp results and the orders per dr Felicity Coyer. Pt made aware dr hochrein is not here. She voiced understanding of the increase in torsemide.

## 2013-03-21 NOTE — Telephone Encounter (Signed)
Notified PT with md response.../lmb 

## 2013-03-24 DIAGNOSIS — I5040 Unspecified combined systolic (congestive) and diastolic (congestive) heart failure: Secondary | ICD-10-CM

## 2013-03-24 DIAGNOSIS — N390 Urinary tract infection, site not specified: Secondary | ICD-10-CM

## 2013-03-24 DIAGNOSIS — E875 Hyperkalemia: Secondary | ICD-10-CM

## 2013-03-24 DIAGNOSIS — I4891 Unspecified atrial fibrillation: Secondary | ICD-10-CM

## 2013-03-29 ENCOUNTER — Telehealth: Payer: Self-pay | Admitting: *Deleted

## 2013-03-29 ENCOUNTER — Other Ambulatory Visit: Payer: Self-pay | Admitting: *Deleted

## 2013-03-29 DIAGNOSIS — I472 Ventricular tachycardia: Secondary | ICD-10-CM

## 2013-03-29 MED ORDER — SPIRONOLACTONE 25 MG PO TABS: 12.5000 mg | ORAL_TABLET | ORAL | Status: DC

## 2013-03-29 NOTE — Telephone Encounter (Signed)
Called [facility] no answer LMOM Regina response...Raechel Chute

## 2013-03-29 NOTE — Telephone Encounter (Signed)
Ok to use an aloe vera cream until it heals, then Vitamin E cream after it is healed to prevent scarring

## 2013-03-29 NOTE — Telephone Encounter (Signed)
Miranda [name] called requesting OTC burn medication; states pt has a small burn on her left temple from a curling iron.  please advise

## 2013-03-30 ENCOUNTER — Ambulatory Visit (INDEPENDENT_AMBULATORY_CARE_PROVIDER_SITE_OTHER): Payer: Medicare Other

## 2013-03-30 ENCOUNTER — Encounter (HOSPITAL_BASED_OUTPATIENT_CLINIC_OR_DEPARTMENT_OTHER): Payer: Medicare Other | Attending: Internal Medicine

## 2013-03-30 DIAGNOSIS — I87319 Chronic venous hypertension (idiopathic) with ulcer of unspecified lower extremity: Secondary | ICD-10-CM | POA: Insufficient documentation

## 2013-03-30 DIAGNOSIS — L97909 Non-pressure chronic ulcer of unspecified part of unspecified lower leg with unspecified severity: Secondary | ICD-10-CM | POA: Insufficient documentation

## 2013-03-30 DIAGNOSIS — Z7901 Long term (current) use of anticoagulants: Secondary | ICD-10-CM

## 2013-03-30 DIAGNOSIS — I498 Other specified cardiac arrhythmias: Secondary | ICD-10-CM

## 2013-04-07 ENCOUNTER — Encounter: Payer: Self-pay | Admitting: Nurse Practitioner

## 2013-04-07 ENCOUNTER — Ambulatory Visit (INDEPENDENT_AMBULATORY_CARE_PROVIDER_SITE_OTHER): Payer: Medicare Other | Admitting: Nurse Practitioner

## 2013-04-07 VITALS — BP 80/60 | HR 84 | Ht 64.5 in | Wt 133.8 lb

## 2013-04-07 DIAGNOSIS — R0600 Dyspnea, unspecified: Secondary | ICD-10-CM

## 2013-04-07 DIAGNOSIS — I428 Other cardiomyopathies: Secondary | ICD-10-CM

## 2013-04-07 DIAGNOSIS — R0609 Other forms of dyspnea: Secondary | ICD-10-CM

## 2013-04-07 DIAGNOSIS — I872 Venous insufficiency (chronic) (peripheral): Secondary | ICD-10-CM

## 2013-04-07 DIAGNOSIS — I420 Dilated cardiomyopathy: Secondary | ICD-10-CM

## 2013-04-07 LAB — BASIC METABOLIC PANEL
BUN: 36 mg/dL — ABNORMAL HIGH (ref 6–23)
CO2: 34 mEq/L — ABNORMAL HIGH (ref 19–32)
Calcium: 8.6 mg/dL (ref 8.4–10.5)
Chloride: 94 mEq/L — ABNORMAL LOW (ref 96–112)
Creatinine, Ser: 1.2 mg/dL (ref 0.4–1.2)
GFR: 48.59 mL/min — ABNORMAL LOW (ref >60.00)
Glucose, Bld: 85 mg/dL (ref 70–99)
Potassium: 3.4 mEq/L — ABNORMAL LOW (ref 3.5–5.1)
Sodium: 139 mEq/L (ref 135–145)

## 2013-04-07 LAB — BRAIN NATRIURETIC PEPTIDE: Pro B Natriuretic peptide (BNP): 871 pg/mL — ABNORMAL HIGH (ref 0.0–100.0)

## 2013-04-07 NOTE — Progress Notes (Signed)
Miranda [name] Date of Birth: [birth year] Medical Record #[id number]  History of Present Illness: Miranda [name] is seen back today for a 2 week check. Seen for Dr. Antoine Poche. 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 Had a recent complicated hospitalization with a heart failure exacerbation associated with VT (cardioverted back to atrial fib) and had to go to rehab, now back home. 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 Overall prognosis looks poor - has had discussion for palliative care but wishes to stay a full code. ICD implant not felt to be appropriate.   Seen 2 weeks ago - demadex increased for a few days, then back to her normal dose.   Comes back today. Here alone. In a wheelchair. Still pretty weak. Still dizzy at times. Has not eaten lunch today. Says her breathing is ok. Still with swelling in her legs. Legs are wrapped - continues to go to the wound clinic. Weight at home is steady but her weight here is down 15#. Next week will be the end of her PT. Nutrition still an issue. Not interested in ICD implant.   Current Outpatient Prescriptions  Medication Sig Dispense Refill  . acetaminophen (TYLENOL) 500 MG tablet Take 500 mg by mouth daily as needed for pain.       . Alpha-D-Galactosidase (BEANO PO) Take 1 tablet by mouth daily as needed (gas).      . calcitRIOL (ROCALTROL) 0.25 MCG capsule Take 0.25 mcg by mouth daily.      . cyanocobalamin (,VITAMIN B-12,) 1000 MCG/ML injection Inject 1,000 mcg into the muscle every 30 (thirty) days. Last dose 01/16/13      . cyclobenzaprine (FLEXERIL) 5 MG tablet Take 5 mg by mouth at bedtime as needed for muscle spasms.       . diflunisal (DOLOBID) 500 MG TABS Take 250 mg by mouth 2 (two) times daily.       Marland Kitchen docusate sodium (STOOL SOFTENER) 100 MG capsule Take 100 mg by mouth daily  as needed for constipation.       Marland Kitchen esomeprazole (NEXIUM) 40 MG capsule Take 40 mg by mouth 2 (two) times daily as needed (acid reflux). Usually takes once daily, sometimes twice      . fluticasone (FLONASE) 50 MCG/ACT nasal spray Place 2 sprays into the nose daily.  16 g  2  . gabapentin (NEURONTIN) 100 MG capsule Take 100-200 mg by mouth. 100 mg every morning and 200 mg in the afternoon.  (sometimes takes 300 mg at once if stomach pain is bad)      . loperamide (IMODIUM) 2 MG capsule Take 2 mg by mouth 2 (two) times daily.       . metolazone (ZAROXOLYN) 2.5 MG tablet Take 2.5 mg by mouth daily as needed (for excessive weight gain to to fluid).      . midodrine (PROAMATINE) 10 MG tablet Take 0.5 tablets (5 mg total) by mouth 3 (three) times daily.      . Multiple Vitamin (MULTIVITAMIN WITH MINERALS) TABS Take 1 tablet by mouth daily.      . Pollen Extracts (PROSTAT PO) Take 30 mLs by mouth.      . potassium chloride (K-DUR,KLOR-CON) 20 MEQ tablet Take 1 tablet (20 mEq total) by mouth 2 (two) times daily.  60 tablet  3  . Probiotic Product (ALIGN PO) Take 1  capsule by mouth daily.       Marland Kitchen SIMETHICONE PO Take 1 tablet by mouth daily as needed (for gas). Simethicone Extra Strength - over the counter - pt not sure of dose      . spironolactone (ALDACTONE) 25 MG tablet Take 0.5 tablets (12.5 mg total) by mouth as directed. 12.5 mg Tuesday,thursday,saturday  15 tablet  0  . torsemide (DEMADEX) 20 MG tablet Take 60 mg by mouth 2 (two) times daily.      Marland Kitchen warfarin (COUMADIN) 2.5 MG tablet Take 1/2 tablet (1.25 mg) on Monday, Wednesday and Friday and 1 tablet (2.5 mg) on all other days  30 tablet  1   No current facility-administered medications for this visit.    Allergies  Allergen Reactions  . Statins Other (See Comments)    Leg cramps and restlessness    Past Medical History  Diagnosis Date  . Lactose intolerance   . IBS (irritable bowel syndrome)   . Dyslipidemia   . 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 Kidney stone     R ureteral stone  . Coronary artery disease   . S/P hemorrhoidectomy   . Arthritis   . Carotid stenosis, bilateral PER DUPLEX  04-10-2011    RICA  40-59%/  LICA 60-79%  . Cardiomyopathy secondary     EF 25% echo 2013  . Atrial fibrillation     chronic anticoag  . Hypercholesterolemia   . Spinal stenosis   . Neuromuscular disorder     neuropathy d/t amyloidosis  . ANXIETY   . DEPRESSION   . Branch retinal vein occlusion of left eye     12/2011 event - vision improving - Following with optho and retinal specialist for same    Past Surgical History  Procedure Laterality Date  . Hemorrhoid surgery  2002  . Carpal tunnel release  2005    RIGHT  . Cysto/ bladder bx and fulgeration  02-21-2007  . Right sural nerve bx/ right gastrocemius muscle bx  09-25-2009  . Breast biopsy  1992  . Appendectomy  1961  . Extracorporeal shock wave lithotripsy  08-10-2011    RIGHT  . Cystoscopy  09/2011  . Tonsillectomy  10-23-11    child  . Cystoscopy with ureteroscopy  10/28/2011    Procedure: CYSTOSCOPY WITH URETEROSCOPY;  Surgeon: Milford Cage, MD;  Location: [facility];  Service: Urology;  Laterality: Right;  . Eye surgery      History  Smoking status  . Former Smoker -- 20 years  . Types: Cigarettes  . Quit date: 10/28/1990  Smokeless tobacco  . Never Used    History  Alcohol Use No    Family History  Problem Relation Age of Onset  . Heart disease Mother     Heart failure at a later age  . Arthritis Mother   . Arthritis Father   . COPD Other   . Heart disease      Heart failure at a later age    Review of Systems: The review of systems is per the HPI.  All other systems were reviewed and are negative.  Physical Exam: BP 80/60  Pulse 84  Ht 5' 4.5" (1.638 m)  Wt 133 lb 12.8 oz (60.691 kg)  BMI 22.62 kg/m2  LMP 08/04/1991 BP by me is 90/60.    Patient is very pleasant and in no acute distress. Looks quite frail.  She is in a wheelchair. Skin is warm and dry. Color is normal.  HEENT is unremarkable. Normocephalic/atraumatic. PERRL. Sclera are nonicteric. Neck is supple. No masses. No JVD. Lungs are fairly clear with some rales in the bases. Cardiac exam shows an irregular rhythm. Rate is ok. Abdomen is soft. Extremities are with 1+ edema. Both legs are wrapped. Gait is not tested. No gross neurologic deficits noted.  LABORATORY DATA: Lab Results  Component Value Date   WBC 8.7 02/21/2013   HGB 14.8 02/21/2013   HCT 45.2 02/21/2013   PLT 226 02/21/2013   GLUCOSE 101* 03/20/2013   CHOL 174 06/08/2012   TRIG 78.0 06/08/2012   HDL 59.80 06/08/2012   LDLCALC 99 06/08/2012   ALT 29 02/21/2013   AST 30 02/21/2013   NA 137 03/20/2013   K 4.3 03/20/2013   CL 98 03/20/2013   CREATININE 1.3* 03/20/2013   BUN 53* 03/20/2013   CO2 30 03/20/2013   TSH 4.090 02/13/2013   INR 3.0 03/30/2013   Echo Study Conclusions from February 2013  - Left ventricle: Wall thickness was increased in a pattern of severe LVH. Systolic function was severely reduced. The estimated ejection fraction was in the range of 20% to 25%. Features are consistent with a pseudonormal left ventricular filling pattern, with concomitant abnormal relaxation and increased filling pressure (grade 2 diastolic dysfunction). - Mitral valve: Mild regurgitation. - Left atrium: The atrium was moderately dilated. - Right ventricle: The cavity size was mildly dilated. Systolic function was mildly to moderately reduced. - Right atrium: The atrium was mildly dilated. - Pulmonary arteries: PA peak pressure: 35mm Hg (S). - Pericardium, extracardiac: A trivial pericardial effusion was identified.  Wt Readings from Last 3 Encounters:  04/07/13 133 lb 12.8 oz (60.691 kg)  03/20/13 148 lb (67.132 kg)  03/16/13 148 lb (67.132 kg)     Assessment / Plan: 1. Chronic systolic HF with EF 20% from  amyloid - basically only on diuretic therapy. Overall prognosis tenuous at best. Still lives alone and tries to be independent. Recheck her labs today. Renal function has gotten worse - may actually need her diuretics cut back. Check BNP today. Seems to want an aggressive approach - we did NOT talk palliative care today. She does not wish to have an ICD placed. May need to consider CHF referral but I think her options are very limited.   2. Hypotension - on Midodrine  3. Atrial fib - managed with rate control and anticoagulation  4. Protein calorie malnutrition  5. Generalized weakness.   Not sure I have much to offer today. Check her labs and BNP today. See Dr. Antoine Poche as planned in early September. She will need close follow up but may need to consider palliative care.   Patient is agreeable to this plan and will call if any problems develop in the interim.   Rosalio Macadamia, RN, ANP-C [facility] [address] [place], [place]  [address]

## 2013-04-07 NOTE — Patient Instructions (Addendum)
We need to check labs today  See Dr. Antoine Poche in early September as planned  Continue with your current medicines.  Weigh yourself each morning and record.  Limit sodium intake. Goal is to have less than 2000 mg (2gm) of salt per day.  Call the [facility] office at [phone] if you have any questions, problems or concerns.

## 2013-04-13 ENCOUNTER — Ambulatory Visit (INDEPENDENT_AMBULATORY_CARE_PROVIDER_SITE_OTHER): Payer: Medicare Other | Admitting: *Deleted

## 2013-04-13 DIAGNOSIS — Z7901 Long term (current) use of anticoagulants: Secondary | ICD-10-CM

## 2013-04-13 DIAGNOSIS — I498 Other specified cardiac arrhythmias: Secondary | ICD-10-CM

## 2013-04-13 LAB — BASIC METABOLIC PANEL
BUN: 42 mg/dL — ABNORMAL HIGH (ref 6–23)
CO2: 29 mEq/L (ref 19–32)
Chloride: 99 mEq/L (ref 96–112)
Potassium: 3.3 mEq/L — ABNORMAL LOW (ref 3.5–5.1)

## 2013-04-13 LAB — POCT INR: INR: 2.2

## 2013-04-14 ENCOUNTER — Other Ambulatory Visit: Payer: Self-pay | Admitting: *Deleted

## 2013-04-14 DIAGNOSIS — N289 Disorder of kidney and ureter, unspecified: Secondary | ICD-10-CM

## 2013-04-17 ENCOUNTER — Other Ambulatory Visit: Payer: Medicare Other

## 2013-04-18 ENCOUNTER — Telehealth: Payer: Self-pay | Admitting: *Deleted

## 2013-04-18 NOTE — Telephone Encounter (Signed)
Pt called requesting a written referral to PT.  Pt further states she would like to pick it up at her Nurse Visit on 8.27.14.  Please advise

## 2013-04-19 ENCOUNTER — Ambulatory Visit: Payer: Medicare Other

## 2013-04-19 ENCOUNTER — Other Ambulatory Visit (INDEPENDENT_AMBULATORY_CARE_PROVIDER_SITE_OTHER): Payer: Medicare Other

## 2013-04-19 DIAGNOSIS — N289 Disorder of kidney and ureter, unspecified: Secondary | ICD-10-CM

## 2013-04-19 LAB — BASIC METABOLIC PANEL
BUN: 38 mg/dL — ABNORMAL HIGH (ref 6–23)
CO2: 32 mEq/L (ref 19–32)
Glucose, Bld: 86 mg/dL (ref 70–99)
Potassium: 3.2 mEq/L — ABNORMAL LOW (ref 3.5–5.1)
Sodium: 138 mEq/L (ref 135–145)

## 2013-04-19 NOTE — Telephone Encounter (Signed)
Ok - written - rx given to [employer]

## 2013-04-19 NOTE — Telephone Encounter (Signed)
Pt.notified

## 2013-04-25 ENCOUNTER — Ambulatory Visit (INDEPENDENT_AMBULATORY_CARE_PROVIDER_SITE_OTHER): Payer: Medicare Other | Admitting: Pharmacist

## 2013-04-25 ENCOUNTER — Ambulatory Visit (INDEPENDENT_AMBULATORY_CARE_PROVIDER_SITE_OTHER): Payer: Medicare Other | Admitting: Cardiology

## 2013-04-25 ENCOUNTER — Encounter: Payer: Self-pay | Admitting: Cardiology

## 2013-04-25 VITALS — BP 90/50 | HR 90 | Ht 64.5 in | Wt 137.0 lb

## 2013-04-25 DIAGNOSIS — E876 Hypokalemia: Secondary | ICD-10-CM

## 2013-04-25 DIAGNOSIS — Z7901 Long term (current) use of anticoagulants: Secondary | ICD-10-CM

## 2013-04-25 DIAGNOSIS — I498 Other specified cardiac arrhythmias: Secondary | ICD-10-CM

## 2013-04-25 DIAGNOSIS — E8589 Other amyloidosis: Secondary | ICD-10-CM

## 2013-04-25 LAB — BASIC METABOLIC PANEL
BUN: 45 mg/dL — ABNORMAL HIGH (ref 6–23)
CO2: 31 mEq/L (ref 19–32)
Chloride: 93 mEq/L — ABNORMAL LOW (ref 96–112)
Creatinine, Ser: 1.5 mg/dL — ABNORMAL HIGH (ref 0.4–1.2)
Glucose, Bld: 105 mg/dL — ABNORMAL HIGH (ref 70–99)
Potassium: 3.9 mEq/L (ref 3.5–5.1)

## 2013-04-25 NOTE — Progress Notes (Signed)
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  Since last saw her she has been dose adjusting her diuretics based on daily weights. We have been checking frequent blood work and adjusting her potassium as needed.  She has had no acute complaints. She denies any overt dyspnea with PND or orthopnea. She does occasionally feel some palpitations but she's had no presyncope or syncope. She is weighing herself religiously and watching her fluids and keeping her legs elevated. Her weight has been up a little bit and she did take a Zaroxolyn yesterday. She took 4 extra potassium with that.     Allergies  Allergen Reactions  . Statins Other (See Comments)    Leg cramps and restlessness    Current Outpatient Prescriptions  Medication Sig Dispense Refill  . acetaminophen (TYLENOL) 500 MG tablet Take 500 mg by mouth daily as needed for pain.       . Alpha-D-Galactosidase (BEANO PO) Take 1 tablet by mouth daily as needed (gas).      . calcitRIOL (ROCALTROL) 0.25 MCG capsule Take 0.25 mcg by mouth daily.      . cyanocobalamin (,VITAMIN B-12,) 1000 MCG/ML injection Inject 1,000 mcg into the muscle every 30 (thirty) days. Last dose 01/16/13      . cyclobenzaprine (FLEXERIL) 5 MG tablet Take 5 mg by mouth at bedtime as needed for muscle spasms.       . diflunisal (DOLOBID) 500 MG TABS Take 250 mg by mouth 2 (two) times daily.       Marland Kitchen docusate sodium (STOOL SOFTENER) 100 MG capsule Take 100 mg by mouth daily as needed for constipation.       Marland Kitchen esomeprazole (NEXIUM) 40 MG capsule Take 40 mg by mouth 2 (two) times daily as needed (acid reflux). Usually takes once daily, sometimes twice      . fluticasone (FLONASE) 50 MCG/ACT nasal spray Place 2 sprays into the nose daily.  16 g  2  . gabapentin (NEURONTIN) 100 MG capsule Take 100-200 mg by mouth. 100 mg every morning and 200 mg in the afternoon.  (sometimes takes 300 mg at once if stomach pain is bad)      . loperamide (IMODIUM) 2 MG  capsule Take 2 mg by mouth 2 (two) times daily.       . metolazone (ZAROXOLYN) 2.5 MG tablet Take 2.5 mg by mouth daily as needed (for excessive weight gain to to fluid).      . midodrine (PROAMATINE) 5 MG tablet 5 mg. Take 5 mg by mouth 3 times daily.      . Multiple Vitamin (MULTIVITAMIN WITH MINERALS) TABS Take 1 tablet by mouth daily.      . Pollen Extracts (PROSTAT PO) Take 30 mLs by mouth.      . potassium chloride (K-DUR) 10 MEQ tablet Take 30 mEq by mouth 2 (two) times daily.      . Probiotic Product (ALIGN PO) Take 1 capsule by mouth daily.       Marland Kitchen SIMETHICONE PO Take 1 tablet by mouth daily as needed (for gas). Simethicone Extra Strength - over the counter - pt not sure of dose      . spironolactone (ALDACTONE) 25 MG tablet Take 0.5 tablets (12.5 mg total) by mouth as directed. 12.5 mg Tuesday,thursday,saturday  15 tablet  0  . torsemide (DEMADEX) 20 MG tablet Take 60 mg by mouth 2 (two) times daily.      Marland Kitchen warfarin (COUMADIN) 2.5 MG tablet Take  1/2 tablet (1.25 mg) on Monday, Wednesday and Friday and 1 tablet (2.5 mg) on all other days  30 tablet  1   No current facility-administered medications for this visit.    Past Medical History  Diagnosis Date  . Lactose intolerance   . IBS (irritable bowel syndrome)   . Dyslipidemia   . 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 Kidney stone     R ureteral stone  . Coronary artery disease   . S/P hemorrhoidectomy   . Arthritis   . Carotid stenosis, bilateral PER DUPLEX  04-10-2011    RICA  40-59%/  LICA 60-79%  . Cardiomyopathy secondary     EF 25% echo 2013  . Atrial fibrillation     chronic anticoag  . Hypercholesterolemia   . Spinal stenosis   . Neuromuscular disorder     neuropathy d/t amyloidosis  . ANXIETY   . DEPRESSION   . Branch retinal vein occlusion of left eye     12/2011 event - vision improving - Following with optho and retinal  specialist for same    Past Surgical History  Procedure Laterality Date  . Hemorrhoid surgery  2002  . Carpal tunnel release  2005    RIGHT  . Cysto/ bladder bx and fulgeration  02-21-2007  . Right sural nerve bx/ right gastrocemius muscle bx  09-25-2009  . Breast biopsy  1992  . Appendectomy  1961  . Extracorporeal shock wave lithotripsy  08-10-2011    RIGHT  . Cystoscopy  09/2011  . Tonsillectomy  10-23-11    child  . Cystoscopy with ureteroscopy  10/28/2011    Procedure: CYSTOSCOPY WITH URETEROSCOPY;  Surgeon: Milford Cage, MD;  Location: [facility];  Service: Urology;  Laterality: Right;  . Eye surgery      ROS: As stated in the HPI and negative for all other systems.   PHYSICAL EXAM BP 90/50  Pulse 90  Ht 5' 4.5" (1.638 m)  Wt 137 lb (62.143 kg)  BMI 23.16 kg/m2  SpO2 97%  LMP 08/04/1991 GEN: No distress, frail appearing NECK: Jugular venous distention to the jaw at 90 degrees, waveform within normal limits, carotid upstroke brisk and symmetric, no bruits, no thyromegaly  LUNGS: Clear to auscultation bilaterally  BACK: No CVA tenderness  CHEST: Unremarkable  HEART: S1 and S2 within normal limits, no S3, no clicks, no rubs, no murmurs, irregular  ABD: Positive bowel sounds normal in frequency in pitch, no bruits, no rebound, no guarding, unable to assess midline mass or bruit with the patient seated.  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 We will check a basic metabolic profile as we have been following this almost weekly.  Of note although she has end-stage heart failure she has resisted end-of-life  discussions.  Atrial fibrillation  She was back in afib at the last visit.  She is tolerating anticoagulation.   Hypotension  She will continue with the midodrine as prescribed.    CKD  We are following this with frequent labs as above.

## 2013-04-25 NOTE — Patient Instructions (Addendum)
Please continue medications as discussed and as listed.  Have BMP today.  1 month follow up with Dr Antoine Poche

## 2013-04-27 ENCOUNTER — Encounter (HOSPITAL_BASED_OUTPATIENT_CLINIC_OR_DEPARTMENT_OTHER): Payer: Medicare Other | Attending: Internal Medicine

## 2013-04-27 DIAGNOSIS — L97909 Non-pressure chronic ulcer of unspecified part of unspecified lower leg with unspecified severity: Secondary | ICD-10-CM | POA: Insufficient documentation

## 2013-04-27 DIAGNOSIS — I87319 Chronic venous hypertension (idiopathic) with ulcer of unspecified lower extremity: Secondary | ICD-10-CM | POA: Insufficient documentation

## 2013-05-01 ENCOUNTER — Other Ambulatory Visit: Payer: Medicare Other

## 2013-05-12 ENCOUNTER — Telehealth: Payer: Self-pay | Admitting: Cardiology

## 2013-05-12 NOTE — Telephone Encounter (Signed)
Per pt - wt is up from 126 to 130 lbs.  She took zaroxolyn 2.5 mg yesterday and will take another today.  She is also taking extra potassium as well. She will with repeat lab at her next appointment unless Dr Antoine Poche wants it sooner (appt 9/30).    She has also obtained new compression stockings and has been having trouble getting them on.  She has spoken with the company and they gave her suggestions on how to put them on and are also sending her a video.    She wanted to let Dr Antoine Poche know as well she has been missing her afternoon dose of Midodrine but is going to be setting an alarm to help her remember to take it.  She will call back with any other questions or concerns.

## 2013-05-12 NOTE — Telephone Encounter (Signed)
New Problem  Pt has a question about medications and compression stocking. Request a call back.

## 2013-05-16 ENCOUNTER — Ambulatory Visit (INDEPENDENT_AMBULATORY_CARE_PROVIDER_SITE_OTHER): Payer: Medicare Other | Admitting: *Deleted

## 2013-05-16 DIAGNOSIS — Z23 Encounter for immunization: Secondary | ICD-10-CM

## 2013-05-16 DIAGNOSIS — E538 Deficiency of other specified B group vitamins: Secondary | ICD-10-CM

## 2013-05-16 DIAGNOSIS — G63 Polyneuropathy in diseases classified elsewhere: Secondary | ICD-10-CM

## 2013-05-16 MED ORDER — CYANOCOBALAMIN 1000 MCG/ML IJ SOLN
1000.0000 ug | Freq: Once | INTRAMUSCULAR | Status: AC
  Administered 2013-05-16: 1000 ug via INTRAMUSCULAR

## 2013-05-17 ENCOUNTER — Encounter: Payer: Self-pay | Admitting: Cardiology

## 2013-05-17 LAB — CBC AND DIFFERENTIAL
Platelets: 210 10*3/uL (ref 150–399)
WBC: 6.8 10^3/mL

## 2013-05-17 LAB — BASIC METABOLIC PANEL
BUN: 43 mg/dL — AB (ref 4–21)
Creatinine: 1.3 mg/dL — AB (ref 0.5–1.1)
Glucose: 102 mg/dL

## 2013-05-23 ENCOUNTER — Ambulatory Visit (INDEPENDENT_AMBULATORY_CARE_PROVIDER_SITE_OTHER): Payer: Medicare Other | Admitting: Cardiology

## 2013-05-23 ENCOUNTER — Encounter: Payer: Self-pay | Admitting: Cardiology

## 2013-05-23 ENCOUNTER — Ambulatory Visit (INDEPENDENT_AMBULATORY_CARE_PROVIDER_SITE_OTHER): Payer: Medicare Other

## 2013-05-23 VITALS — BP 92/62 | HR 93 | Ht 64.5 in | Wt 137.8 lb

## 2013-05-23 DIAGNOSIS — Z7901 Long term (current) use of anticoagulants: Secondary | ICD-10-CM

## 2013-05-23 DIAGNOSIS — I2510 Atherosclerotic heart disease of native coronary artery without angina pectoris: Secondary | ICD-10-CM

## 2013-05-23 DIAGNOSIS — I498 Other specified cardiac arrhythmias: Secondary | ICD-10-CM

## 2013-05-23 DIAGNOSIS — I5042 Chronic combined systolic (congestive) and diastolic (congestive) heart failure: Secondary | ICD-10-CM

## 2013-05-23 NOTE — Patient Instructions (Addendum)
The current medical regimen is effective;  continue present plan and medications.  Please have blood work next week  (BMP)  Follow up with Dr Antoine Poche in 1 month.

## 2013-05-23 NOTE — Progress Notes (Signed)
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  Since last saw her she has been dose adjusting her diuretics based on daily weights. We have been checking frequent blood work and adjusting her potassium as needed.  She has gained a few pounds over the past several days.  She did eat hotdogs and french fries once last week.   She denies any overt dyspnea with PND or orthopnea.  Her nephrologist did not want her to take zaroxolyn.  Instead she was told to take 60 mg extra of Demadex if her weight goes up. She's done this over the last 2 days. I did retrieve some blood work and her creatinine was stable and her potassium was replenished. She's also concerned about her lower extremity ulcers which are actually now healed.   Allergies  Allergen Reactions  . Statins Other (See Comments)    Leg cramps and restlessness    Current Outpatient Prescriptions  Medication Sig Dispense Refill  . acetaminophen (TYLENOL) 500 MG tablet Take 500 mg by mouth daily as needed for pain.       . Alpha-D-Galactosidase (BEANO PO) Take 1 tablet by mouth daily as needed (gas).      . calcitRIOL (ROCALTROL) 0.25 MCG capsule Take 0.25 mcg by mouth daily.      . cyanocobalamin (,VITAMIN B-12,) 1000 MCG/ML injection Inject 1,000 mcg into the muscle every 30 (thirty) days. Last dose 01/16/13      . cyclobenzaprine (FLEXERIL) 5 MG tablet Take 5 mg by mouth at bedtime as needed for muscle spasms.       . diflunisal (DOLOBID) 500 MG TABS Take 250 mg by mouth 2 (two) times daily.       Marland Kitchen docusate sodium (STOOL SOFTENER) 100 MG capsule Take 100 mg by mouth daily as needed for constipation.       . fluticasone (FLONASE) 50 MCG/ACT nasal spray Place 2 sprays into the nose daily.  16 g  2  . gabapentin (NEURONTIN) 100 MG capsule Take 100-200 mg by mouth. 100 mg every morning and 200 mg in the afternoon.  (sometimes takes 300 mg at once if stomach pain is bad)      . loperamide (IMODIUM) 2 MG capsule Take 2  mg by mouth 2 (two) times daily.       . metolazone (ZAROXOLYN) 2.5 MG tablet Take 2.5 mg by mouth daily as needed (for excessive weight gain to to fluid).      . midodrine (PROAMATINE) 5 MG tablet 5 mg. Take 5 mg by mouth 3 times daily.      . Multiple Vitamin (MULTIVITAMIN WITH MINERALS) TABS Take 1 tablet by mouth daily.      . potassium chloride (K-DUR) 10 MEQ tablet Take 30 mEq by mouth 2 (two) times daily.      . Probiotic Product (ALIGN PO) Take 1 capsule by mouth daily.       Marland Kitchen SIMETHICONE PO Take 1 tablet by mouth daily as needed (for gas). Simethicone Extra Strength - over the counter - pt not sure of dose      . spironolactone (ALDACTONE) 25 MG tablet Take 0.5 tablets (12.5 mg total) by mouth as directed. 12.5 mg Tuesday,thursday,saturday  15 tablet  0  . torsemide (DEMADEX) 20 MG tablet Take 60 mg by mouth 2 (two) times daily.      Marland Kitchen warfarin (COUMADIN) 2.5 MG tablet Take 1/2 tablet (1.25 mg) on Monday, Wednesday and Friday and 1 tablet (2.5 mg)  on all other days  30 tablet  1   No current facility-administered medications for this visit.    Past Medical History  Diagnosis Date  . Lactose intolerance   . IBS (irritable bowel syndrome)   . Dyslipidemia   . 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 Kidney stone     R ureteral stone  . Coronary artery disease   . S/P hemorrhoidectomy   . Arthritis   . Carotid stenosis, bilateral PER DUPLEX  04-10-2011    RICA  40-59%/  LICA 60-79%  . Cardiomyopathy secondary     EF 25% echo 2013  . Atrial fibrillation     chronic anticoag  . Hypercholesterolemia   . Spinal stenosis   . Neuromuscular disorder     neuropathy d/t amyloidosis  . ANXIETY   . DEPRESSION   . Branch retinal vein occlusion of left eye     12/2011 event - vision improving - Following with optho and retinal specialist for same    Past Surgical History  Procedure Laterality Date  .  Hemorrhoid surgery  2002  . Carpal tunnel release  2005    RIGHT  . Cysto/ bladder bx and fulgeration  02-21-2007  . Right sural nerve bx/ right gastrocemius muscle bx  09-25-2009  . Breast biopsy  1992  . Appendectomy  1961  . Extracorporeal shock wave lithotripsy  08-10-2011    RIGHT  . Cystoscopy  09/2011  . Tonsillectomy  10-23-11    child  . Cystoscopy with ureteroscopy  10/28/2011    Procedure: CYSTOSCOPY WITH URETEROSCOPY;  Surgeon: Milford Cage, MD;  Location: [facility];  Service: Urology;  Laterality: Right;  . Eye surgery      ROS: As stated in the HPI and negative for all other systems.   PHYSICAL EXAM BP 92/62  Pulse 93  Ht 5' 4.5" (1.638 m)  Wt 137 lb 12.8 oz (62.506 kg)  BMI 23.3 kg/m2  LMP 08/04/1991 GEN: No distress, frail appearing NECK: Jugular venous distention to the jaw at 90 degrees, waveform within normal limits, carotid upstroke brisk and symmetric, no bruits, no thyromegaly  LUNGS: Clear to auscultation bilaterally  BACK: No CVA tenderness  CHEST: Unremarkable  HEART: S1 and S2 within normal limits, no S3, no clicks, no rubs, no murmurs, irregular  ABD: Positive bowel sounds normal in frequency in pitch, no bruits, no rebound, no guarding, unable to assess midline mass or bruit with the patient seated.  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 However, in the past this is the one thing that has worked to treat her tenuous volume status. We will keep a close eye on this and use Zaroxolyn if necessary. I will be checking a basic metabolic profile again next week.  Atrial  fibrillation  She tolerates this rhythm She is tolerating anticoagulation.   Hypotension  She will continue with the midodrine as prescribed.    CKD  We are following this with frequent labs as above.  LEG ULCERS These are healed. She asked me to call and  talk with the wound clinic which I did today because she would really like to continue to use the boot. They suggested there is no role in this once the wound has healed.  I relayed this message back to the patient.

## 2013-05-23 NOTE — Telephone Encounter (Signed)
Addressed in office 9/30

## 2013-05-25 ENCOUNTER — Telehealth: Payer: Self-pay | Admitting: Cardiology

## 2013-05-25 ENCOUNTER — Encounter (HOSPITAL_BASED_OUTPATIENT_CLINIC_OR_DEPARTMENT_OTHER): Payer: Medicare Other | Attending: Internal Medicine

## 2013-05-25 DIAGNOSIS — I872 Venous insufficiency (chronic) (peripheral): Secondary | ICD-10-CM | POA: Insufficient documentation

## 2013-05-25 DIAGNOSIS — Z79899 Other long term (current) drug therapy: Secondary | ICD-10-CM

## 2013-05-25 DIAGNOSIS — I5022 Chronic systolic (congestive) heart failure: Secondary | ICD-10-CM

## 2013-05-25 NOTE — Telephone Encounter (Signed)
Give Zaroxolyn 2.5 mg today and tomorrow.  We need a BMET on Friday.

## 2013-05-25 NOTE — Telephone Encounter (Signed)
Pt aware of orders and will have repeat lab tomorrow

## 2013-05-25 NOTE — Telephone Encounter (Signed)
Will forward to MD for orders

## 2013-05-25 NOTE — Telephone Encounter (Signed)
Increased med did not help, 3  Days, wt increased 3 lbs since that time, what to do? Call  [phone] ok to leave a message

## 2013-05-26 ENCOUNTER — Other Ambulatory Visit (INDEPENDENT_AMBULATORY_CARE_PROVIDER_SITE_OTHER): Payer: Medicare Other

## 2013-05-26 ENCOUNTER — Encounter: Payer: Self-pay | Admitting: Internal Medicine

## 2013-05-26 DIAGNOSIS — Z79899 Other long term (current) drug therapy: Secondary | ICD-10-CM

## 2013-05-26 DIAGNOSIS — I5022 Chronic systolic (congestive) heart failure: Secondary | ICD-10-CM

## 2013-05-26 LAB — BASIC METABOLIC PANEL
BUN: 49 mg/dL — ABNORMAL HIGH (ref 6–23)
Calcium: 9.2 mg/dL (ref 8.4–10.5)
Creatinine, Ser: 1.6 mg/dL — ABNORMAL HIGH (ref 0.4–1.2)
GFR: 33.04 mL/min — ABNORMAL LOW (ref >60.00)
Glucose, Bld: 86 mg/dL (ref 70–99)
Potassium: 3.3 mEq/L — ABNORMAL LOW (ref 3.5–5.1)

## 2013-05-29 ENCOUNTER — Ambulatory Visit (INDEPENDENT_AMBULATORY_CARE_PROVIDER_SITE_OTHER)
Admission: RE | Admit: 2013-05-29 | Discharge: 2013-05-29 | Disposition: A | Discharge status: Home / Self Care | Payer: Medicare Other | Source: Ambulatory Visit | Attending: Family Medicine | Admitting: Family Medicine

## 2013-05-29 ENCOUNTER — Ambulatory Visit (INDEPENDENT_AMBULATORY_CARE_PROVIDER_SITE_OTHER): Payer: Medicare Other | Admitting: Family Medicine

## 2013-05-29 ENCOUNTER — Encounter: Payer: Self-pay | Admitting: Family Medicine

## 2013-05-29 ENCOUNTER — Telehealth: Payer: Self-pay | Admitting: *Deleted

## 2013-05-29 VITALS — BP 106/72 | HR 81

## 2013-05-29 DIAGNOSIS — M25571 Pain in right ankle and joints of right foot: Secondary | ICD-10-CM

## 2013-05-29 DIAGNOSIS — M25579 Pain in unspecified ankle and joints of unspecified foot: Secondary | ICD-10-CM

## 2013-05-29 NOTE — Telephone Encounter (Signed)
Pt came in to see Dr. Katrinka Blazing for an injured right ankle, pt is requesting a recliner with a boost, small motorized wheel chair for the home, & a light weight portable wheel chair to use outside of the home. Please advise.

## 2013-05-29 NOTE — Telephone Encounter (Signed)
Does Dr Katrinka Blazing not order DME for his patients? I am ok with him directly ordering whatever equipment he feels is most appropriate -   otherwise, I will order HH to assess her and make their recommended for their specific equipment (motorized [facility] evaluations are very time consuming and require very specific documentation that is best coordinated by the [facility] experts in my experience)  Thanks for your help with our patients!

## 2013-05-29 NOTE — Progress Notes (Signed)
CC: Right ankle pain after fall  HPI: Patient is a very pleasant 72 year old female with a past medical history significant for peripheral neuropathy as well as a lower extremity wound that is 7 months in age being treated for it the wound clinic coming in with an acute fall. Patient stable is walking with the aid of her walker and did trip. Patient did roll her right ankle and had some pain immediately. Patient finds it very difficult though because she does not have significant amount of feeling there. Patient states this happened approximately 4 hours ago. Since that time as long as she is nonweightbearing she seems to be doing well. Patient is able to ambulate if necessary. Patient does have some discomfort that she points over the anterior aspect of the ankle. Patient denies any other new symptoms but finds it difficult secondary to her peripheral neuropathy. Patient puts the pain when it comes at about 5/10 and at this time is 2/10 while she is sitting in the chair. Patient is wearing an Radio broadcast assistant and would likely not to remove it. Patient is also on Coumadin the patient denies that she hit her head.   Past medical, surgical, family and social history reviewed. Medications reviewed all in the electronic medical record.   Review of Systems: No headache, visual changes, nausea, vomiting, diarrhea, constipation, dizziness, abdominal pain, skin rash, fevers, chills, night sweats, weight loss, swollen lymph nodes, body aches, joint swelling, muscle aches, chest pain, shortness of breath, mood changes.   Objective:    Blood pressure 106/72, pulse 81, last menstrual period 08/04/1991, SpO2 96.00%.   General: No apparent distress alert and oriented x3 mood and affect normal, dressed appropriately. Patient is extremely frail HEENT: Pupils equal, extraocular movements intact Respiratory: Patient's speak in full sentences and does not appear short of breath Cardiovascular: No lower extremity edema, non  tender, no erythema Skin: Unna boot in place bilaterally of the lower extremities.  Patient does have a bruise on her chin Abdomen: Soft nontender Neuro: Cranial nerves II through XII are intact, patient does have mild sensation of the lower extremities bilaterally to touch  Lymph: No lymphadenopathy of posterior or anterior cervical chain or axillae bilaterally.  Gait normal with good balance and coordination.  MSK: Patient is able to stand but needs help secondary to poor balance more than pain of the ankle the symmetric strength and tone of shoulders, elbows, wrist, hip, and ankles bilaterally.  Ankle: Right No visible erythema or swelling seen secondary to the [employer]. I did suggest that needed to cut this off to be able to see her skin which patient was adamant that she did not want to do that today. Patient does have flexion and dorsiflexion without any pain Strength is 3/5 in all directions. Very close to the same on the contralateral side Stable lateral and medial ligaments; squeeze test and kleiger test unremarkable; Talar dome nontender; No pain at base of 5th MT; No tenderness over cuboid;  tenderness over N spot or navicular prominence No tenderness on posterior aspects of lateral and medial malleolus patient does have an area that seems to be padding over the wound she has on the lateral aspect of her lower right leg Negative tarsal tunnel tinel's Able to walk 4 steps but secondary to her peripheral neuropathy this is extremely difficult. Patient states that she does have some mild pain but does not the reason she feels unsteady.  Impression and Recommendations:     This case required  medical decision making of moderate complexity.

## 2013-05-29 NOTE — Assessment & Plan Note (Signed)
Discussed with patient at great length that I would really need to remove the [employer]. Patient did not feel comfortable with her having 7 months of changes with wound care. I am unable to see if there is any bruising or any type of swelling of the skin. Patient does have some mild sensation and she states that this seems to be at her borderline. Patient does have some mild tenderness over the navicular bone but  on x-ray today were reviewed by me there is no fracture. Patient does have significant osteoporosis. Patient was given a new postop boot with better traction. Patient will return tomorrow at some points after she has discussed this with her wound care. She will come in and we will cut off the Unna boot and look at the skin in and we will get her to the wound Center to have a new in the boot applicator. Discuss Tylenol for any pain Discuss if she has any worsening of the pain to go to the emergency department immediately in please to allow them to remove the Unna boot at that time.

## 2013-05-29 NOTE — Patient Instructions (Signed)
Very nice to meet you I am sorry that you fell.  The xrays look normal  I would like to see you again tomorrow so I can cut off the [employer] and look to make sure there is no change in your skin.  I have a 130pm and a 3pm appointment available.  Tylenol 650 mg three times daily for pain Ice pack for 10 minutes 1 time daily can be good as well. Keep it above the unna boot.  See you tomorrow.

## 2013-05-30 ENCOUNTER — Ambulatory Visit (INDEPENDENT_AMBULATORY_CARE_PROVIDER_SITE_OTHER): Payer: Medicare Other | Admitting: Family Medicine

## 2013-05-30 ENCOUNTER — Encounter: Payer: Self-pay | Admitting: Family Medicine

## 2013-05-30 ENCOUNTER — Other Ambulatory Visit: Payer: Self-pay | Admitting: *Deleted

## 2013-05-30 VITALS — BP 84/56 | HR 85

## 2013-05-30 DIAGNOSIS — M25579 Pain in unspecified ankle and joints of unspecified foot: Secondary | ICD-10-CM

## 2013-05-30 DIAGNOSIS — I472 Ventricular tachycardia: Secondary | ICD-10-CM

## 2013-05-30 DIAGNOSIS — M25571 Pain in right ankle and joints of right foot: Secondary | ICD-10-CM

## 2013-05-30 MED ORDER — SPIRONOLACTONE 25 MG PO TABS: 12.5000 mg | ORAL_TABLET | ORAL | Status: DC

## 2013-05-30 NOTE — Progress Notes (Signed)
  Subjective:    CC: Right ankle pain  HPI: Patient is here following up after one day of right ankle pain. Please see previous note about her fall. Yesterday patient did not want to remove the Unna boots for allowing me to visualize patient's ankle. Patient though does have a appointment at the wound care center today. Patient is here to remove the Unna boots so I can visualize the skin. Please see previous note for further information. Patient states that the pain did resolve a little bit last night and did respond to Tylenol. Patient states that it has now localized a little bit more towards the lateral aspect of the ankle. Patient has been able to walk.  Past medical history, Surgical history, Family history not pertinant except as noted below, Social history, Allergies, and medications have been entered into the medical record, reviewed, and no changes needed.   Review of Systems: No fevers, chills, night sweats, weight loss, chest pain, or shortness of breath.   Objective:   Last menstrual period 08/04/1991.  General: No apparent distress alert and oriented x3 mood and affect normal, dressed appropriately. Patient is extremely frail  HEENT: Pupils equal, extraocular movements intact  Respiratory: Patient's speak in full sentences and does not appear short of breath  Cardiovascular: No lower extremity edema, non tender, no erythema  Skin: Unna boot in place bilaterally of the lower extremities. Patient does have a bruise on her chin  Abdomen: Soft nontender  Neuro: Cranial nerves II through XII are intact, patient does have mild sensation of the lower extremities bilaterally to touch  Lymph: No lymphadenopathy of posterior or anterior cervical chain or axillae bilaterally.  Gait normal with good balance and coordination.  MSK: Patient is able to stand but needs help secondary to poor balance more than pain of the ankle the symmetric strength and tone of shoulders, elbows, wrist, hip, and  ankles bilaterally.  Ankle: Right  No visible erythema or swelling seen secondary to the [employer]. I did suggest that needed to cut this off to be able to see her skin which patient was adamant that she did not want to do that today.  Patient does have flexion and dorsiflexion without any pain  Strength is 3/5 in all directions. Very close to the same on the contralateral side  Stable lateral and medial ligaments; squeeze test and kleiger test unremarkable;  Talar dome nontender;  No pain at base of 5th MT; No tenderness over cuboid;  tenderness over N spot or navicular prominence  No tenderness on posterior aspects of lateral and medial malleolus patient does have an area that seems to be padding over the wound she has on the lateral aspect of her lower right leg  Negative tarsal tunnel tinel's  Able to walk 4 steps but secondary to her peripheral neuropathy this is extremely difficult. Patient states that she does have some mild pain but does not the reason she feels unsteady. Skin exam of the right ankle shows patient does have some mild bruising of the lateral aspect of the ankle it does go down to the dorsal aspect of the foot. Patient does have +2 pitting edema of the foot which patient states his relatively normal for her. Patient does have some discomfort when palpating over the ATFL.  Impression and Recommendations:

## 2013-05-30 NOTE — Assessment & Plan Note (Signed)
Patient does have any ankle sprain and grade 1 to grade 2.   Discuss potentially having it wrapped somewhat a little looser around the foot to allow for swelling to resolve. Patient's peripheral neuropathy a tight compression would be difficult to know if it is causing her any pain. Gave her range of motion exercises that can be beneficial. Discussed icing for 10 minutes but not with direct contact secondary to peripheral neuropathy. Patient is going to formal physical therapy soon for conditioning and I do think that they can work on her ankle as well. Have patient come back in 2 weeks for further evaluation.

## 2013-05-30 NOTE — Telephone Encounter (Signed)
done

## 2013-05-30 NOTE — Patient Instructions (Signed)
Good to see you Thank you for letting me see the skin. It would be great if the wound clinic would not wrap your foot but this could cause more swelling so let them decide what is best.  Ice wrapped in towel, not directly on skin for only 10 minutes would be fine.  Tylenol 650 three times a day and tramadol as needed at night.  Do range of motion exercises daily Come back and see me again in 2 weeks.

## 2013-05-30 NOTE — Telephone Encounter (Signed)
Dr. Katrinka Blazing states that he orders DME for sports through an outside source. Please enter an order for [facility] to assess the pt. Thank You!

## 2013-05-31 ENCOUNTER — Other Ambulatory Visit: Payer: Medicare Other

## 2013-06-01 ENCOUNTER — Telehealth: Payer: Self-pay | Admitting: *Deleted

## 2013-06-01 NOTE — Telephone Encounter (Signed)
Pt called requesting PT referral, states the referral written 03/2013 is too old.  Appointment needed to have been made within 30 days.  [facility] [phone] fax; [phone] phone.  Please advise

## 2013-06-02 ENCOUNTER — Other Ambulatory Visit: Payer: Self-pay | Admitting: Internal Medicine

## 2013-06-02 ENCOUNTER — Telehealth: Payer: Self-pay | Admitting: Cardiology

## 2013-06-02 ENCOUNTER — Other Ambulatory Visit: Payer: Self-pay | Admitting: Nurse Practitioner

## 2013-06-02 DIAGNOSIS — I428 Other cardiomyopathies: Secondary | ICD-10-CM

## 2013-06-02 DIAGNOSIS — M25571 Pain in right ankle and joints of right foot: Secondary | ICD-10-CM

## 2013-06-02 NOTE — Telephone Encounter (Signed)
Referral placed.

## 2013-06-02 NOTE — Telephone Encounter (Signed)
New Problem   Pt's spraned  Right ankle more swelling w/less compression some wieght gain over night 2lb gained.   Pt would like to know if she needs to take a buster?

## 2013-06-02 NOTE — Telephone Encounter (Signed)
Discussed with Dr. Antoine Poche who advised patient take Zaroxolyn 2.5 mg today and tomorrow and to take an extra 30 mEq of potassium along with the Zaroxolyn and to get BMET early next week.  I advised patient of instructions and she verbalized understanding.  Patient is scheduled for [facility] Monday 10/13.  Patient states if she does not get here Monday she will come in Tuesday.  I advised patient to call us if symptoms do not improve and advised her on low sodium diet.  Patient states she is doing well following a low sodium diet and thanked me for the reminder.  Patient verbalized understanding of all instructions.

## 2013-06-02 NOTE — Telephone Encounter (Signed)
Spoke with patient who c/o increased weight gain since Monday.  Patient states Monday weight was 122.2 lb; weight on Thursday 124.8 lb and weight today 127.0 lb.  Patient denies SOB, chest discomfort.  Patient states she noticed yesterday that she was not urinating as frequently as usual.  Patient is currently being treated by PCP for Right ankle sprain.  Patient states she has a wrap on that foot and she can only see her toes but she does not feel that they are more swollen today.  Patient denies edema in left lower extremity.  States her abdomen does feel more swollen than normal.  Patient had not taken daily dose of Torsemide (60 mg) yet.  I advised patient to take her Torsemide and that I will discuss her complaint with Dr. Antoine Poche as he will be in the office today.  Patient verbalized understanding and agreement with plan and gratitude.

## 2013-06-03 ENCOUNTER — Emergency Department (HOSPITAL_COMMUNITY): Payer: Medicare Other

## 2013-06-03 ENCOUNTER — Encounter (HOSPITAL_COMMUNITY): Payer: Self-pay | Admitting: Emergency Medicine

## 2013-06-03 ENCOUNTER — Inpatient Hospital Stay (HOSPITAL_COMMUNITY)
Admission: EM | Admit: 2013-06-03 | Discharge: 2013-06-06 | DRG: 292 | Disposition: A | Discharge status: Home / Self Care | Payer: Medicare Other | Attending: Internal Medicine | Admitting: Internal Medicine

## 2013-06-03 DIAGNOSIS — R296 Repeated falls: Secondary | ICD-10-CM

## 2013-06-03 DIAGNOSIS — M47814 Spondylosis without myelopathy or radiculopathy, thoracic region: Secondary | ICD-10-CM | POA: Diagnosis present

## 2013-06-03 DIAGNOSIS — I5043 Acute on chronic combined systolic (congestive) and diastolic (congestive) heart failure: Principal | ICD-10-CM | POA: Diagnosis present

## 2013-06-03 DIAGNOSIS — N179 Acute kidney failure, unspecified: Secondary | ICD-10-CM | POA: Diagnosis present

## 2013-06-03 DIAGNOSIS — I6529 Occlusion and stenosis of unspecified carotid artery: Secondary | ICD-10-CM | POA: Diagnosis present

## 2013-06-03 DIAGNOSIS — I480 Paroxysmal atrial fibrillation: Secondary | ICD-10-CM

## 2013-06-03 DIAGNOSIS — E852 Heredofamilial amyloidosis, unspecified: Secondary | ICD-10-CM | POA: Diagnosis present

## 2013-06-03 DIAGNOSIS — I428 Other cardiomyopathies: Secondary | ICD-10-CM | POA: Diagnosis present

## 2013-06-03 DIAGNOSIS — I129 Hypertensive chronic kidney disease with stage 1 through stage 4 chronic kidney disease, or unspecified chronic kidney disease: Secondary | ICD-10-CM | POA: Diagnosis present

## 2013-06-03 DIAGNOSIS — E876 Hypokalemia: Secondary | ICD-10-CM

## 2013-06-03 DIAGNOSIS — W19XXXA Unspecified fall, initial encounter: Secondary | ICD-10-CM | POA: Diagnosis present

## 2013-06-03 DIAGNOSIS — F411 Generalized anxiety disorder: Secondary | ICD-10-CM | POA: Diagnosis present

## 2013-06-03 DIAGNOSIS — E871 Hypo-osmolality and hyponatremia: Secondary | ICD-10-CM | POA: Diagnosis present

## 2013-06-03 DIAGNOSIS — F3289 Other specified depressive episodes: Secondary | ICD-10-CM | POA: Diagnosis present

## 2013-06-03 DIAGNOSIS — E7800 Pure hypercholesterolemia, unspecified: Secondary | ICD-10-CM | POA: Diagnosis present

## 2013-06-03 DIAGNOSIS — I509 Heart failure, unspecified: Secondary | ICD-10-CM

## 2013-06-03 DIAGNOSIS — Z7901 Long term (current) use of anticoagulants: Secondary | ICD-10-CM

## 2013-06-03 DIAGNOSIS — F329 Major depressive disorder, single episode, unspecified: Secondary | ICD-10-CM | POA: Diagnosis present

## 2013-06-03 DIAGNOSIS — E859 Amyloidosis, unspecified: Secondary | ICD-10-CM | POA: Diagnosis present

## 2013-06-03 DIAGNOSIS — E785 Hyperlipidemia, unspecified: Secondary | ICD-10-CM | POA: Diagnosis present

## 2013-06-03 DIAGNOSIS — G609 Hereditary and idiopathic neuropathy, unspecified: Secondary | ICD-10-CM | POA: Diagnosis present

## 2013-06-03 DIAGNOSIS — S93409A Sprain of unspecified ligament of unspecified ankle, initial encounter: Secondary | ICD-10-CM | POA: Diagnosis present

## 2013-06-03 DIAGNOSIS — I658 Occlusion and stenosis of other precerebral arteries: Secondary | ICD-10-CM | POA: Diagnosis present

## 2013-06-03 DIAGNOSIS — I2510 Atherosclerotic heart disease of native coronary artery without angina pectoris: Secondary | ICD-10-CM | POA: Diagnosis present

## 2013-06-03 DIAGNOSIS — I5042 Chronic combined systolic (congestive) and diastolic (congestive) heart failure: Secondary | ICD-10-CM

## 2013-06-03 DIAGNOSIS — I4891 Unspecified atrial fibrillation: Secondary | ICD-10-CM

## 2013-06-03 DIAGNOSIS — Z8249 Family history of ischemic heart disease and other diseases of the circulatory system: Secondary | ICD-10-CM

## 2013-06-03 DIAGNOSIS — K589 Irritable bowel syndrome without diarrhea: Secondary | ICD-10-CM | POA: Diagnosis present

## 2013-06-03 DIAGNOSIS — I4510 Unspecified right bundle-branch block: Secondary | ICD-10-CM | POA: Diagnosis present

## 2013-06-03 DIAGNOSIS — N19 Unspecified kidney failure: Secondary | ICD-10-CM

## 2013-06-03 DIAGNOSIS — E875 Hyperkalemia: Secondary | ICD-10-CM | POA: Diagnosis present

## 2013-06-03 DIAGNOSIS — Z87891 Personal history of nicotine dependence: Secondary | ICD-10-CM

## 2013-06-03 DIAGNOSIS — N183 Chronic kidney disease, stage 3 unspecified: Secondary | ICD-10-CM | POA: Diagnosis present

## 2013-06-03 DIAGNOSIS — R188 Other ascites: Secondary | ICD-10-CM | POA: Diagnosis present

## 2013-06-03 DIAGNOSIS — I959 Hypotension, unspecified: Secondary | ICD-10-CM | POA: Diagnosis present

## 2013-06-03 HISTORY — DX: Organ-limited amyloidosis: E85.4

## 2013-06-03 HISTORY — DX: Hyperkalemia: E87.5

## 2013-06-03 HISTORY — DX: Hypokalemia: E87.6

## 2013-06-03 HISTORY — DX: Ventricular tachycardia, unspecified: I47.20

## 2013-06-03 HISTORY — DX: Hypotension, unspecified: I95.9

## 2013-06-03 HISTORY — DX: Ventricular tachycardia: I47.2

## 2013-06-03 HISTORY — DX: Unspecified right bundle-branch block: I45.10

## 2013-06-03 HISTORY — DX: Paroxysmal atrial fibrillation: I48.0

## 2013-06-03 HISTORY — DX: Unspecified combined systolic (congestive) and diastolic (congestive) heart failure: I50.40

## 2013-06-03 HISTORY — DX: Cardiomyopathy in diseases classified elsewhere: I43

## 2013-06-03 LAB — POCT I-STAT, CHEM 8
BUN: 71 mg/dL — ABNORMAL HIGH (ref 6–23)
Chloride: 96 mEq/L (ref 96–112)
Creatinine, Ser: 2.2 mg/dL — ABNORMAL HIGH (ref 0.50–1.10)
Glucose, Bld: 87 mg/dL (ref 70–99)
HCT: 46 % (ref 36.0–46.0)
Hemoglobin: 15.6 g/dL — ABNORMAL HIGH (ref 12.0–15.0)
Potassium: 6.4 mEq/L (ref 3.5–5.1)
Sodium: 129 mEq/L — ABNORMAL LOW (ref 135–145)

## 2013-06-03 LAB — URINALYSIS, ROUTINE W REFLEX MICROSCOPIC
Bilirubin Urine: NEGATIVE
Hgb urine dipstick: NEGATIVE
Ketones, ur: NEGATIVE mg/dL
Specific Gravity, Urine: 1.011 (ref 1.005–1.030)
Urobilinogen, UA: 0.2 mg/dL (ref 0.0–1.0)

## 2013-06-03 LAB — CBC WITH DIFFERENTIAL/PLATELET
Basophils Relative: 0 % (ref 0–1)
HCT: 41.5 % (ref 36.0–46.0)
Hemoglobin: 13.9 g/dL (ref 12.0–15.0)
Lymphs Abs: 1.4 10*3/uL (ref 0.7–4.0)
MCH: 31.4 pg (ref 26.0–34.0)
MCHC: 33.5 g/dL (ref 30.0–36.0)
Monocytes Absolute: 0.8 10*3/uL (ref 0.1–1.0)
Monocytes Relative: 7 % (ref 3–12)
Neutro Abs: 8.5 10*3/uL — ABNORMAL HIGH (ref 1.7–7.7)
RBC: 4.43 MIL/uL (ref 3.87–5.11)
WBC: 10.7 10*3/uL — ABNORMAL HIGH (ref 4.0–10.5)

## 2013-06-03 LAB — PRO B NATRIURETIC PEPTIDE: Pro B Natriuretic peptide (BNP): 46285 pg/mL — ABNORMAL HIGH (ref 0–125)

## 2013-06-03 LAB — PROTIME-INR
INR: 4.81 — ABNORMAL HIGH (ref 0.00–1.49)
Prothrombin Time: 43.1 seconds — ABNORMAL HIGH (ref 11.6–15.2)

## 2013-06-03 LAB — POTASSIUM: Potassium: 6.2 mEq/L — ABNORMAL HIGH (ref 3.5–5.1)

## 2013-06-03 MED ORDER — ALBUTEROL (5 MG/ML) CONTINUOUS INHALATION SOLN
10.0000 mg/h | INHALATION_SOLUTION | Freq: Once | RESPIRATORY_TRACT | Status: AC
  Administered 2013-06-03: 10 mg/h via RESPIRATORY_TRACT
  Filled 2013-06-03: qty 20

## 2013-06-03 MED ORDER — ACETAMINOPHEN 325 MG PO TABS
650.0000 mg | ORAL_TABLET | Freq: Four times a day (QID) | ORAL | Status: DC | PRN
  Administered 2013-06-03: 650 mg via ORAL
  Filled 2013-06-03: qty 2

## 2013-06-03 MED ORDER — SODIUM CHLORIDE 0.9 % IV BOLUS (SEPSIS)
250.0000 mL | Freq: Once | INTRAVENOUS | Status: AC
  Administered 2013-06-03: 250 mL via INTRAVENOUS

## 2013-06-03 MED ORDER — SODIUM CHLORIDE 0.9 % IV SOLN
1.0000 g | Freq: Once | INTRAVENOUS | Status: AC
  Administered 2013-06-03: 1 g via INTRAVENOUS
  Filled 2013-06-03: qty 10

## 2013-06-03 MED ORDER — SODIUM CHLORIDE 0.9 % IV SOLN: Freq: Once | INTRAVENOUS | Status: DC

## 2013-06-03 MED ORDER — SODIUM POLYSTYRENE SULFONATE 15 GM/60ML PO SUSP
30.0000 g | Freq: Once | ORAL | Status: AC
  Administered 2013-06-03: 30 g via ORAL
  Filled 2013-06-03: qty 120

## 2013-06-03 NOTE — ED Provider Notes (Signed)
Miranda [name] S 8:00 PM patient discussed signout. Patient with history of CHF presenting after a fall. Family reports patient has had several small recent falls. Some increased weakness. Labs pending.   8:30 PM patient with elevated potassium 6.4. EKG without any peak T waves. There is irregular waveform but this appears similar to previous in June.  Patient also with elevated BUN and creatinine. Signs for possible early renal failure versus dehydration. Patient discussed with Dr. Blinda Leatherwood.  EKG was reviewed. We will give calcium gluconate, albuterol breathing treatment and Kayexalate  Spoke with hospitalist. They will see patient for admission.  Hospital assessing patient and is a little concerned. They would like cardiology consult.  Spoke with on-call [facility] cardiologist. They will see patient and admit the patient. Would like fluid stopped at this point and a recheck of potassium.  Angus Seller, PA-C 06/03/13 2307

## 2013-06-03 NOTE — ED Notes (Signed)
EMS called to Pt home after fall in [place]. Pt lives alone and has a [employer] manager which called EMS. Pt reported pain to Lt hip with palpation. Pt has a Hx of neuropathy to BLE. Pt is able to recall the entire fall . Pt reports pain to back of head.

## 2013-06-03 NOTE — ED Notes (Signed)
Potassium results called to PA [facility]

## 2013-06-03 NOTE — H&P (Signed)
Physician History and Physical    Miranda [name] MRN: [id number] DOB/AGE: [birth year] 73 y.o. 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 Patient recently had stopped Metolazone by nephrology and therefore had increased weight of 5 LBs. She called Dr. Antoine Poche and Metolazone restarted two days ago. However she said her urine output stayed low and today while she was walking, her legs gave out with recently sprained ankle and she fell on her back. She states she was conscious the whole time and did not pass out. She denied any chest pain or SOB, but did endorse increased LE edema and orthopnea. No apparent injury after the fall. Imaging studies including a CT head was negative.   In ER her K was found to be above 6 and creatinine elevated 2.2, ER gave 300 ml NS due to concern of AKI. She also received Calcium, Kayexalate, and Albuterol. But K remained high at 6.4.   Review of systems: A review of 10 organ systems was done and is negative except as stated above in HPI  Past Medical History  Diagnosis Date  . Lactose intolerance   . IBS (irritable bowel syndrome)   . Dyslipidemia   . 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 Kidney stone     R ureteral stone  . Coronary artery disease   . S/P hemorrhoidectomy   . Arthritis   . Carotid stenosis, bilateral PER DUPLEX  04-10-2011    RICA  40-59%/  LICA 60-79%  . Cardiomyopathy secondary     EF 25% echo 2013  . Atrial fibrillation     chronic anticoag  . Hypercholesterolemia   . Spinal stenosis   . Neuromuscular disorder     neuropathy d/t amyloidosis  . ANXIETY   . DEPRESSION   . Branch retinal vein occlusion of left eye     12/2011 event - vision  improving - Following with optho and retinal specialist for same   Past Surgical History  Procedure Laterality Date  . Hemorrhoid surgery  2002  . Carpal tunnel release  2005    RIGHT  . Cysto/ bladder bx and fulgeration  02-21-2007  . Right sural nerve bx/ right gastrocemius muscle bx  09-25-2009  . Breast biopsy  1992  . Appendectomy  1961  . Extracorporeal shock wave lithotripsy  08-10-2011    RIGHT  . Cystoscopy  09/2011  . Tonsillectomy  10-23-11    child  . Cystoscopy with ureteroscopy  10/28/2011    Procedure: CYSTOSCOPY WITH URETEROSCOPY;  Surgeon: Milford Cage, MD;  Location: [facility];  Service: Urology;  Laterality: Right;  . Eye surgery     History   Social History  . Marital Status: Divorced    Spouse Name: N/A    Number of Children: N/A  . Years of Education: N/A   Occupational History  . Not on file.   Social History Main Topics  . Smoking status: Former Smoker -- 20 years    Types: Cigarettes    Quit date: 10/28/1990  . Smokeless tobacco: Never Used  . Alcohol Use: No  . Drug Use: No  . Sexual Activity: Not on file  Other Topics Concern  . Not on file   Social History Narrative   The patient is retired.  She is divorced.  Lives alone. She has 2 children, twins.  She smoked 3 packs of cigarettes a day for 20 years but quit > 20 years ago.  She occasionally drinks wine.             Family History  Problem Relation Age of Onset  . Heart disease Mother     Heart failure at a later age  . Arthritis Mother   . Arthritis Father   . COPD Other   . Heart disease      Heart failure at a later age     Allergies  Allergen Reactions  . Statins Other (See Comments)    Leg cramps and restlessness    No current facility-administered medications on file prior to encounter.   Current Outpatient Prescriptions on File Prior to Encounter  Medication Sig Dispense Refill  . acetaminophen (TYLENOL) 500 MG tablet Take 500 mg by mouth daily as needed for  pain.       . Alpha-D-Galactosidase (BEANO PO) Take 1 tablet by mouth daily as needed (gas).      . calcitRIOL (ROCALTROL) 0.25 MCG capsule Take 0.25 mcg by mouth daily.      . cyanocobalamin (,VITAMIN B-12,) 1000 MCG/ML injection Inject 1,000 mcg into the muscle every 30 (thirty) days. Last dose 01/16/13      . cyclobenzaprine (FLEXERIL) 5 MG tablet Take 5 mg by mouth at bedtime as needed for muscle spasms.       . diflunisal (DOLOBID) 500 MG TABS Take 250 mg by mouth 2 (two) times daily.       Marland Kitchen docusate sodium (STOOL SOFTENER) 100 MG capsule Take 100 mg by mouth daily as needed for constipation.       . fluticasone (FLONASE) 50 MCG/ACT nasal spray Place 2 sprays into the nose daily.  16 g  2  . gabapentin (NEURONTIN) 100 MG capsule Take 100-200 mg by mouth. 100 mg every morning and 200 mg in the afternoon.  (sometimes takes 300 mg at once if stomach pain is bad)      . loperamide (IMODIUM) 2 MG capsule Take 2 mg by mouth 2 (two) times daily.       . metolazone (ZAROXOLYN) 2.5 MG tablet Take 2.5 mg by mouth daily as needed (for excessive weight gain to to fluid).      . midodrine (PROAMATINE) 5 MG tablet 5 mg. Take 5 mg by mouth 3 times daily.      . Multiple Vitamin (MULTIVITAMIN WITH MINERALS) TABS Take 1 tablet by mouth daily.      . potassium chloride (K-DUR) 10 MEQ tablet Take 30 mEq by mouth 2 (two) times daily.      . Probiotic Product (ALIGN PO) Take 1 capsule by mouth daily.       Marland Kitchen SIMETHICONE PO Take 1 tablet by mouth daily as needed (for gas). Simethicone Extra Strength - over the counter - pt not sure of dose      . spironolactone (ALDACTONE) 25 MG tablet Take 0.5 tablets (12.5 mg total) by mouth as directed. 12.5 mg Tuesday,thursday,saturday  15 tablet  0  . torsemide (DEMADEX) 20 MG tablet Take 60 mg by mouth 2 (two) times daily.        Physical Exam: Blood pressure 98/64, pulse 76, temperature 97.5 F (36.4 C), temperature source Oral, resp. rate 14, last menstrual  period  08/04/1991, SpO2 93.00%.; There is no weight on file to calculate BMI. Temp:  [97.5 F (36.4 C)] 97.5 F (36.4 C) (10/11 1850) Pulse Rate:  [76] 76 (10/11 1850) Resp:  [14-25] 14 (10/11 2227) BP: (92-139)/(64-99) 98/64 mmHg (10/11 2227) SpO2:  [91 %-98 %] 93 % (10/11 2227)  No intake or output data in the 24 hours ending 06/03/13 2304 General: NAD Heent: MMM Neck: 10 cm JVP CV: Nondisplaced PMI.  Irregularly irregular, nl S1/S2, positive for S3/S4, no murmur. No carotid bruit   Lungs: Clear to auscultation bilaterally with normal respiratory effort Abdomen: Soft, nontender, distended and positive for shifting dullness Extremities: No clubbing or cyanosis.  Normal pedal pulses. 2+ LE edema Skin: Intact without lesions or rashes  Neurologic: Alert and oriented x 3, grossly nonfocal  Psych: Normal mood and affect    Labs:  Recent Labs  06/03/13 2017  TROPONINI <0.30   Lab Results  Component Value Date   WBC 10.7* 06/03/2013   HGB 15.6* 06/03/2013   HCT 46.0 06/03/2013   MCV 93.7 06/03/2013   PLT 197 06/03/2013    Recent Labs Lab 06/03/13 2027  NA 129*  K 6.4*  CL 96  BUN 71*  CREATININE 2.20*  GLUCOSE 87   Lab Results  Component Value Date   CHOL 174 06/08/2012   HDL 59.80 06/08/2012   LDLCALC 99 06/08/2012   TRIG 78.0 06/08/2012       EKG:  Afib with RBBB, no sig changes compared to 01/2013.  Radiology:  Dg Chest 2 View  06/03/2013   CLINICAL DATA:  Larey Seat.  EXAM: CHEST  2 VIEW  COMPARISON:  09/14/2012.  FINDINGS: With stable enlargement of the cardiac silhouette. Increased bilateral pleural fluid. No fracture or pneumothorax seen. Thoracic spine degenerative changes.  IMPRESSION: Stable cardiomegaly with increased bilateral pleural fluid.   Electronically Signed   By: Gordan Payment M.D.   On: 06/03/2013 20:07   Dg Hip Complete Left  06/03/2013   CLINICAL DATA:  Left hip pain after fall.  EXAM: LEFT HIP - COMPLETE 2+ VIEW  COMPARISON:  None.  FINDINGS: There  is no evidence of hip fracture or dislocation. Severe narrowing of the left hip joint is noted with sclerosis consistent with degenerative joint.  IMPRESSION: No fracture or dislocation is noted. 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   Electronically Signed   By: Roque Lias M.D.   On: 06/03/2013 20:09   Ct Head Wo Contrast  06/03/2013   CLINICAL DATA:  Larey Seat and hit the left side of her head. Taking Coumadin.  EXAM: CT HEAD WITHOUT CONTRAST  TECHNIQUE: Contiguous axial images were obtained from the base of the skull through the vertex without intravenous contrast.  COMPARISON:  09/14/2012.  FINDINGS: Stable mildly enlarged ventricles and subarachnoid spaces. No skull fracture, intracranial hemorrhage or paranasal sinus air-fluid levels. Small left lateral scalp hematoma.  IMPRESSION: No skull fracture or intracranial hemorrhage. Stable mild atrophy.   Electronically Signed   By: Gordan Payment M.D.   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 Acute on chronic decompensated HF in the setting of end stage HF 2/2 Amyloidosis.  2. Hyperkalemia 3. Mechanical fall, rule out arrhythmia/syncope component, without evidence of injury 4. Coagulopathy 2/2 Warfain 5. A fib with RVR 6. Ascites  2/2 #1 7. AKI 2/2 #1 8. Hyponatremia 2/2 #1.   PLAN:  1. Close monitor in Stepdown, cautious for ventricular arrhythmia, FULL code after discussed with patient and son. 2. IV diuresis with Lasix IV 80 mg now and additional doses PRN in addition to home Metolazone and watch BP and UOP. Home PO diuretics less effective likely due to gut edema at the stage. 3. Recheck K after Lasix, repeat Calium, kayexalate if needed. 4. Amiodarone gtt for Afib w RVR 5. Check Mg/Phos, Calcium 6. Check VBG w Lactate, at this point, she is warm.  7. Start  Dobutamine low dose if signs of shock or persistent oligo uria. 8. Echocardiogram 9. Hold warfarin, daily INR 10. Hold ALdactone and K supplement    Signed: Haydee Salter, MD Cardiology Fellow 06/03/2013, 11:04 PM

## 2013-06-03 NOTE — ED Notes (Signed)
PT still in X-ray and CT

## 2013-06-03 NOTE — ED Provider Notes (Signed)
CSN: [id number]     Arrival date & time 06/03/13  1844 History   First MD Initiated Contact with Patient 06/03/13 1846     Chief Complaint  Patient presents with  . Fall   (Consider location/radiation/quality/duration/timing/severity/associated sxs/prior Treatment) HPI Comments: Pt states that she has been having swelling in her feet and she has been taking extra fluid pills:pt states that she also has neuropathy and she legs can't hold her up so she fall some times:pt denies getting dizzy prior to the event:pt states that she is here because she is on coumadin:pt states that she is moving all her extremities at her baseline:ems states that on seen she was complaining of left hip pain:pt lives on her own:pt niece her and states that she has fall a couple of times in the last couple of weeks she state that she is having increasing concern that something is going on with the pts heart:pt denies cp or sob  The history is provided by the patient.    Past Medical History  Diagnosis Date  . Lactose intolerance   . IBS (irritable bowel syndrome)   . Dyslipidemia   . 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 Kidney stone     R ureteral stone  . Coronary artery disease   . S/P hemorrhoidectomy   . Arthritis   . Carotid stenosis, bilateral PER DUPLEX  04-10-2011    RICA  40-59%/  LICA 60-79%  . Cardiomyopathy secondary     EF 25% echo 2013  . Atrial fibrillation     chronic anticoag  . Hypercholesterolemia   . Spinal stenosis   . Neuromuscular disorder     neuropathy d/t amyloidosis  . ANXIETY   . DEPRESSION   . Branch retinal vein occlusion of left eye     12/2011 event - vision improving - Following with optho and retinal specialist for same   Past Surgical History  Procedure Laterality Date  . Hemorrhoid surgery  2002  . Carpal tunnel release  2005    RIGHT  . Cysto/ bladder bx and fulgeration   02-21-2007  . Right sural nerve bx/ right gastrocemius muscle bx  09-25-2009  . Breast biopsy  1992  . Appendectomy  1961  . Extracorporeal shock wave lithotripsy  08-10-2011    RIGHT  . Cystoscopy  09/2011  . Tonsillectomy  10-23-11    child  . Cystoscopy with ureteroscopy  10/28/2011    Procedure: CYSTOSCOPY WITH URETEROSCOPY;  Surgeon: Milford Cage, MD;  Location: [facility];  Service: Urology;  Laterality: Right;  . Eye surgery     Family History  Problem Relation Age of Onset  . Heart disease Mother     Heart failure at a later age  . Arthritis Mother   . Arthritis Father   . COPD Other   . Heart disease      Heart failure at a later age   History  Substance Use Topics  . Smoking status: Former Smoker -- 20 years    Types: Cigarettes    Quit date: 10/28/1990  . Smokeless tobacco: Never Used  . Alcohol Use: No   OB History   Grav Para Term Preterm Abortions TAB SAB Ect Mult Living                 Review of Systems  Constitutional: Negative.   Respiratory: Negative.   Cardiovascular: Negative.  Allergies  Statins  Home Medications   Current Outpatient Rx  Name  Route  Sig  Dispense  Refill  . acetaminophen (TYLENOL) 500 MG tablet   Oral   Take 500 mg by mouth daily as needed for pain.          . Alpha-D-Galactosidase (BEANO PO)   Oral   Take 1 tablet by mouth daily as needed (gas).         . calcitRIOL (ROCALTROL) 0.25 MCG capsule   Oral   Take 0.25 mcg by mouth daily.         . cyanocobalamin (,VITAMIN B-12,) 1000 MCG/ML injection   Intramuscular   Inject 1,000 mcg into the muscle every 30 (thirty) days. Last dose 01/16/13         . cyclobenzaprine (FLEXERIL) 5 MG tablet   Oral   Take 5 mg by mouth at bedtime as needed for muscle spasms.          . diflunisal (DOLOBID) 500 MG TABS   Oral   Take 250 mg by mouth 2 (two) times daily.          Marland Kitchen docusate sodium (STOOL SOFTENER) 100 MG capsule   Oral   Take 100 mg by mouth daily  as needed for constipation.          . fluticasone (FLONASE) 50 MCG/ACT nasal spray   Nasal   Place 2 sprays into the nose daily.   16 g   2   . gabapentin (NEURONTIN) 100 MG capsule   Oral   Take 100-200 mg by mouth. 100 mg every morning and 200 mg in the afternoon.  (sometimes takes 300 mg at once if stomach pain is bad)         . loperamide (IMODIUM) 2 MG capsule   Oral   Take 2 mg by mouth 2 (two) times daily.          . metolazone (ZAROXOLYN) 2.5 MG tablet   Oral   Take 2.5 mg by mouth daily as needed (for excessive weight gain to to fluid).         . midodrine (PROAMATINE) 5 MG tablet      5 mg. Take 5 mg by mouth 3 times daily.         . Multiple Vitamin (MULTIVITAMIN WITH MINERALS) TABS   Oral   Take 1 tablet by mouth daily.         . potassium chloride (K-DUR) 10 MEQ tablet   Oral   Take 30 mEq by mouth 2 (two) times daily.         . Probiotic Product (ALIGN PO)   Oral   Take 1 capsule by mouth daily.          Marland Kitchen SIMETHICONE PO   Oral   Take 1 tablet by mouth daily as needed (for gas). Simethicone Extra Strength - over the counter - pt not sure of dose         . spironolactone (ALDACTONE) 25 MG tablet   Oral   Take 0.5 tablets (12.5 mg total) by mouth as directed. 12.5 mg Tuesday,thursday,saturday   15 tablet   0   . torsemide (DEMADEX) 20 MG tablet   Oral   Take 60 mg by mouth 2 (two) times daily.         Marland Kitchen warfarin (COUMADIN) 2.5 MG tablet      Take 1/2 tablet (1.25 mg) on Monday, Wednesday and Friday and 1 tablet (  2.5 mg) on all other days   30 tablet   1    BP 139/99  Pulse 76  Temp(Src) 97.5 F (36.4 C) (Oral)  Resp 18  SpO2 98%  LMP 08/04/1991 Physical Exam  Nursing note and vitals reviewed. Constitutional: She is oriented to person, place, and time. She appears well-developed and well-nourished.  HENT:  Head: Normocephalic and atraumatic.  Neck: Normal range of motion.  Cardiovascular: Normal rate and regular  rhythm.   Pulmonary/Chest: Effort normal and breath sounds normal.  Abdominal: Soft. Bowel sounds are normal. She exhibits distension.  Musculoskeletal:       Cervical back: Normal.       Thoracic back: Normal.       Lumbar back: Normal.  Mild bilateral edema noted:pt able to move all extremities  Neurological: She is alert and oriented to person, place, and time.  Skin:  Dressed wound to the right lower leg:pt states that she is follow up at the wound clinic and she doesn't want me to unwrap the area:small red area noted to the top of the right buttock    ED Course  Procedures (including critical care time) Labs Review Labs Reviewed  CBC WITH DIFFERENTIAL  URINALYSIS, ROUTINE W REFLEX MICROSCOPIC  TROPONIN I  PROTIME-INR  PRO B NATRIURETIC PEPTIDE   Imaging Review No results found.  EKG Interpretation   None       MDM  No diagnosis found.   Discussed care with Dr. Fonda Kinder left with [facility]    Teressa Lower, NP 06/03/13 2009

## 2013-06-04 ENCOUNTER — Inpatient Hospital Stay (HOSPITAL_COMMUNITY): Payer: Medicare Other

## 2013-06-04 DIAGNOSIS — E875 Hyperkalemia: Secondary | ICD-10-CM

## 2013-06-04 DIAGNOSIS — I5042 Chronic combined systolic (congestive) and diastolic (congestive) heart failure: Secondary | ICD-10-CM

## 2013-06-04 DIAGNOSIS — N179 Acute kidney failure, unspecified: Secondary | ICD-10-CM

## 2013-06-04 LAB — BASIC METABOLIC PANEL
BUN: 66 mg/dL — ABNORMAL HIGH (ref 6–23)
BUN: 65 mg/dL — ABNORMAL HIGH (ref 6–23)
CO2: 25 mEq/L (ref 19–32)
CO2: 26 mEq/L (ref 19–32)
CO2: 27 mEq/L (ref 19–32)
Calcium: 9 mg/dL (ref 8.4–10.5)
Calcium: 8.8 mg/dL (ref 8.4–10.5)
Calcium: 8.5 mg/dL (ref 8.4–10.5)
Calcium: 8.3 mg/dL — ABNORMAL LOW (ref 8.4–10.5)
Chloride: 90 mEq/L — ABNORMAL LOW (ref 96–112)
GFR calc Af Amer: 27 mL/min — ABNORMAL LOW (ref >90)
GFR calc Af Amer: 29 mL/min — ABNORMAL LOW (ref >90)
GFR calc Af Amer: 30 mL/min — ABNORMAL LOW (ref >90)
GFR calc Af Amer: 31 mL/min — ABNORMAL LOW (ref >90)
GFR calc non Af Amer: 23 mL/min — ABNORMAL LOW (ref >90)
GFR calc non Af Amer: 25 mL/min — ABNORMAL LOW (ref >90)
GFR calc non Af Amer: 26 mL/min — ABNORMAL LOW (ref >90)
GFR calc non Af Amer: 27 mL/min — ABNORMAL LOW (ref >90)
Glucose, Bld: 125 mg/dL — ABNORMAL HIGH (ref 70–99)
Glucose, Bld: 108 mg/dL — ABNORMAL HIGH (ref 70–99)
Glucose, Bld: 108 mg/dL — ABNORMAL HIGH (ref 70–99)
Potassium: 5.1 mEq/L (ref 3.5–5.1)
Potassium: 5.4 mEq/L — ABNORMAL HIGH (ref 3.5–5.1)
Sodium: 133 mEq/L — ABNORMAL LOW (ref 135–145)
Sodium: 132 mEq/L — ABNORMAL LOW (ref 135–145)
Sodium: 134 mEq/L — ABNORMAL LOW (ref 135–145)
Sodium: 131 mEq/L — ABNORMAL LOW (ref 135–145)

## 2013-06-04 LAB — BLOOD GAS, VENOUS
Acid-Base Excess: 3.8 mmol/L — ABNORMAL HIGH (ref 0.0–2.0)
Bicarbonate: 28.7 mEq/L — ABNORMAL HIGH (ref 20.0–24.0)
O2 Saturation: 40.6 %
pCO2, Ven: 50.9 mmHg — ABNORMAL HIGH (ref 45.0–50.0)
pH, Ven: 7.37 — ABNORMAL HIGH (ref 7.250–7.300)
pO2, Ven: 0 mmHg — CL (ref 30.0–45.0)

## 2013-06-04 LAB — MRSA PCR SCREENING: MRSA by PCR: NEGATIVE

## 2013-06-04 LAB — LACTIC ACID, PLASMA: Lactic Acid, Venous: 3.3 mmol/L — ABNORMAL HIGH (ref 0.5–2.2)

## 2013-06-04 MED ORDER — SODIUM POLYSTYRENE SULFONATE 15 GM/60ML PO SUSP
30.0000 g | Freq: Once | ORAL | Status: DC
  Filled 2013-06-04: qty 120

## 2013-06-04 MED ORDER — MIDODRINE HCL 5 MG PO TABS
5.0000 mg | ORAL_TABLET | Freq: Three times a day (TID) | ORAL | Status: DC
  Administered 2013-06-04 – 2013-06-06 (×7): 5 mg via ORAL
  Filled 2013-06-04 (×10): qty 1

## 2013-06-04 MED ORDER — ONDANSETRON HCL 4 MG/2ML IJ SOLN: 4.0000 mg | Freq: Four times a day (QID) | INTRAMUSCULAR | Status: DC | PRN

## 2013-06-04 MED ORDER — FUROSEMIDE 10 MG/ML IJ SOLN: 40.0000 mg | Freq: Once | INTRAMUSCULAR | Status: AC

## 2013-06-04 MED ORDER — FUROSEMIDE 10 MG/ML IJ SOLN
40.0000 mg | Freq: Two times a day (BID) | INTRAMUSCULAR | Status: DC
  Administered 2013-06-04 (×2): 40 mg via INTRAVENOUS
  Filled 2013-06-04 (×4): qty 4

## 2013-06-04 MED ORDER — AMIODARONE HCL IN DEXTROSE 360-4.14 MG/200ML-% IV SOLN
30.0000 mg/h | INTRAVENOUS | Status: DC
  Administered 2013-06-04: 30 mg/h via INTRAVENOUS
  Filled 2013-06-04 (×4): qty 200

## 2013-06-04 MED ORDER — AMIODARONE HCL IN DEXTROSE 360-4.14 MG/200ML-% IV SOLN
60.0000 mg/h | INTRAVENOUS | Status: AC
  Administered 2013-06-04: 60 mg/h via INTRAVENOUS
  Filled 2013-06-04: qty 200

## 2013-06-04 MED ORDER — SODIUM CHLORIDE 0.9 % IV SOLN
1.0000 g | Freq: Once | INTRAVENOUS | Status: DC
  Filled 2013-06-04: qty 10

## 2013-06-04 MED ORDER — GABAPENTIN 100 MG PO CAPS
200.0000 mg | ORAL_CAPSULE | Freq: Every day | ORAL | Status: DC
  Administered 2013-06-05: 100 mg via ORAL
  Administered 2013-06-06: 200 mg via ORAL
  Filled 2013-06-04 (×3): qty 2

## 2013-06-04 MED ORDER — METOLAZONE 2.5 MG PO TABS
2.5000 mg | ORAL_TABLET | Freq: Every day | ORAL | Status: DC | PRN
  Filled 2013-06-04: qty 1

## 2013-06-04 MED ORDER — DOCUSATE SODIUM 100 MG PO CAPS
100.0000 mg | ORAL_CAPSULE | Freq: Two times a day (BID) | ORAL | Status: DC
  Administered 2013-06-04 – 2013-06-06 (×5): 100 mg via ORAL
  Filled 2013-06-04 (×6): qty 1

## 2013-06-04 MED ORDER — GABAPENTIN 100 MG PO CAPS
100.0000 mg | ORAL_CAPSULE | Freq: Every day | ORAL | Status: DC
  Administered 2013-06-04 – 2013-06-05 (×2): 100 mg via ORAL
  Filled 2013-06-04 (×3): qty 1

## 2013-06-04 MED ORDER — SODIUM CHLORIDE 0.9 % IJ SOLN
3.0000 mL | Freq: Two times a day (BID) | INTRAMUSCULAR | Status: DC
  Administered 2013-06-04 – 2013-06-06 (×5): 3 mL via INTRAVENOUS

## 2013-06-04 MED ORDER — GABAPENTIN 100 MG PO CAPS
100.0000 mg | ORAL_CAPSULE | Freq: Three times a day (TID) | ORAL | Status: DC
  Administered 2013-06-04: 200 mg via ORAL
  Filled 2013-06-04 (×3): qty 2

## 2013-06-04 MED ORDER — FUROSEMIDE 10 MG/ML IJ SOLN
40.0000 mg | INTRAMUSCULAR | Status: DC
Start: 2013-06-04 — End: 2013-06-04
  Administered 2013-06-04: 40 mg via INTRAVENOUS
  Filled 2013-06-04: qty 4

## 2013-06-04 MED ORDER — ACETAMINOPHEN 325 MG PO TABS
650.0000 mg | ORAL_TABLET | ORAL | Status: DC | PRN
  Administered 2013-06-04 – 2013-06-06 (×3): 650 mg via ORAL
  Filled 2013-06-04 (×3): qty 2

## 2013-06-04 MED ORDER — LOPERAMIDE HCL 2 MG PO CAPS
2.0000 mg | ORAL_CAPSULE | Freq: Two times a day (BID) | ORAL | Status: DC
  Administered 2013-06-04: 2 mg via ORAL
  Filled 2013-06-04 (×6): qty 1

## 2013-06-04 NOTE — ED Notes (Signed)
To call RN stated will call later.

## 2013-06-04 NOTE — Progress Notes (Signed)
SUBJECTIVE:  No acute distress or dyspnea this AM.     PHYSICAL EXAM Filed Vitals:   06/04/13 0400 06/04/13 0458 06/04/13 0600 06/04/13 0800  BP: 80/40 80/40 84/49  76/47  Pulse: 104 102 98 95  Temp:  97.7 F (36.5 C)  98.2 F (36.8 C)  TempSrc:  Axillary  Oral  Resp: 14 14 17 12   Height:      Weight:      SpO2: 98% 94% 94% 96%   General:  Frail Lungs:  Few bilateral basilar crackles Heart:  Irregular Abdomen:  Positive bowel sounds, no rebound no guarding Extremities:  Decreased edema.   LABS: Lab Results  Component Value Date   TROPONINI <0.30 06/04/2013   Results for orders placed during the hospital encounter of 06/03/13 (from the past 24 hour(s))  CBC WITH DIFFERENTIAL     Status: Abnormal   Collection Time    06/03/13  8:17 PM      Result Value Range   WBC 10.7 (*) 4.0 - 10.5 K/uL   RBC 4.43  3.87 - 5.11 MIL/uL   Hemoglobin 13.9  12.0 - 15.0 g/dL   HCT 96.0  45.4 - 09.8 %   MCV 93.7  78.0 - 100.0 fL   MCH 31.4  26.0 - 34.0 pg   MCHC 33.5  30.0 - 36.0 g/dL   RDW 11.9 (*) 14.7 - 82.9 %   Platelets 197  150 - 400 K/uL   Neutrophils Relative % 80 (*) 43 - 77 %   Neutro Abs 8.5 (*) 1.7 - 7.7 K/uL   Lymphocytes Relative 13  12 - 46 %   Lymphs Abs 1.4  0.7 - 4.0 K/uL   Monocytes Relative 7  3 - 12 %   Monocytes Absolute 0.8  0.1 - 1.0 K/uL   Eosinophils Relative 0  0 - 5 %   Eosinophils Absolute 0.0  0.0 - 0.7 K/uL   Basophils Relative 0  0 - 1 %   Basophils Absolute 0.0  0.0 - 0.1 K/uL  TROPONIN I     Status: None   Collection Time    06/03/13  8:17 PM      Result Value Range   Troponin I <0.30  <0.30 ng/mL  PROTIME-INR     Status: Abnormal   Collection Time    06/03/13  8:17 PM      Result Value Range   Prothrombin Time 43.1 (*) 11.6 - 15.2 seconds   INR 4.81 (*) 0.00 - 1.49  PRO B NATRIURETIC PEPTIDE     Status: Abnormal   Collection Time    06/03/13  8:17 PM      Result Value Range   Pro B Natriuretic peptide (BNP) 46285.0 (*) 0 - 125 pg/mL    POTASSIUM     Status: Abnormal   Collection Time    06/03/13  8:17 PM      Result Value Range   Potassium 6.2 (*) 3.5 - 5.1 mEq/L  POCT I-STAT, CHEM 8     Status: Abnormal   Collection Time    06/03/13  8:27 PM      Result Value Range   Sodium 129 (*) 135 - 145 mEq/L   Potassium 6.4 (*) 3.5 - 5.1 mEq/L   Chloride 96  96 - 112 mEq/L   BUN 71 (*) 6 - 23 mg/dL   Creatinine, Ser 5.62 (*) 0.50 - 1.10 mg/dL   Glucose, Bld 87  70 - 99 mg/dL  Calcium, Ion 0.96 (*) 1.13 - 1.30 mmol/L   TCO2 30  0 - 100 mmol/L   Hemoglobin 15.6 (*) 12.0 - 15.0 g/dL   HCT 19.1  47.8 - 29.5 %   Comment NOTIFIED PHYSICIAN    URINALYSIS, ROUTINE W REFLEX MICROSCOPIC     Status: None   Collection Time    06/03/13 10:52 PM      Result Value Range   Color, Urine YELLOW  YELLOW   APPearance CLEAR  CLEAR   Specific Gravity, Urine 1.011  1.005 - 1.030   pH 5.0  5.0 - 8.0   Glucose, UA NEGATIVE  NEGATIVE mg/dL   Hgb urine dipstick NEGATIVE  NEGATIVE   Bilirubin Urine NEGATIVE  NEGATIVE   Ketones, ur NEGATIVE  NEGATIVE mg/dL   Protein, ur NEGATIVE  NEGATIVE mg/dL   Urobilinogen, UA 0.2  0.0 - 1.0 mg/dL   Nitrite NEGATIVE  NEGATIVE   Leukocytes, UA NEGATIVE  NEGATIVE  BASIC METABOLIC PANEL     Status: Abnormal   Collection Time    06/04/13 12:12 AM      Result Value Range   Sodium 133 (*) 135 - 145 mEq/L   Potassium 5.1  3.5 - 5.1 mEq/L   Chloride 91 (*) 96 - 112 mEq/L   CO2 25  19 - 32 mEq/L   Glucose, Bld 128 (*) 70 - 99 mg/dL   BUN 70 (*) 6 - 23 mg/dL   Creatinine, Ser 6.21 (*) 0.50 - 1.10 mg/dL   Calcium 9.0  8.4 - 30.8 mg/dL   GFR calc non Af Amer 23 (*) >90 mL/min   GFR calc Af Amer 27 (*) >90 mL/min  PHOSPHORUS     Status: Abnormal   Collection Time    06/04/13 12:12 AM      Result Value Range   Phosphorus 4.7 (*) 2.3 - 4.6 mg/dL  MRSA PCR SCREENING     Status: None   Collection Time    06/04/13  1:18 AM      Result Value Range   MRSA by PCR NEGATIVE  NEGATIVE  TROPONIN I     Status:  None   Collection Time    06/04/13  2:30 AM      Result Value Range   Troponin I <0.30  <0.30 ng/mL  LACTIC ACID, PLASMA     Status: Abnormal   Collection Time    06/04/13  2:30 AM      Result Value Range   Lactic Acid, Venous 3.3 (*) 0.5 - 2.2 mmol/L  MAGNESIUM     Status: None   Collection Time    06/04/13  2:30 AM      Result Value Range   Magnesium 2.2  1.5 - 2.5 mg/dL  BLOOD GAS, VENOUS     Status: Abnormal   Collection Time    06/04/13  2:43 AM      Result Value Range   pH, Ven 7.370 (*) 7.250 - 7.300   pCO2, Ven 50.9 (*) 45.0 - 50.0 mmHg   pO2, Ven 0.0 (*) 30.0 - 45.0 mmHg   Bicarbonate 28.7 (*) 20.0 - 24.0 mEq/L   TCO2 30.3  0 - 100 mmol/L   Acid-Base Excess 3.8 (*) 0.0 - 2.0 mmol/L   O2 Saturation 40.6     Patient temperature 98.6     Drawn by COLLECTED BY NURSE     Sample type VENOUS    BASIC METABOLIC PANEL     Status: Abnormal  Collection Time    06/04/13  5:50 AM      Result Value Range   Sodium 132 (*) 135 - 145 mEq/L   Potassium 5.4 (*) 3.5 - 5.1 mEq/L   Chloride 90 (*) 96 - 112 mEq/L   CO2 26  19 - 32 mEq/L   Glucose, Bld 125 (*) 70 - 99 mg/dL   BUN 69 (*) 6 - 23 mg/dL   Creatinine, Ser 3.66 (*) 0.50 - 1.10 mg/dL   Calcium 8.8  8.4 - 44.0 mg/dL   GFR calc non Af Amer 25 (*) >90 mL/min   GFR calc Af Amer 29 (*) >90 mL/min  TROPONIN I     Status: None   Collection Time    06/04/13  5:50 AM      Result Value Range   Troponin I <0.30  <0.30 ng/mL    Intake/Output Summary (Last 24 hours) at 06/04/13 0941 Last data filed at 06/04/13 0655  Gross per 24 hour  Intake 150.63 ml  Output   1050 ml  Net -899.37 ml     ASSESSMENT AND PLAN:  CHF:  The patient is well known to me.  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  We see her frequently, talk to her weekly adjusting her meds and check labs routinely.  For the most part we have managed to keep her out of the hospital.  She has not wanted to have end of life discussions or to consider hospice.   Renal has tried to keep her off of Zaroxolyn.  However, at time this has been the only med that will help with her chronic volume overload.  She is very tenuous and she was admitted with acute on chronic CKD with hyperkalemia and progressive weakness.  We will keep her in the hospital to try to tune up the HF again.  There is no need for a repeat echo.    HYPERKALEMIA:  Creat is slightly better.  Potassium is down.  She is not getting spironolactone and Zaroxolyn is being held.   ATRIAL FIB:  Stop the amiodarone.    Fayrene Fearing [facility] 06/04/2013 9:41 AM

## 2013-06-04 NOTE — ED Notes (Signed)
Bed switched from 2915 to 2613, flow manager called and informed me. Attempted to call report to 2600, RN will call me back.

## 2013-06-04 NOTE — ED Provider Notes (Signed)
Medical screening examination/treatment/procedure(s) were performed by non-physician practitioner and as supervising physician I was immediately available for consultation/collaboration.    Shamanda Len J. Daleiza Bacchi, MD 06/04/13 1609 

## 2013-06-05 ENCOUNTER — Other Ambulatory Visit: Payer: Medicare Other

## 2013-06-05 DIAGNOSIS — I5043 Acute on chronic combined systolic (congestive) and diastolic (congestive) heart failure: Principal | ICD-10-CM

## 2013-06-05 DIAGNOSIS — I509 Heart failure, unspecified: Secondary | ICD-10-CM

## 2013-06-05 LAB — BASIC METABOLIC PANEL
BUN: 61 mg/dL — ABNORMAL HIGH (ref 6–23)
CO2: 29 mEq/L (ref 19–32)
CO2: 30 mEq/L (ref 19–32)
Calcium: 8.2 mg/dL — ABNORMAL LOW (ref 8.4–10.5)
Calcium: 8.6 mg/dL (ref 8.4–10.5)
Calcium: 8.7 mg/dL (ref 8.4–10.5)
Chloride: 89 mEq/L — ABNORMAL LOW (ref 96–112)
Chloride: 93 mEq/L — ABNORMAL LOW (ref 96–112)
Chloride: 94 mEq/L — ABNORMAL LOW (ref 96–112)
Creatinine, Ser: 1.57 mg/dL — ABNORMAL HIGH (ref 0.50–1.10)
Creatinine, Ser: 1.71 mg/dL — ABNORMAL HIGH (ref 0.50–1.10)
GFR calc Af Amer: 31 mL/min — ABNORMAL LOW (ref >90)
GFR calc Af Amer: 33 mL/min — ABNORMAL LOW (ref >90)
Glucose, Bld: 80 mg/dL (ref 70–99)
Glucose, Bld: 96 mg/dL (ref 70–99)
Potassium: 2.4 mEq/L — CL (ref 3.5–5.1)
Potassium: 3.5 mEq/L (ref 3.5–5.1)
Potassium: 3.3 mEq/L — ABNORMAL LOW (ref 3.5–5.1)
Sodium: 135 mEq/L (ref 135–145)
Sodium: 139 mEq/L (ref 135–145)
Sodium: 139 mEq/L (ref 135–145)

## 2013-06-05 MED ORDER — DIPHENHYDRAMINE HCL 25 MG PO CAPS
25.0000 mg | ORAL_CAPSULE | Freq: Every evening | ORAL | Status: DC | PRN
  Administered 2013-06-05: 25 mg via ORAL
  Filled 2013-06-05: qty 1

## 2013-06-05 MED ORDER — POTASSIUM CHLORIDE CRYS ER 20 MEQ PO TBCR: 40.0000 meq | EXTENDED_RELEASE_TABLET | Freq: Three times a day (TID) | ORAL | Status: DC

## 2013-06-05 MED ORDER — POTASSIUM CHLORIDE CRYS ER 20 MEQ PO TBCR
40.0000 meq | EXTENDED_RELEASE_TABLET | Freq: Four times a day (QID) | ORAL | Status: DC
  Administered 2013-06-05 – 2013-06-06 (×6): 40 meq via ORAL
  Filled 2013-06-05 (×8): qty 2

## 2013-06-05 MED ORDER — POTASSIUM CHLORIDE CRYS ER 20 MEQ PO TBCR
40.0000 meq | EXTENDED_RELEASE_TABLET | Freq: Once | ORAL | Status: AC
  Administered 2013-06-05: 40 meq via ORAL
  Filled 2013-06-05: qty 2

## 2013-06-05 MED ORDER — TORSEMIDE 20 MG PO TABS
40.0000 mg | ORAL_TABLET | Freq: Once | ORAL | Status: AC
  Administered 2013-06-05: 40 mg via ORAL
  Filled 2013-06-05: qty 2

## 2013-06-05 NOTE — Evaluation (Signed)
Physical Therapy Evaluation Patient Details Name: Miranda [name] MRN: [id number] DOB: July 28, 1941 Today's Date: 06/05/2013 Time: 4098-1191 PT Time Calculation (min): 35 min  PT Assessment / Plan / Recommendation History of Present Illness  Patient is a 72 yo female admitted with h/o end stage heart failure 2/2 amyloidosis, CKD, CAD and A fib with RBBB who p/w to ER today with a mechanic fall.  Clinical Impression  Patient presents with problems listed below.  Will benefit from acute PT to maximize independence prior to discharge home.  Patient reports she has arranged 24 hour assist.  She also has an OP PT appointment arranged.    PT Assessment  Patient needs continued PT services    Follow Up Recommendations  Outpatient PT;Supervision/Assistance - 24 hour    Does the patient have the potential to tolerate intense rehabilitation      Barriers to Discharge Decreased caregiver support Patient lives alone.  Has arranged 24 hour assist per patient.    Equipment Recommendations  None recommended by PT    Recommendations for Other Services     Frequency Min 3X/week    Precautions / Restrictions Precautions Precautions: Fall Precaution Comments: Severe neuropathy bil LE's Restrictions Weight Bearing Restrictions: No   Pertinent Vitals/Pain       Mobility  Bed Mobility Bed Mobility: Supine to Sit;Sit to Supine Supine to Sit: 5: Supervision;With rails;HOB elevated Sit to Supine: 4: Min assist;HOB elevated Details for Bed Mobility Assistance: Verbal cues for technique.  Increased time for mobility.  Assist to move LE's onto bed to return to supine.  Patient sleeps in recliner at home. Transfers Transfers: Sit to Stand;Stand to Sit Sit to Stand: 4: Min guard;With upper extremity assist;From bed Stand to Sit: 5: Supervision;With upper extremity assist;To bed Details for Transfer Assistance: Verbal cues for hand placement.  Assist for balance/safety  only. Ambulation/Gait Ambulation/Gait Assistance: 4: Min assist Ambulation Distance (Feet): 42 Feet Assistive device: Rolling walker Ambulation/Gait Assistance Details: Verbal cues for safe use of RW (patient used to rollator).  Patient with decreased DF during swing phase bil, and foot flat rather than heel strike.  Slightly unstable with gait. Gait Pattern: Step-through pattern;Decreased step length - right;Decreased step length - left;Decreased dorsiflexion - right;Decreased dorsiflexion - left;Right steppage;Left steppage;Right foot flat;Left foot flat;Trunk flexed Gait velocity: Slow gait speed    Exercises     PT Diagnosis: Difficulty walking;Abnormality of gait;Generalized weakness  PT Problem List: Decreased strength;Decreased activity tolerance;Decreased balance;Decreased mobility;Decreased knowledge of use of DME;Cardiopulmonary status limiting activity PT Treatment Interventions: DME instruction;Gait training;Stair training;Functional mobility training;Balance training;Patient/family education     PT Goals(Current goals can be found in the care plan section) Acute Rehab PT Goals Patient Stated Goal: To return home. PT Goal Formulation: With patient Time For Goal Achievement: 06/12/13 Potential to Achieve Goals: Good  Visit Information  Last PT Received On: 06/05/13 Assistance Needed: +1 History of Present Illness: Patient is a 72 yo female admitted with h/o end stage heart failure 2/2 amyloidosis, CKD, CAD and A fib with RBBB who p/w to ER today with a Curator fall.       Prior Functioning  Home Living Family/patient expects to be discharged to:: Private residence Living Arrangements: Alone Available Help at Discharge: Family;Friend(s);Available 24 hours/day (Patient reports she has 24 hour assist lined up) Type of Home: House Home Access: Stairs to enter [employer] of Steps: 2     one step onto the porch and second step up into the house Entrance  Stairs-Rails:  None Home Layout: One level Home Equipment: Walker - 4 wheels;Walker - 2 wheels;Wheelchair - manual;Cane - single point Additional Comments: has raised arms on one of the toilets to help with sit to stand. Used 4 wheeled walker in the house and out in the community.  But to get from house to car she used a 2 wheeled walker. Prior Function Level of Independence: Independent with assistive device(s);Needs assistance ADL's / Homemaking Assistance Needed: Takes sponge baths.  Housekeeper for cleaning. Communication Communication: No difficulties Dominant Hand: Right    Cognition  Cognition Arousal/Alertness: Awake/alert Behavior During Therapy: WFL for tasks assessed/performed Overall Cognitive Status: Within Functional Limits for tasks assessed    Extremity/Trunk Assessment Upper Extremity Assessment Upper Extremity Assessment: Overall WFL for tasks assessed Lower Extremity Assessment Lower Extremity Assessment: RLE deficits/detail;LLE deficits/detail RLE Deficits / Details: Strength 3+/5 at hip/knee.  DF is 2-/5.  Wounds and Unna boots on RLE. RLE Sensation: history of peripheral neuropathy RLE Coordination: decreased gross motor LLE Deficits / Details: Strength grossly 3+/5 at hip/knee.  DF is 2/5.  Compression dressing on LLE. LLE Sensation: history of peripheral neuropathy LLE Coordination: decreased gross motor Cervical / Trunk Assessment Cervical / Trunk Assessment: Normal   Balance    End of Session PT - End of Session Equipment Utilized During Treatment: Gait belt Activity Tolerance: Patient limited by fatigue Patient left: in bed;with call bell/phone within reach Nurse Communication: Mobility status  GP     Vena Austria 06/05/2013, 4:57 PM Durenda Hurt. Renaldo Fiddler, [facility] Acute Rehab Services Pager [phone]

## 2013-06-05 NOTE — Progress Notes (Signed)
Utilization Review Completed.Miranda [name] T10/13/2014  

## 2013-06-05 NOTE — Progress Notes (Signed)
SUBJECTIVE:  No acute distress or dyspnea this AM.  Feeling better with the fluid off.    PHYSICAL EXAM Filed Vitals:   06/04/13 2200 06/05/13 0040 06/05/13 0349 06/05/13 0600  BP: 88/48 88/53 86/45  77/46  Pulse: 87 89 86 87  Temp:  98.3 F (36.8 C) 98 F (36.7 C)   TempSrc:  Oral Oral   Resp: 17 18 19 15   Height:      Weight:   138 lb 14.2 oz (63 kg)   SpO2: 94% 94% 95% 94%   General:  Frail Lungs:  Few bilateral basilar crackles Heart:  Irregular Abdomen:  Positive bowel sounds, no rebound no guarding Extremities:  No edema.   LABS: Lab Results  Component Value Date   TROPONINI <0.30 06/04/2013   Results for orders placed during the hospital encounter of 06/03/13 (from the past 24 hour(s))  BASIC METABOLIC PANEL     Status: Abnormal   Collection Time    06/04/13  2:59 PM      Result Value Range   Sodium 134 (*) 135 - 145 mEq/L   Potassium 3.9  3.5 - 5.1 mEq/L   Chloride 91 (*) 96 - 112 mEq/L   CO2 26  19 - 32 mEq/L   Glucose, Bld 108 (*) 70 - 99 mg/dL   BUN 66 (*) 6 - 23 mg/dL   Creatinine, Ser 0.98 (*) 0.50 - 1.10 mg/dL   Calcium 8.5  8.4 - 11.9 mg/dL   GFR calc non Af Amer 26 (*) >90 mL/min   GFR calc Af Amer 30 (*) >90 mL/min  TROPONIN I     Status: None   Collection Time    06/04/13  2:59 PM      Result Value Range   Troponin I <0.30  <0.30 ng/mL  BASIC METABOLIC PANEL     Status: Abnormal   Collection Time    06/04/13  6:04 PM      Result Value Range   Sodium 131 (*) 135 - 145 mEq/L   Potassium 3.2 (*) 3.5 - 5.1 mEq/L   Chloride 89 (*) 96 - 112 mEq/L   CO2 27  19 - 32 mEq/L   Glucose, Bld 108 (*) 70 - 99 mg/dL   BUN 65 (*) 6 - 23 mg/dL   Creatinine, Ser 1.47 (*) 0.50 - 1.10 mg/dL   Calcium 8.3 (*) 8.4 - 10.5 mg/dL   GFR calc non Af Amer 27 (*) >90 mL/min   GFR calc Af Amer 31 (*) >90 mL/min  BASIC METABOLIC PANEL     Status: Abnormal   Collection Time    06/05/13 12:02 AM      Result Value Range   Sodium 130 (*) 135 - 145 mEq/L   Potassium  2.8 (*) 3.5 - 5.1 mEq/L   Chloride 88 (*) 96 - 112 mEq/L   CO2 28  19 - 32 mEq/L   Glucose, Bld 99  70 - 99 mg/dL   BUN 62 (*) 6 - 23 mg/dL   Creatinine, Ser 8.29 (*) 0.50 - 1.10 mg/dL   Calcium 8.2 (*) 8.4 - 10.5 mg/dL   GFR calc non Af Amer 28 (*) >90 mL/min   GFR calc Af Amer 33 (*) >90 mL/min  BASIC METABOLIC PANEL     Status: Abnormal   Collection Time    06/05/13  5:23 AM      Result Value Range   Sodium 135  135 - 145 mEq/L   Potassium  2.4 (*) 3.5 - 5.1 mEq/L   Chloride 89 (*) 96 - 112 mEq/L   CO2 33 (*) 19 - 32 mEq/L   Glucose, Bld 80  70 - 99 mg/dL   BUN 61 (*) 6 - 23 mg/dL   Creatinine, Ser 8.11 (*) 0.50 - 1.10 mg/dL   Calcium 8.7  8.4 - 91.4 mg/dL   GFR calc non Af Amer 27 (*) >90 mL/min   GFR calc Af Amer 31 (*) >90 mL/min    Intake/Output Summary (Last 24 hours) at 06/05/13 0750 Last data filed at 06/05/13 0351  Gross per 24 hour  Intake 148.82 ml  Output   2975 ml  Net -2826.18 ml     ASSESSMENT AND PLAN:  CHF:  The patient is well known to me.  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  She is very tenuous and she was admitted with acute on chronic CKD with hyperkalemia and progressive weakness.  She has not wanted to have end of life discussions or to consider hospice.  Can switch to oral Demadex today.  HYPERKALEMIA:  Creat is at baseline.  Now hypokalemic and we will begin to supplement again.   ATRIAL FIB:  Stop the amiodarone.    Rollene Rotunda 06/05/2013 7:50 AM

## 2013-06-05 NOTE — Progress Notes (Signed)
Date of notification:  06/05/2013  Time of notification:  0750   Critical value read back: yes K 2.4  Nurse who received alert:  Tula Nakayama RN  MD notified (1st page):  Ward Givens   Time of first page:  [phone]  MD notified (2nd page):  Time of second page:  Responding MD:    Time MD responded:

## 2013-06-06 ENCOUNTER — Encounter (HOSPITAL_COMMUNITY): Payer: Self-pay | Admitting: Physician Assistant

## 2013-06-06 ENCOUNTER — Other Ambulatory Visit: Payer: Self-pay | Admitting: Physician Assistant

## 2013-06-06 DIAGNOSIS — I480 Paroxysmal atrial fibrillation: Secondary | ICD-10-CM

## 2013-06-06 DIAGNOSIS — E876 Hypokalemia: Secondary | ICD-10-CM

## 2013-06-06 DIAGNOSIS — E875 Hyperkalemia: Secondary | ICD-10-CM

## 2013-06-06 LAB — BASIC METABOLIC PANEL
BUN: 54 mg/dL — ABNORMAL HIGH (ref 6–23)
BUN: 52 mg/dL — ABNORMAL HIGH (ref 6–23)
CO2: 31 mEq/L (ref 19–32)
CO2: 32 mEq/L (ref 19–32)
CO2: 33 mEq/L — ABNORMAL HIGH (ref 19–32)
Calcium: 8.3 mg/dL — ABNORMAL LOW (ref 8.4–10.5)
Chloride: 95 mEq/L — ABNORMAL LOW (ref 96–112)
Chloride: 95 mEq/L — ABNORMAL LOW (ref 96–112)
Chloride: 97 mEq/L (ref 96–112)
Creatinine, Ser: 1.44 mg/dL — ABNORMAL HIGH (ref 0.50–1.10)
GFR calc Af Amer: 39 mL/min — ABNORMAL LOW (ref >90)
GFR calc non Af Amer: 34 mL/min — ABNORMAL LOW (ref >90)
GFR calc non Af Amer: 35 mL/min — ABNORMAL LOW (ref >90)
Glucose, Bld: 107 mg/dL — ABNORMAL HIGH (ref 70–99)
Glucose, Bld: 93 mg/dL (ref 70–99)
Glucose, Bld: 95 mg/dL (ref 70–99)
Potassium: 3.7 mEq/L (ref 3.5–5.1)
Sodium: 138 mEq/L (ref 135–145)

## 2013-06-06 LAB — PROTIME-INR: INR: 3.06 — ABNORMAL HIGH (ref 0.00–1.49)

## 2013-06-06 MED ORDER — TORSEMIDE 20 MG PO TABS: 40.0000 mg | ORAL_TABLET | Freq: Two times a day (BID) | ORAL | Status: DC

## 2013-06-06 MED ORDER — POTASSIUM CHLORIDE ER 20 MEQ PO TBCR: EXTENDED_RELEASE_TABLET | ORAL | Status: DC

## 2013-06-06 MED ORDER — WARFARIN SODIUM 2.5 MG PO TABS: ORAL_TABLET | ORAL | Status: DC

## 2013-06-06 NOTE — Progress Notes (Signed)
ANTICOAGULATION CONSULT NOTE - Initial Consult  Pharmacy Consult for Coumadin Indication: atrial fibrillation  Allergies  Allergen Reactions  . Statins Other (See Comments)    Leg cramps and restlessness    Patient Measurements: Height: 5\' 5"  (165.1 cm) Weight: 138 lb 14.2 oz (63 kg) IBW/kg (Calculated) : 57 Heparin Dosing Weight:    Vital Signs: Temp: 97.9 F (36.6 C) (10/14 0858) Temp src: Oral (10/14 0858) BP: 81/61 mmHg (10/14 0858) Pulse Rate: 80 (10/14 0858)  Labs:  Recent Labs  06/03/13 2017 06/03/13 2027  06/04/13 0230 06/04/13 0550 06/04/13 1459  06/05/13 1808 06/06/13 06/06/13 0455 06/06/13 1034  HGB 13.9 15.6*  --   --   --   --   --   --   --   --   --   HCT 41.5 46.0  --   --   --   --   --   --   --   --   --   PLT 197  --   --   --   --   --   --   --   --   --   --   LABPROT 43.1*  --   --   --   --   --   --   --   --   --  30.5*  INR 4.81*  --   --   --   --   --   --   --   --   --  3.06*  CREATININE  --  2.20*  < >  --  1.90* 1.85*  < > 1.57* 1.49* 1.49*  --   TROPONINI <0.30  --   --  <0.30 <0.30 <0.30  --   --   --   --   --   < > = values in this interval not displayed.  Estimated Creatinine Clearance: 30.3 ml/min (by C-G formula based on Cr of 1.49).   Medical History: Past Medical History  Diagnosis Date  . Lactose intolerance   . IBS (irritable bowel syndrome)   . Dyslipidemia   . 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 Kidney stone     R ureteral stone  . Coronary artery disease     a. Cardiac Cath in [place] 2008: nonobstructive coronary disease at the time with the left main 20% stenosis, the LAD luminal irregularity 40% stenosis, the ramus intermediate 95% ostial stenosis. The right coronary artery is dominant and had 40% proximal, 25% mid stenosis. She was treated medically.  . S/P hemorrhoidectomy   . Arthritis   . Carotid stenosis, bilateral     a.  Dopp 05/2012: 0-39% RICA, 40-59% LICA..  . Cardiomyopathy secondary     EF 25% echo 2013  . Atrial fibrillation     chronic anticoag  . Spinal stenosis   . Neuromuscular disorder     neuropathy d/t amyloidosis  . ANXIETY   . DEPRESSION   . Branch retinal vein occlusion of left eye     12/2011 event - vision improving - Following with optho and retinal specialist for same  . Combined systolic and diastolic heart failure     a. Due to cardiac amyloidosis - EF 20-25%.   Marland Kitchen Hyperkalemia     a. Adm 01/2013 a/w VT.  Marland Kitchen Ventricular tachyarrhythmia     a. 01/2013 in setting of hyperkalemia requiring  cardioversion.  . Cardiac amyloidosis   . Hypotension   . RBBB     Medications:  Prescriptions prior to admission  Medication Sig Dispense Refill  . acetaminophen (TYLENOL) 500 MG tablet Take 500 mg by mouth daily as needed for pain.       . Alpha-D-Galactosidase (BEANO PO) Take 1 tablet by mouth daily as needed (gas).      . calcitRIOL (ROCALTROL) 0.25 MCG capsule Take 0.25 mcg by mouth daily.      . cyanocobalamin (,VITAMIN B-12,) 1000 MCG/ML injection Inject 1,000 mcg into the muscle every 30 (thirty) days. Last dose 01/16/13      . cyclobenzaprine (FLEXERIL) 5 MG tablet Take 5 mg by mouth at bedtime as needed for muscle spasms.       . diflunisal (DOLOBID) 500 MG TABS Take 250 mg by mouth 2 (two) times daily.       Marland Kitchen docusate sodium (STOOL SOFTENER) 100 MG capsule Take 100 mg by mouth daily as needed for constipation.       . fluticasone (FLONASE) 50 MCG/ACT nasal spray Place 2 sprays into the nose daily.  16 g  2  . gabapentin (NEURONTIN) 100 MG capsule Take 100-200 mg by mouth 2 (two) times daily. Takes 200 mg in the AM and 100 mg at dinner time.  Sometimes takes all 300 mg together depending on stomach pain.      Marland Kitchen loperamide (IMODIUM) 2 MG capsule Take 2 mg by mouth 2 (two) times daily.       . metolazone (ZAROXOLYN) 2.5 MG tablet Take 2.5 mg by mouth daily as needed (for excessive weight  gain to to fluid).      . midodrine (PROAMATINE) 5 MG tablet 5 mg. Take 5 mg by mouth 3 times daily.      . Multiple Vitamin (MULTIVITAMIN WITH MINERALS) TABS Take 1 tablet by mouth daily.      . potassium chloride (K-DUR) 10 MEQ tablet Take 30 mEq by mouth 2 (two) times daily.      . Probiotic Product (ALIGN PO) Take 1 capsule by mouth daily.       Marland Kitchen SIMETHICONE PO Take 1 tablet by mouth daily as needed (for gas). Simethicone Extra Strength - over the counter - pt not sure of dose      . spironolactone (ALDACTONE) 25 MG tablet Take 0.5 tablets (12.5 mg total) by mouth as directed. 12.5 mg Tuesday,thursday,saturday  15 tablet  0  . torsemide (DEMADEX) 20 MG tablet Take 60 mg by mouth 2 (two) times daily.      Marland Kitchen warfarin (COUMADIN) 2.5 MG tablet Take 1 tablet (2.5 mg) on Monday, Wednesday and Friday and 1/2 tablet (1.25 mg) on all other days        Assessment: CHF, fall  Anticoagulation: Warfarin PTA for afib, admit INR 4.81 on 2.5 MWF, 1.25mg TTSS. Paged Dr. Antoine Poche who is ok to restart Coumadin today, but INR still elevated 3.06 (no Coumadin since_________)  Infectious Disease: WBC 10.7, afebrile, no current abx Cardiovascular: CAD, HLD, CHF (end-stage due to amyloid). BP only 81/61, HR 80. +midodrine, K+ QID--f/u K+ levels daily  Endocrinology: gluc 95 on bmet Gastrointestinal / Nutrition: on doc, loperamide  Neurology: gabapentin Nephrology: CKD: SCr 1.49 down, K 2.4 now up to 3.7 (KDur 40 QID) Pulmonary:  Hematology / Oncology: CBC ok PTA Medication Issues: diflunisal, metolazone, mvi, aldactone, torsemide Best Practices: Coumadin   Goal of Therapy:  INR 2-3 Monitor platelets by anticoagulation protocol: Yes  Plan:  Hold Coumadin today. Daily INR.   Sylus Stgermain S. Merilynn Finland, PharmD, BCPS Clinical Staff Pharmacist Pager [phone]  Misty Stanley Stillinger 06/06/2013,11:45 AM

## 2013-06-06 NOTE — Discharge Summary (Signed)
Discharge Summary   Patient ID: Miranda [name] MRN: [id number], DOB/AGE: 1941/04/29 72 y.o. Admit date: 06/03/2013 D/C date:     06/06/2013  Primary Cardiologist: Mellony Danziger  Primary Discharge Diagnoses:  1. Acute on chronic combined systolic and diastolic CHF 2. Familial amyloidosis with cardiac amyloid - dx 09/2009: cardiomyopathy, periph neuropathy, motor weakness and autonomic GI dysfx  3. Acute kidney injury on chronic kidney disease stage III 4. Paroxysmal atrial fibrillation with RVR this admission 4. Hypotension  5. Hyperkalemia 6. Hypokalemia 7. Recently sprained ankle  Secondary Discharge Diagnoses:  1. Lactose intolerance 2. IBS (irritable bowel syndrome)  3. Dyslipidemia  4. Kidney stone R ureteral stone  5. Coronary artery disease, nonobstructive by cath 2008 in [place] 7. Arthritis  8. Carotid stenosis, bilateral PER DUPLEX 04-10-2011 RICA 40-59%/ LICA 60-79%  9. RBBB 10. Spinal stenosis  11. Neuromuscular disorder neuropathy d/t amyloidosis  12. Anxiety 13. Depression 14. Branch retinal vein occlusion of left eye 12/2011 event - vision improving - Following with optho and retinal specialist for same  15. H/o VT 01/2013 requiring cardioversion in setting of hyperkalemia  Hospital Course: [name] is a 72 y/o F with history of end stage combined systolic/diastolic CHF secondary to amyloidosis, CKD, VT 01/2013 in the setting of hyperkalemia, CAD, carotid artery disease who presented to [facility] 06/03/2013 with a mechanical fall. She recently had stopped Metolazone by nephrology and therefore had increased weight of 5 lbs. She called Dr. Antoine Poche and metolazone was restarted two days prior to admission. However, her UOP has stayed low since then. While walking on day of admission, her legs suddenly gave out with recently sprained ankle and she fell on her back. No LOS or altered consciousness, CP, SOB, or apparent injury after the fall. CT head was negative. She did  endorseincreased LEE and orthopnea. In the ED, her potassium was found to be above 6 and Cr elevated to 2.2 (recently 1.3-1.6 as outpatient). She was treated with NS due to concern for AKI as well as calcium, kayexalate, and albuterol. She was treated with IV Lasix for a/c CHF. She was also temporarily placed on amiodarone drip for atrial fib with RVR. Lactic acid was high at 3.3. TSH was WNL. Aldactone and potassium supplement were held initially. Troponins remained negative. She subsequently became hypokalemic down to 2.4 requiring reinitiation of supplementation. With diuresis she is -6.2 L today with a discharge wt of 138lb. Cr has improved to 1.49. Today she is feeling better. Blood pressure remains chronically low. Dr. Antoine Poche has seen and examined the patient today and feels she is stable for discharge. PT saw the patient who recommended outpatient PT and 24-hour supervision/assistance which the patient reported she had. She refused HHRN. Her family has attempted to convince her to move to ALF but she refuses. She also has not wanted to have end of life discussions or to consider hospice.   For now she will go home on Demadex 40mg  BID and KCl BID with BMET on Thursday. I discussed her Coumadin dosing given supratherapeutic INR of 4.81 on admission; it remains high at 3.03 today. Pharmacy recommended to continue to hold coumadin and agrees with recheck on Thursday 06/08/13 when she comes in for her BMET. She will have the next available 7 day TOC appointment as well. Spirinolactone remains on hold. She is not on ACEI/ARB due to hyperkalemia.   Discharge Vitals: Blood pressure 87/48, pulse 92, temperature 98 F (36.7 C), temperature source Oral, resp.  rate 24, height 5\' 5"  (1.651 m), weight 138 lb 14.2 oz (63 kg), last menstrual period 08/04/1991, SpO2 96.00%.  Labs: Lab Results  Component Value Date   WBC 10.7* 06/03/2013   HGB 15.6* 06/03/2013   HCT 46.0 06/03/2013   MCV 93.7  06/03/2013   PLT 197 06/03/2013     Recent Labs Lab 06/06/13 0455  NA 141  K 3.7  CL 97  CO2 33*  BUN 54*  CREATININE 1.49*  CALCIUM 8.5  GLUCOSE 95    Recent Labs  06/03/13 2017 06/04/13 0230 06/04/13 0550 06/04/13 1459  TROPONINI <0.30 <0.30 <0.30 <0.30   Lab Results  Component Value Date   CHOL 174 06/08/2012   HDL 59.80 06/08/2012   LDLCALC 99 06/08/2012   TRIG 78.0 06/08/2012     Diagnostic Studies/Procedures   Dg Chest 2 View  06/03/2013   CLINICAL DATA:  Larey Seat.  EXAM: CHEST  2 VIEW  COMPARISON:  09/14/2012.  FINDINGS: With stable enlargement of the cardiac silhouette. Increased bilateral pleural fluid. No fracture or pneumothorax seen. Thoracic spine degenerative changes.  IMPRESSION: Stable cardiomegaly with increased bilateral pleural fluid.   Electronically Signed   By: Gordan Payment M.D.   On: 06/03/2013 20:07   Dg Hip Complete Left  06/03/2013   CLINICAL DATA:  Left hip pain after fall.  EXAM: LEFT HIP - COMPLETE 2+ VIEW  COMPARISON:  None.  FINDINGS: There is no evidence of hip fracture or dislocation. Severe narrowing of the left hip joint is noted with sclerosis consistent with degenerative joint.  IMPRESSION: No fracture or dislocation is noted. 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   Electronically Signed   By: Roque Lias M.D.   On: 06/03/2013 20:09   Dg Ankle Complete Right  05/29/2013   CLINICAL DATA:  Recent traumatic injury with pain and swelling  EXAM: RIGHT ANKLE - COMPLETE 3+ VIEW  COMPARISON:  None.  FINDINGS: There is no evidence of fracture, dislocation, or joint effusion. There is no evidence of arthropathy or other focal bone abnormality. Soft tissues are unremarkable.  IMPRESSION: No acute fracture is noted.   Electronically Signed   By: Alcide Clever M.D.   On: 05/29/2013 16:23   Ct Head Wo Contrast  06/03/2013   CLINICAL DATA:  Larey Seat and hit the left side of her head. Taking Coumadin.  EXAM: CT HEAD WITHOUT CONTRAST   TECHNIQUE: Contiguous axial images were obtained from the base of the skull through the vertex without intravenous contrast.  COMPARISON:  09/14/2012.  FINDINGS: Stable mildly enlarged ventricles and subarachnoid spaces. No skull fracture, intracranial hemorrhage or paranasal sinus air-fluid levels. Small left lateral scalp hematoma.  IMPRESSION: No skull fracture or intracranial hemorrhage. Stable mild atrophy.   Electronically Signed   By: Gordan Payment M.D.   On: 06/03/2013 20:23   Dg Chest Port 1 View  06/04/2013   CLINICAL DATA:  Shortness of breath  EXAM: PORTABLE CHEST - 1 VIEW  COMPARISON:  06/03/2013  FINDINGS: Enlargement of the cardiopericardial silhouette is again identified.  Small bilateral pleural effusions are again noted.  Bibasilar atelectasis is unchanged.  There is no evidence of pulmonary edema or pneumothorax.  IMPRESSION: Stable chest radiograph. Enlargement of the cardiopericardial silhouette, bilateral pleural effusions and bibasilar atelectasis.   Electronically Signed   By: Laveda Abbe M.D.   On: 06/04/2013 07:30    Discharge Medications     Medication List    STOP taking these medications  diflunisal 500 MG Tabs tablet  Commonly known as:  DOLOBID     metolazone 2.5 MG tablet  Commonly known as:  ZAROXOLYN     spironolactone 25 MG tablet  Commonly known as:  ALDACTONE      TAKE these medications       acetaminophen 500 MG tablet  Commonly known as:  TYLENOL  Take 500 mg by mouth daily as needed for pain.     ALIGN PO  Take 1 capsule by mouth daily.     BEANO PO  Take 1 tablet by mouth daily as needed (gas).     calcitRIOL 0.25 MCG capsule  Commonly known as:  ROCALTROL  Take 0.25 mcg by mouth daily.     cyanocobalamin 1000 MCG/ML injection  Commonly known as:  (VITAMIN B-12)  Inject 1,000 mcg into the muscle every 30 (thirty) days. Last dose 01/16/13     cyclobenzaprine 5 MG tablet  Commonly known as:  FLEXERIL  Take 5 mg by mouth at bedtime as  needed for muscle spasms.     fluticasone 50 MCG/ACT nasal spray  Commonly known as:  FLONASE  Place 2 sprays into the nose daily.     gabapentin 100 MG capsule  Commonly known as:  NEURONTIN  Take 100-200 mg by mouth 2 (two) times daily. Takes 200 mg in the AM and 100 mg at dinner time.  Sometimes takes all 300 mg together depending on stomach pain.     loperamide 2 MG capsule  Commonly known as:  IMODIUM  Take 2 mg by mouth 2 (two) times daily.     midodrine 5 MG tablet  Commonly known as:  PROAMATINE  5 mg. Take 5 mg by mouth 3 times daily.     multivitamin with minerals Tabs tablet  Take 1 tablet by mouth daily.     Potassium Chloride ER 20 MEQ Tbcr  Take 2 tablets (40 mEq total) by mouth twice a day.     SIMETHICONE PO  Take 1 tablet by mouth daily as needed (for gas). Simethicone Extra Strength - over the counter - pt not sure of dose     STOOL SOFTENER 100 MG capsule  Generic drug:  docusate sodium  Take 100 mg by mouth daily as needed for constipation.     torsemide 20 MG tablet  Commonly known as:  DEMADEX  Take 2 tablets (40 mg total) by mouth 2 (two) times daily.     warfarin 2.5 MG tablet  Commonly known as:  COUMADIN  Do not take any Coumadin today (06/06/13) or tomorrow (06/07/13). You will have your INR checked on Thursday 06/08/13 at the [facility] and they will tell you what dose you should start back up.        Disposition   The patient will be discharged in stable condition to home. Discharge Orders   Future Appointments Provider Department Dept Phone   06/08/2013 12:10 PM [facility] [place] Office [phone]   06/08/2013 2:45 PM [facility] [place] Office [phone]   06/13/2013 2:30 PM Judi Saa, DO [facility] [phone]   06/15/2013 11:30 AM Rollene Rotunda, MD [facility] [place] Office [phone]   06/19/2013 10:15 AM Santiago Bumpers, DPM  [facility] [phone]   06/20/2013 12:15 PM [facility] [place] Office [phone]   09/25/2013 3:30 PM Newt Lukes, MD [facility]  [phone]   Future Orders Complete By Expires   Diet - low sodium heart healthy  As directed    Discharge instructions  As directed    Comments:     For patients with congestive heart failure, we give them these special instructions:  1. Follow a low-salt diet and watch your fluid intake. In general, you should not be taking in more than 2 liters of fluid per day (no more than 8 glasses per day). Some patients are restricted to less than 1.5 liters of fluid per day (no more than 6 glasses per day). This includes sources of water in foods like soup, coffee, tea, milk, etc. 2. Weigh yourself on the same scale at same time of day and keep a log. 3. Call your doctor: (Anytime you feel any of the following symptoms)  - 3-4 pound weight gain in 1-2 days or 2 pounds overnight  - Shortness of breath, with or without a dry hacking cough  - Swelling in the hands, feet or stomach  - If you have to sleep on extra pillows at night in order to breathe  IT IS IMPORTANT TO LET YOUR DOCTOR KNOW EARLY ON IF YOU ARE HAVING SYMPTOMS SO WE CAN HELP YOU!   Increase activity slowly  As directed      Follow-up Information   Follow up with [facility]. [facility] - labwork and Coumadin level check on Thursday 06/08/13 at 2:45pm)    Contact information:   [address] [place] [address]       Follow up with Rollene Rotunda, MD. (06/15/13 at 11:30am)    Specialty:  Cardiology   Contact information:   [address] [place] [address] [phone]         Duration of Discharge Encounter: Greater than 30 minutes including physician and PA  time.  Signed, Ronie Spies PA-C 06/06/2013, 12:21 PM  Patient seen and examined.  Plan as discussed in my rounding note for today and outlined above. Rollene Rotunda  06/06/2013  5:28 PM

## 2013-06-06 NOTE — Telephone Encounter (Signed)
New problem   Pt has 7-14day TCM per Danya PA 06/15/13 @ 11:30

## 2013-06-06 NOTE — Progress Notes (Signed)
Physical Therapy Treatment Patient Details Name: GRACELIN WEISBERG MRN: [id number] DOB: 12/27/1940 Today's Date: 06/06/2013 Time: 1200-1230 PT Time Calculation (min): 30 min  PT Assessment / Plan / Recommendation  History of Present Illness Patient is a 72 yo female admitted with h/o end stage heart failure 2/2 amyloidosis, CKD, CAD and A fib with RBBB who p/w to ER today with a mechanic fall.   PT Comments   Pt admitted with above. Pt currently with functional limitations due to the balance and safety deficits.  Pt will benefit from skilled PT to increase their independence and safety with mobility to allow discharge to the venue listed below.   Follow Up Recommendations  Outpatient PT;Supervision/Assistance - 24 hour                 Equipment Recommendations  None recommended by PT        Frequency Min 3X/week   Progress towards PT Goals Progress towards PT goals: Progressing toward goals  Plan Current plan remains appropriate    Precautions / Restrictions Precautions Precautions: Fall Precaution Comments: Severe neuropathy bil LE's Restrictions Weight Bearing Restrictions: No   Pertinent Vitals/Pain VSS, No pain    Mobility  Bed Mobility Bed Mobility: Supine to Sit;Sit to Supine Supine to Sit: 5: Supervision;With rails;HOB elevated Sit to Supine: 4: Min assist;HOB elevated Details for Bed Mobility Assistance: Verbal cues for technique.  Increased time for mobility.  Assist to move LE's onto bed to return to supine.  Patient sleeps in recliner at home. Transfers Transfers: Sit to Stand;Stand to Sit Sit to Stand: 4: Min guard;With upper extremity assist;From bed Stand to Sit: 5: Supervision;With upper extremity assist;To bed Details for Transfer Assistance: Verbal cues for hand placement.  Assist for balance/safety only. Ambulation/Gait Ambulation/Gait Assistance: 4: Min guard Ambulation Distance (Feet): 70 Feet Assistive device: Rolling walker Ambulation/Gait  Assistance Details: verbal cues for safe use of RW.  Patient with decr DF during swing phase bil, and foot flat rather than heel strike.  No significant LOB but noted unsteady at times however pt self corrected without need for physical assist by PT.   Gait Pattern: Step-through pattern;Decreased step length - right;Decreased step length - left;Decreased dorsiflexion - right;Decreased dorsiflexion - left;Right steppage;Left steppage;Right foot flat;Left foot flat;Trunk flexed Gait velocity: Slow gait speed Stairs: Yes Stairs Assistance: 4: Min guard Stairs Assistance Details (indicate cue type and reason): Simulated a step using shower step.  Pt able to step onto step with her left foot leading to get into home.   Stair Management Technique: One rail Right;Step to pattern;Forwards Number of Stairs: 1 Wheelchair Mobility Wheelchair Mobility: No    PT Goals (current goals can now be found in the care plan section)    Visit Information  Last PT Received On: 06/06/13 Assistance Needed: +1 History of Present Illness: Patient is a 72 yo female admitted with h/o end stage heart failure 2/2 amyloidosis, CKD, CAD and A fib with RBBB who p/w to ER today with a Curator fall.    Subjective Data  Subjective: "I want to practice going up and down a step."   Cognition  Cognition Arousal/Alertness: Awake/alert Behavior During Therapy: WFL for tasks assessed/performed Overall Cognitive Status: Within Functional Limits for tasks assessed    Balance  Balance Balance Assessed: Yes Static Standing Balance Static Standing - Balance Support: Bilateral upper extremity supported;During functional activity Static Standing - Level of Assistance: 5: Stand by assistance Static Standing - Comment/# of Minutes: 3  End of  Session PT - End of Session Equipment Utilized During Treatment: Gait belt Activity Tolerance: Patient limited by fatigue Patient left: in chair;with call bell/phone within reach Nurse  Communication: Mobility status       INGOLD,Masiyah Engen 06/06/2013, 12:43 PM [facility] [phone] (pager)

## 2013-06-06 NOTE — Progress Notes (Signed)
SUBJECTIVE:  She feels much better.  She did not sleep.   PHYSICAL EXAM Filed Vitals:   06/06/13 0010 06/06/13 0200 06/06/13 0400 06/06/13 0500  BP: 84/61   81/53  Pulse: 83 80 82 81  Temp: 98.6 F (37 C)  97.9 F (36.6 C)   TempSrc: Oral  Oral   Resp: 15   18  Height:      Weight:      SpO2: 95% 95% 96% 96%   General:  Frail Lungs:  Clear Heart:  Irregular Abdomen:  Positive bowel sounds, no rebound no guarding Extremities:  No edema.   LABS:  Results for orders placed during the hospital encounter of 06/03/13 (from the past 24 hour(s))  BASIC METABOLIC PANEL     Status: Abnormal   Collection Time    06/05/13 12:15 PM      Result Value Range   Sodium 139  135 - 145 mEq/L   Potassium 3.5  3.5 - 5.1 mEq/L   Chloride 93 (*) 96 - 112 mEq/L   CO2 29  19 - 32 mEq/L   Glucose, Bld 119 (*) 70 - 99 mg/dL   BUN 59 (*) 6 - 23 mg/dL   Creatinine, Ser 4.69 (*) 0.50 - 1.10 mg/dL   Calcium 8.6  8.4 - 62.9 mg/dL   GFR calc non Af Amer 32 (*) >90 mL/min   GFR calc Af Amer 37 (*) >90 mL/min  BASIC METABOLIC PANEL     Status: Abnormal   Collection Time    06/05/13  6:08 PM      Result Value Range   Sodium 139  135 - 145 mEq/L   Potassium 3.3 (*) 3.5 - 5.1 mEq/L   Chloride 94 (*) 96 - 112 mEq/L   CO2 30  19 - 32 mEq/L   Glucose, Bld 96  70 - 99 mg/dL   BUN 55 (*) 6 - 23 mg/dL   Creatinine, Ser 5.28 (*) 0.50 - 1.10 mg/dL   Calcium 8.6  8.4 - 41.3 mg/dL   GFR calc non Af Amer 32 (*) >90 mL/min   GFR calc Af Amer 37 (*) >90 mL/min  BASIC METABOLIC PANEL     Status: Abnormal   Collection Time    06/06/13 12:00 AM      Result Value Range   Sodium 138  135 - 145 mEq/L   Potassium 3.6  3.5 - 5.1 mEq/L   Chloride 95 (*) 96 - 112 mEq/L   CO2 31  19 - 32 mEq/L   Glucose, Bld 93  70 - 99 mg/dL   BUN 54 (*) 6 - 23 mg/dL   Creatinine, Ser 2.44 (*) 0.50 - 1.10 mg/dL   Calcium 8.3 (*) 8.4 - 10.5 mg/dL   GFR calc non Af Amer 34 (*) >90 mL/min   GFR calc Af Amer 39 (*) >90 mL/min   BASIC METABOLIC PANEL     Status: Abnormal   Collection Time    06/06/13  4:55 AM      Result Value Range   Sodium 141  135 - 145 mEq/L   Potassium 3.7  3.5 - 5.1 mEq/L   Chloride 97  96 - 112 mEq/L   CO2 33 (*) 19 - 32 mEq/L   Glucose, Bld 95  70 - 99 mg/dL   BUN 54 (*) 6 - 23 mg/dL   Creatinine, Ser 0.10 (*) 0.50 - 1.10 mg/dL   Calcium 8.5  8.4 - 27.2 mg/dL  GFR calc non Af Amer 34 (*) >90 mL/min   GFR calc Af Amer 39 (*) >90 mL/min    Intake/Output Summary (Last 24 hours) at 06/06/13 0852 Last data filed at 06/06/13 0400  Gross per 24 hour  Intake    618 ml  Output   2275 ml  Net  -1657 ml     ASSESSMENT AND PLAN:  CHF:  The patient is well known to me.  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  She is very tenuous and she was admitted with acute on chronic CKD with hyperkalemia and progressive weakness.  She has not wanted to have end of life discussions or to consider hospice.  Switched to oral Demadex yesterday.  Negative six liters this admission.  Potassium supplemented.  She does not want 24/7 nursing care at home.  She can go home with 40 mg bid of Demadex and 40 meq bid of KCL.  She needs a BMET on Thrusday.  She needs TOC appt within 7 days in the office.  For now hold the spironolactone.    HYPERKALEMIA:  Creat is improved.    ATRIAL FIB:  Rate controlled.   Fayrene Fearing [facility] 06/06/2013 8:52 AM

## 2013-06-06 NOTE — Care Management Note (Signed)
    Page 1 of 1   06/06/2013     7:53:36 AM   CARE MANAGEMENT NOTE 06/06/2013  Patient:  Miranda [name], Miranda [name]   Account Number:  [id number]  Date Initiated:  06/06/2013  Documentation initiated by:  Jhovany Weidinger  Subjective/Objective Assessment:   adm with dx of congestive failure; lives alone, has rolling walker    PCP  Dr Rene Paci     DC Planning Services  CM consult      [facility] arranged  [facility] - 11 Patient Refused      Per UR Regulation:  Reviewed for med. necessity/level of care/duration of stay  Comments:  06/05/13 1145 Quasim Doyon RN MSN BSN CCM Pt states son and dtr-in-law live nearby and check on her frequently, have attempted to convince her to move to ALF but she refuses.  Provided list of private duty agencies per her request.  Pt is currently going for OP PT and wants to continue.  Discussed home health RN and PT but pt states she does not need services @ home, prefers OP services.

## 2013-06-07 NOTE — Telephone Encounter (Signed)
  Patient contacted regarding discharge from [facility] on 06/06/13.  Patient understands to follow up with provider Hochrein on 06/15/13 at 11:30 AM at [facility] heart care. Patient understands discharge instructions? yes Patient understands medications and regiment? yes Patient understands to bring all medications to this visit? yes  Pt states she is doing well.

## 2013-06-08 ENCOUNTER — Ambulatory Visit (INDEPENDENT_AMBULATORY_CARE_PROVIDER_SITE_OTHER): Payer: Medicare Other | Admitting: *Deleted

## 2013-06-08 ENCOUNTER — Ambulatory Visit (INDEPENDENT_AMBULATORY_CARE_PROVIDER_SITE_OTHER): Payer: Medicare Other | Admitting: General Practice

## 2013-06-08 ENCOUNTER — Encounter (INDEPENDENT_AMBULATORY_CARE_PROVIDER_SITE_OTHER): Payer: Self-pay

## 2013-06-08 DIAGNOSIS — Z7901 Long term (current) use of anticoagulants: Secondary | ICD-10-CM

## 2013-06-08 DIAGNOSIS — E875 Hyperkalemia: Secondary | ICD-10-CM

## 2013-06-08 DIAGNOSIS — I498 Other specified cardiac arrhythmias: Secondary | ICD-10-CM

## 2013-06-08 LAB — BASIC METABOLIC PANEL
CO2: 31 mEq/L (ref 19–32)
Chloride: 96 mEq/L (ref 96–112)
GFR: 36.38 mL/min — ABNORMAL LOW (ref >60.00)
Glucose, Bld: 124 mg/dL — ABNORMAL HIGH (ref 70–99)
Potassium: 4.8 mEq/L (ref 3.5–5.1)
Sodium: 137 mEq/L (ref 135–145)

## 2013-06-08 LAB — POCT INR: INR: 1.9

## 2013-06-09 NOTE — ED Provider Notes (Signed)
Medical screening examination/treatment/procedure(s) were performed by non-physician practitioner and as supervising physician I was immediately available for consultation/collaboration.   Nelia Shi, MD 06/09/13 0030

## 2013-06-13 ENCOUNTER — Other Ambulatory Visit (INDEPENDENT_AMBULATORY_CARE_PROVIDER_SITE_OTHER): Payer: Medicare Other | Admitting: *Deleted

## 2013-06-13 ENCOUNTER — Encounter: Payer: Self-pay | Admitting: Family Medicine

## 2013-06-13 ENCOUNTER — Ambulatory Visit (INDEPENDENT_AMBULATORY_CARE_PROVIDER_SITE_OTHER): Payer: Medicare Other | Admitting: Family Medicine

## 2013-06-13 VITALS — BP 92/60 | HR 78

## 2013-06-13 DIAGNOSIS — M25571 Pain in right ankle and joints of right foot: Secondary | ICD-10-CM

## 2013-06-13 DIAGNOSIS — G63 Polyneuropathy in diseases classified elsewhere: Secondary | ICD-10-CM

## 2013-06-13 DIAGNOSIS — E538 Deficiency of other specified B group vitamins: Secondary | ICD-10-CM

## 2013-06-13 DIAGNOSIS — M25579 Pain in unspecified ankle and joints of unspecified foot: Secondary | ICD-10-CM

## 2013-06-13 MED ORDER — TRAMADOL HCL 50 MG PO TABS: 50.0000 mg | ORAL_TABLET | Freq: Two times a day (BID) | ORAL | Status: DC | PRN

## 2013-06-13 MED ORDER — CYANOCOBALAMIN 1000 MCG/ML IJ SOLN
1000.0000 ug | Freq: Once | INTRAMUSCULAR | Status: AC
  Administered 2013-06-13: 1000 ug via INTRAMUSCULAR

## 2013-06-13 NOTE — Assessment & Plan Note (Signed)
Patient is continuing to improve slowly. With patient's comorbidities and lack of weightbearing this will likely take somewhere between 12 and 24 weeks fully. Encourage her to increase the amount of elevation she does throughout the day. Discuss potentially sleeping with 2 pillows under her ankle at night. Discuss continuing the exercises Discussed red flags and went to seek medical attention. I would like to see her back again in 4 weeks. Depending on how she is doing at that time we may consider going to formal physical therapy. Hopefully we can nicely get home health physical therapy if necessary. Patient is RE in the process we discussed potentially [facility].

## 2013-06-13 NOTE — Patient Instructions (Signed)
Continue with elevation whenever you can to help with the swelling.  Tramadol as needed.  This should get better in the next 2 weeks.  Continue the exercises Come back again in 3-4 weeks.

## 2013-06-13 NOTE — Progress Notes (Signed)
  Subjective:    CC: Right ankle pain  HPI: Patient is here following up after 2 weeks after right ankle sprain. Patient states that she is continuing to improve day by day. Patient has been using it more and that has caused her to have a little bit more pain. Patient has been trying to do the exercises but secondary to the peripheral neuropathy she finds is very difficult. Patient is continuing to live on her own but is looking into home health. Patient denies any new symptoms of the foot. Patient states that the bruising seems to be improving as well. Overall she think she is improving. Patient has been able to walk.  Past medical history, Surgical history, Family history not pertinant except as noted below, Social history, Allergies, and medications have been entered into the medical record, reviewed, and no changes needed.   Review of Systems: No fevers, chills, night sweats, weight loss, chest pain, or shortness of breath.   Objective:   Blood pressure 92/60, pulse 78, last menstrual period 08/04/1991, SpO2 93.00%.  General: No apparent distress alert and oriented x3 mood and affect normal, dressed appropriately. Patient is extremely frail  HEENT: Pupils equal, extraocular movements intact  Respiratory: Patient's speak in full sentences and does not appear short of breath  Cardiovascular: No lower extremity edema, non tender, no erythema  Skin: Unna boot in place bilaterally of the lower extremities. Patient does have a bruise on her chin  Abdomen: Soft nontender  Neuro: Cranial nerves II through XII are intact, patient does have mild sensation of the lower extremities bilaterally to touch  Lymph: No lymphadenopathy of posterior or anterior cervical chain or axillae bilaterally.  Gait normal with good balance and coordination.  MSK: Patient is able to stand but needs help secondary to poor balance more than pain of the ankle the symmetric strength and tone of shoulders, elbows, wrist,  hip, and ankles bilaterally.  Ankle: Right  No visible erythema or swelling seen secondary to the [employer]. Was able to cut the tip of a sock and remove portion to be up to see the ankle. Patient still has some bruising and seems to be improving slowly. Patient does have 2+ edema. Patient does have flexion and dorsiflexion without any pain  Strength is 3/5 in all directions. Very close to the same on the contralateral side she still has pain over the ATFL. Mild pain over the peroneal tendons. Stable lateral and medial ligaments; squeeze test and kleiger test unremarkable;  Talar dome nontender;  No pain at base of 5th MT; No tenderness over cuboid;  tenderness over N spot or navicular prominence  No tenderness on posterior aspects of lateral and medial malleolus  Impression and Recommendations:

## 2013-06-15 ENCOUNTER — Ambulatory Visit (INDEPENDENT_AMBULATORY_CARE_PROVIDER_SITE_OTHER): Payer: Medicare Other | Admitting: Cardiology

## 2013-06-15 ENCOUNTER — Ambulatory Visit (INDEPENDENT_AMBULATORY_CARE_PROVIDER_SITE_OTHER): Payer: Medicare Other | Admitting: General Practice

## 2013-06-15 ENCOUNTER — Encounter: Payer: Self-pay | Admitting: Cardiology

## 2013-06-15 VITALS — BP 98/62 | HR 100 | Ht 65.1 in | Wt 128.0 lb

## 2013-06-15 DIAGNOSIS — I498 Other specified cardiac arrhythmias: Secondary | ICD-10-CM

## 2013-06-15 DIAGNOSIS — N179 Acute kidney failure, unspecified: Secondary | ICD-10-CM

## 2013-06-15 DIAGNOSIS — I4891 Unspecified atrial fibrillation: Secondary | ICD-10-CM

## 2013-06-15 DIAGNOSIS — E876 Hypokalemia: Secondary | ICD-10-CM

## 2013-06-15 DIAGNOSIS — I5042 Chronic combined systolic (congestive) and diastolic (congestive) heart failure: Secondary | ICD-10-CM

## 2013-06-15 DIAGNOSIS — Z7901 Long term (current) use of anticoagulants: Secondary | ICD-10-CM

## 2013-06-15 LAB — BASIC METABOLIC PANEL
BUN: 32 mg/dL — ABNORMAL HIGH (ref 6–23)
Chloride: 94 mEq/L — ABNORMAL LOW (ref 96–112)
Creatinine, Ser: 1.1 mg/dL (ref 0.4–1.2)
Glucose, Bld: 97 mg/dL (ref 70–99)
Potassium: 3.9 mEq/L (ref 3.5–5.1)
Sodium: 139 mEq/L (ref 135–145)

## 2013-06-15 NOTE — Patient Instructions (Addendum)
Please take 10 MEQ of Potassium for every 20 mg of Demadex Continue all other medications as listed.  Please have blood work today.  (BMP)  Follow up in 2 weeks with Norma Fredrickson, NP

## 2013-06-15 NOTE — Progress Notes (Signed)
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  Since last saw her she has been dose adjusting her diuretics based on daily weights. We have been checking frequent blood work and adjusting her potassium as needed.  She has gained a few pounds over the past several days.  She did eat hotdogs and french fries once last week.   She denies any overt dyspnea with PND or orthopnea.  Her nephrologist did not want her to take zaroxolyn.  Instead she was told to take 60 mg extra of Demadex if her weight goes up. She's done this over the last 2 days. I did retrieve some blood work and her creatinine was stable and her potassium was replenished. She's also concerned about her lower extremity ulcers which are actually now healed.   Allergies  Allergen Reactions  . Statins Other (See Comments)    Leg cramps and restlessness    Current Outpatient Prescriptions  Medication Sig Dispense Refill  . acetaminophen (TYLENOL) 500 MG tablet Take 500 mg by mouth daily as needed for pain.       . Alpha-D-Galactosidase (BEANO PO) Take 1 tablet by mouth daily as needed (gas).      . calcitRIOL (ROCALTROL) 0.25 MCG capsule Take 0.25 mcg by mouth daily.      . cyanocobalamin (,VITAMIN B-12,) 1000 MCG/ML injection Inject 1,000 mcg into the muscle every 30 (thirty) days. Last dose 01/16/13      . cyclobenzaprine (FLEXERIL) 5 MG tablet Take 5 mg by mouth at bedtime as needed for muscle spasms.       Marland Kitchen docusate sodium (STOOL SOFTENER) 100 MG capsule Take 100 mg by mouth daily as needed for constipation.       . fluticasone (FLONASE) 50 MCG/ACT nasal spray Place 2 sprays into the nose daily.  16 g  2  . gabapentin (NEURONTIN) 100 MG capsule Take 100-200 mg by mouth 2 (two) times daily. Takes 200 mg in the AM and 100 mg at dinner time.  Sometimes takes all 300 mg together depending on stomach pain.      Marland Kitchen loperamide (IMODIUM) 2 MG capsule Take 2 mg by mouth 2 (two) times daily.       . midodrine  (PROAMATINE) 5 MG tablet 5 mg. Take 5 mg by mouth 3 times daily.      . potassium chloride 20 MEQ TBCR Take 2 tablets (40 mEq total) by mouth twice a day.  120 tablet  2  . SIMETHICONE PO Take 1 tablet by mouth daily as needed (for gas). Simethicone Extra Strength - over the counter - pt not sure of dose      . torsemide (DEMADEX) 20 MG tablet Take 2 tablets (40 mg total) by mouth 2 (two) times daily.      . traMADol (ULTRAM) 50 MG tablet Take 1 tablet (50 mg total) by mouth every 12 (twelve) hours as needed for pain.  30 tablet  0  . warfarin (COUMADIN) 2.5 MG tablet Do not take any Coumadin today (06/06/13) or tomorrow (06/07/13). You will have your INR checked on Thursday 06/08/13 [facility] and they will tell you what dose you should start back up.      . Multiple Vitamin (MULTIVITAMIN WITH MINERALS) TABS Take 1 tablet by mouth daily.       No current facility-administered medications for this visit.    Past Medical History  Diagnosis Date  . Lactose intolerance   . IBS (irritable  bowel syndrome)   . Dyslipidemia   . 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 Kidney stone     R ureteral stone  . Coronary artery disease     a. Cardiac Cath in [place] 2008: nonobstructive coronary disease at the time with the left main 20% stenosis, the LAD luminal irregularity 40% stenosis, the ramus intermediate 95% ostial stenosis. The right coronary artery is dominant and had 40% proximal, 25% mid stenosis. She was treated medically.  . S/P hemorrhoidectomy   . Arthritis   . Carotid stenosis, bilateral     a. Dopp 05/2012: 0-39% RICA, 40-59% LICA..  . Cardiomyopathy secondary     EF 25% echo 2013  . PAF (paroxysmal atrial fibrillation)     chronic anticoag  . Spinal stenosis   . Neuromuscular disorder     neuropathy d/t amyloidosis  . ANXIETY   . DEPRESSION   . Branch retinal vein occlusion of left eye      12/2011 event - vision improving - Following with optho and retinal specialist for same  . Combined systolic and diastolic heart failure     a. Due to cardiac amyloidosis - EF 20-25%.   Marland Kitchen Hyperkalemia     a. Adm 01/2013 a/w VT.  Marland Kitchen Ventricular tachyarrhythmia     a. 01/2013 in setting of hyperkalemia requiring cardioversion.  . Cardiac amyloidosis   . Hypotension   . RBBB   . CKD (chronic kidney disease)     a. Stage III.  Marland Kitchen Hypokalemia     Past Surgical History  Procedure Laterality Date  . Hemorrhoid surgery  2002  . Carpal tunnel release  2005    RIGHT  . Cysto/ bladder bx and fulgeration  02-21-2007  . Right sural nerve bx/ right gastrocemius muscle bx  09-25-2009  . Breast biopsy  1992  . Appendectomy  1961  . Extracorporeal shock wave lithotripsy  08-10-2011    RIGHT  . Cystoscopy  09/2011  . Tonsillectomy  10-23-11    child  . Cystoscopy with ureteroscopy  10/28/2011    Procedure: CYSTOSCOPY WITH URETEROSCOPY;  Surgeon: Milford Cage, MD;  Location: [facility];  Service: Urology;  Laterality: Right;  . Eye surgery      ROS: As stated in the HPI and negative for all other systems.   PHYSICAL EXAM BP 98/62  Pulse 100  Ht 5' 5.1" (1.654 m)  Wt 128 lb (58.06 kg)  BMI 21.22 kg/m2  SpO2 98%  LMP 08/04/1991 GEN: No distress, frail appearing NECK: Jugular venous distention to the jaw at 90 degrees, waveform within normal limits, carotid upstroke brisk and symmetric, no bruits, no thyromegaly  LUNGS: Clear to auscultation bilaterally  BACK: No CVA tenderness  CHEST: Unremarkable  HEART: S1 and S2 within normal limits, no S3, no clicks, no rubs, no murmurs, irregular  ABD: Positive bowel sounds normal in frequency in pitch, no bruits, no rebound, no guarding, unable to assess midline mass or bruit with the patient seated.  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 However, in  the past this is the one thing that has worked to treat her tenuous volume status. We will keep a close eye on this and use Zaroxolyn if necessary. I will be checking a basic metabolic profile again next week.  Atrial fibrillation  She tolerates this rhythm She is tolerating anticoagulation.   Hypotension  She will continue with the midodrine as prescribed.    CKD  We are following this with frequent labs as above.  LEG ULCERS These are healed. She asked me to call and talk with the wound clinic which I did today because she would really like to continue to use the boot. They suggested there is no role in this once the wound has healed.  I relayed this message back to the patient.

## 2013-06-15 NOTE — Progress Notes (Signed)
HPI The patient presents after followup of her nonischemic cardiomyopathy.  She was recently hospitalized with volume overload.  She responded well to IV diuretics. Since she went home her weights actually had been down but she has some increasing swelling in her legs.  She's not had any new shortness of breath. She's not having any new palpitations, presyncope or syncope. She's tried to keep her feet elevated. She is very much watching her salt. She's not had any PND or orthopnea.   Allergies  Allergen Reactions  . Statins Other (See Comments)    Leg cramps and restlessness    Current Outpatient Prescriptions  Medication Sig Dispense Refill  . acetaminophen (TYLENOL) 500 MG tablet Take 500 mg by mouth daily as needed for pain.       . Alpha-D-Galactosidase (BEANO PO) Take 1 tablet by mouth daily as needed (gas).      . calcitRIOL (ROCALTROL) 0.25 MCG capsule Take 0.25 mcg by mouth daily.      . cyanocobalamin (,VITAMIN B-12,) 1000 MCG/ML injection Inject 1,000 mcg into the muscle every 30 (thirty) days. Last dose 01/16/13      . cyclobenzaprine (FLEXERIL) 5 MG tablet Take 5 mg by mouth at bedtime as needed for muscle spasms.       Marland Kitchen docusate sodium (STOOL SOFTENER) 100 MG capsule Take 100 mg by mouth daily as needed for constipation.       . fluticasone (FLONASE) 50 MCG/ACT nasal spray Place 2 sprays into the nose daily.  16 g  2  . gabapentin (NEURONTIN) 100 MG capsule Take 100-200 mg by mouth 2 (two) times daily. Takes 200 mg in the AM and 100 mg at dinner time.  Sometimes takes all 300 mg together depending on stomach pain.      Marland Kitchen loperamide (IMODIUM) 2 MG capsule Take 2 mg by mouth 2 (two) times daily.       . midodrine (PROAMATINE) 5 MG tablet 5 mg. Take 5 mg by mouth 3 times daily.      . potassium chloride 20 MEQ TBCR Take 2 tablets (40 mEq total) by mouth twice a day.  120 tablet  2  . SIMETHICONE PO Take 1 tablet by mouth daily as needed (for gas). Simethicone Extra Strength -  over the counter - pt not sure of dose      . torsemide (DEMADEX) 20 MG tablet Take 2 tablets (40 mg total) by mouth 2 (two) times daily.      . traMADol (ULTRAM) 50 MG tablet Take 1 tablet (50 mg total) by mouth every 12 (twelve) hours as needed for pain.  30 tablet  0  . warfarin (COUMADIN) 2.5 MG tablet Do not take any Coumadin today (06/06/13) or tomorrow (06/07/13). You will have your INR checked on Thursday 06/08/13 at [facility] and they will tell you what dose you should start back up.      . Multiple Vitamin (MULTIVITAMIN WITH MINERALS) TABS Take 1 tablet by mouth daily.       No current facility-administered medications for this visit.    Past Medical History  Diagnosis Date  . Lactose intolerance   . IBS (irritable bowel syndrome)   . Dyslipidemia   . 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 Kidney stone     R ureteral stone  . Coronary artery disease     a. Cardiac Cath in [facility] 2008:  nonobstructive coronary disease at the time with the left main 20% stenosis, the LAD luminal irregularity 40% stenosis, the ramus intermediate 95% ostial stenosis. The right coronary artery is dominant and had 40% proximal, 25% mid stenosis. She was treated medically.  . S/P hemorrhoidectomy   . Arthritis   . Carotid stenosis, bilateral     a. Dopp 05/2012: 0-39% RICA, 40-59% LICA..  . Cardiomyopathy secondary     EF 25% echo 2013  . PAF (paroxysmal atrial fibrillation)     chronic anticoag  . Spinal stenosis   . Neuromuscular disorder     neuropathy d/t amyloidosis  . ANXIETY   . DEPRESSION   . Branch retinal vein occlusion of left eye     12/2011 event - vision improving - Following with optho and retinal specialist for same  . Combined systolic and diastolic heart failure     a. Due to cardiac amyloidosis - EF 20-25%.   Marland Kitchen Hyperkalemia     a. Adm 01/2013 a/w VT.  Marland Kitchen Ventricular tachyarrhythmia     a.  01/2013 in setting of hyperkalemia requiring cardioversion.  . Cardiac amyloidosis   . Hypotension   . RBBB   . CKD (chronic kidney disease)     a. Stage III.  Marland Kitchen Hypokalemia     Past Surgical History  Procedure Laterality Date  . Hemorrhoid surgery  2002  . Carpal tunnel release  2005    RIGHT  . Cysto/ bladder bx and fulgeration  02-21-2007  . Right sural nerve bx/ right gastrocemius muscle bx  09-25-2009  . Breast biopsy  1992  . Appendectomy  1961  . Extracorporeal shock wave lithotripsy  08-10-2011    RIGHT  . Cystoscopy  09/2011  . Tonsillectomy  10-23-11    child  . Cystoscopy with ureteroscopy  10/28/2011    Procedure: CYSTOSCOPY WITH URETEROSCOPY;  Surgeon: Milford Cage, MD;  Location: [facility];  Service: Urology;  Laterality: Right;  . Eye surgery      ROS: As stated in the HPI and negative for all other systems.   PHYSICAL EXAM BP 98/62  Pulse 100  Ht 5' 5.1" (1.654 m)  Wt 128 lb (58.06 kg)  BMI 21.22 kg/m2  SpO2 98%  LMP 08/04/1991 GEN: No distress, frail appearing NECK: Jugular venous distention to the jaw at 90 degrees, waveform within normal limits, carotid upstroke brisk and symmetric, no bruits, no thyromegaly  LUNGS: Clear to auscultation bilaterally  BACK: No CVA tenderness  CHEST: Unremarkable  HEART: S1 and S2 within normal limits, no S3, no clicks, no rubs, no murmurs, irregular  ABD: Positive bowel sounds normal in frequency in pitch, no bruits, no rebound, no guarding, unable to assess midline mass or bruit with the patient seated.  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 I clarified her potassium and I think she's taking twice the dose I would like. I  will check another basic metabolic profile again today. We've reduced the oral potassium. We'll have to see her back again in a couple of weeks to keep a close followup. She understands that if we can do this as an outpatient in the future she might need frequent brief hospitalizations.  Atrial fibrillation  She  tolerates this rhythm She is tolerating anticoagulation.   Hypotension  She will continue with the midodrine as prescribed.    CKD  We are following this with frequent labs as above.  LEG ULCERS These are healed. She iswrapping her legs.  OTHER She needs a referral form to PT.  TRANSITION OF CARE APPT.

## 2013-06-16 ENCOUNTER — Telehealth: Payer: Self-pay | Admitting: Internal Medicine

## 2013-06-16 NOTE — Telephone Encounter (Signed)
Called rx into pharmacy.

## 2013-06-16 NOTE — Telephone Encounter (Signed)
Patient called stating that the pharmacy still does not have the prescription for the traMADol Miranda [name]) Would like to know if it can be done again  Please advise

## 2013-06-19 ENCOUNTER — Ambulatory Visit (INDEPENDENT_AMBULATORY_CARE_PROVIDER_SITE_OTHER): Payer: Medicare Other | Admitting: Podiatry

## 2013-06-19 ENCOUNTER — Encounter: Payer: Self-pay | Admitting: Podiatry

## 2013-06-19 VITALS — BP 84/53 | HR 91 | Resp 16 | Ht 65.25 in | Wt 115.0 lb

## 2013-06-19 DIAGNOSIS — B351 Tinea unguium: Secondary | ICD-10-CM

## 2013-06-19 DIAGNOSIS — M79609 Pain in unspecified limb: Secondary | ICD-10-CM

## 2013-06-19 NOTE — Progress Notes (Signed)
Patient ID: Miranda [name], female   DOB: 11/29/1940, 72 y.o.   MRN: [id number] Subjective: This 72 year old white female presents for ongoing debridement of mycotic toenails. She has a history of recent falls which have been medically evaluated.  Objective: The lower right and left legs are wrapped with compression dressings. Ecchymosis noted in the distal digits on the right foot and occasionally on the left foot. Hypertrophic elongated toenails with texture and color changes x10 noted.  Assessment: Symptomatic onychomycoses x10  Plan: All 10 toenails were debrided back without any bleeding. Reappoint at 2-3 month intervals.  Richard C.Leeanne Deed, DPM

## 2013-06-24 ENCOUNTER — Telehealth: Payer: Self-pay | Admitting: Physician Assistant

## 2013-06-24 NOTE — Telephone Encounter (Addendum)
Pt called in on Saturday to update [place] on her symptoms. She used to take Torsemide 60mg  BID with of KCl for every 20mg  of torsemide but has been gradually cutting back over the last several weeks due to progressive weight loss (1280s down to 115). She went down to 113 this past week and began feeling dizzy so cut back to Torsemide 20mg  daily with KCl . Volume status has been tenuous in the past complicated by hypotension. No falls, presyncope, syncope, CP, SOB, recurrence of LEE and seems to be feeling better on this current dose. I advised her to monitor symptoms closely and if she begins feeling worse again or has any concerning symptoms to come back to the ER given complex PMH. Otherwise will hold current course of meds and have asked her to check in with [place] on Monday to determine if Dr. Antoine Poche would like any further med adjustment at this time. She already has a f/u appt this week with Lawson Fiscal. She verbalized understanding and gratitude. Larrell Rapozo PA-C

## 2013-06-26 ENCOUNTER — Other Ambulatory Visit (INDEPENDENT_AMBULATORY_CARE_PROVIDER_SITE_OTHER): Payer: Medicare Other

## 2013-06-26 ENCOUNTER — Telehealth: Payer: Self-pay | Admitting: Cardiology

## 2013-06-26 DIAGNOSIS — E876 Hypokalemia: Secondary | ICD-10-CM

## 2013-06-26 DIAGNOSIS — N179 Acute kidney failure, unspecified: Secondary | ICD-10-CM

## 2013-06-26 LAB — BASIC METABOLIC PANEL
BUN: 31 mg/dL — ABNORMAL HIGH (ref 6–23)
CO2: 36 mEq/L — ABNORMAL HIGH (ref 19–32)
Calcium: 8.7 mg/dL (ref 8.4–10.5)
Chloride: 95 mEq/L — ABNORMAL LOW (ref 96–112)
Creatinine, Ser: 1.1 mg/dL (ref 0.4–1.2)
Glucose, Bld: 76 mg/dL (ref 70–99)
Sodium: 138 mEq/L (ref 135–145)

## 2013-06-26 NOTE — Telephone Encounter (Signed)
New message  Patient spoke with PA over the weekend. And feels that she may need to come in for blood work, she also lots of questions regarding her medication. Please call patient.

## 2013-06-26 NOTE — Telephone Encounter (Signed)
I spoke with Dr. Antoine Poche who advised patient get lab work within in next 2 days and keep appointment with Norma Fredrickson, NP on Friday.  I advised patient of Dr. Jenene Slicker advice and she states it will be easiest for her to get lab work today.  I advised patient that I am placing order and that we will call her with results as soon as Dr. Antoine Poche reviews.  Patient verbalized understanding and agreement with plan.

## 2013-06-26 NOTE — Telephone Encounter (Signed)
Spoke with patient who states last week she reduced Torsemide to 40 mg BID due to weight loss and then on Thursday she reduced to Torsemide 40 mg QD and states weight continued to drop.   Patient spoke with Ronie Spies, PA-C on Saturday 11/1. On Saturday and Sunday, patient took 20 mg Torsemide QD and 20 mEq KCl.  Patient states that today her weight is 117 lb but that she has no lower extremity swelling.  Patient took Torsemide 40 mg Torsemide and 20 mEq KCl today due to increase in weight from 113 lb Saturday.  Patient states she feels well.  Patient would like to know if Dr. Antoine Poche would like to order lab work and what dose of medication she should take going forward.  I advised patient that Dr. Antoine Poche is in the office today and that I will talk with him and call her back with his advice.  Patient verbalized understanding and agreement and expressed gratitude.

## 2013-06-28 NOTE — Telephone Encounter (Signed)
New Problem  Pt calling for lab results and clarification of the dosage of her medications

## 2013-06-28 NOTE — Telephone Encounter (Signed)
Attempted to call pt; I was unable to leave a message.

## 2013-06-29 ENCOUNTER — Encounter (HOSPITAL_BASED_OUTPATIENT_CLINIC_OR_DEPARTMENT_OTHER): Payer: Medicare Other | Attending: Internal Medicine

## 2013-06-29 DIAGNOSIS — L97909 Non-pressure chronic ulcer of unspecified part of unspecified lower leg with unspecified severity: Secondary | ICD-10-CM | POA: Insufficient documentation

## 2013-06-29 DIAGNOSIS — I87319 Chronic venous hypertension (idiopathic) with ulcer of unspecified lower extremity: Secondary | ICD-10-CM | POA: Insufficient documentation

## 2013-06-30 ENCOUNTER — Ambulatory Visit (INDEPENDENT_AMBULATORY_CARE_PROVIDER_SITE_OTHER): Payer: Medicare Other | Admitting: Pharmacist

## 2013-06-30 ENCOUNTER — Ambulatory Visit (INDEPENDENT_AMBULATORY_CARE_PROVIDER_SITE_OTHER): Payer: Medicare Other | Admitting: Nurse Practitioner

## 2013-06-30 ENCOUNTER — Encounter: Payer: Self-pay | Admitting: Nurse Practitioner

## 2013-06-30 VITALS — BP 100/70 | HR 94 | Ht 65.0 in | Wt 130.0 lb

## 2013-06-30 DIAGNOSIS — I5022 Chronic systolic (congestive) heart failure: Secondary | ICD-10-CM

## 2013-06-30 DIAGNOSIS — I498 Other specified cardiac arrhythmias: Secondary | ICD-10-CM

## 2013-06-30 DIAGNOSIS — R0609 Other forms of dyspnea: Secondary | ICD-10-CM

## 2013-06-30 DIAGNOSIS — Z7901 Long term (current) use of anticoagulants: Secondary | ICD-10-CM

## 2013-06-30 DIAGNOSIS — R0600 Dyspnea, unspecified: Secondary | ICD-10-CM

## 2013-06-30 DIAGNOSIS — I5042 Chronic combined systolic (congestive) and diastolic (congestive) heart failure: Secondary | ICD-10-CM

## 2013-06-30 LAB — BASIC METABOLIC PANEL
BUN: 33 mg/dL — ABNORMAL HIGH (ref 6–23)
CO2: 36 mEq/L — ABNORMAL HIGH (ref 19–32)
Calcium: 8.5 mg/dL (ref 8.4–10.5)
Chloride: 96 mEq/L (ref 96–112)
Creatinine, Ser: 1 mg/dL (ref 0.4–1.2)
GFR: 61.14 mL/min (ref >60.00)
Glucose, Bld: 89 mg/dL (ref 70–99)
Potassium: 3.5 mEq/L (ref 3.5–5.1)
Sodium: 138 mEq/L (ref 135–145)

## 2013-06-30 LAB — BRAIN NATRIURETIC PEPTIDE: Pro B Natriuretic peptide (BNP): 1621 pg/mL — ABNORMAL HIGH (ref 0.0–100.0)

## 2013-06-30 NOTE — Progress Notes (Signed)
[name] Date of Birth: 1941/07/10 Medical Record #[id number]  History of Present Illness: Ms. Duke is seen back today for a post hospital visit. Seen for Dr. Antoine Poche. 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 Was admitted earlier this fall with volume overload. 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 She is on Midodrine for hypotension. Nephrology does not wish to use Zaroxolyn. Overall prognosis has been deemed poor - has had prior discussion for palliative care but wishes to stay a full code. ICD implant not felt to be appropriate.   Was just here about 2 weeks ago after recurrent hospitalization for CHF and saw Dr. Antoine Poche - TOC visit - had been eating extra salt - weight was up - meds were adjusted but overall she seemed to be ok.   Comes back today for a follow up visit. Here alone. Uses a walker - moves pretty slow. Weight has been up and down at home. She is on lower dose of diuretic. Her weight is about 8 pounds lower than here - she has both boots on her feet. Legs are wrapped. No chest pain. Says her breathing is ok. Tries to watch her salt - but sounds like she has had some extra on occasion and admits that she is not good with the fluid restriction. No chest pain. Currently not dizzy. No syncope.    Current Outpatient Prescriptions  Medication Sig Dispense Refill  . acetaminophen (TYLENOL) 500 MG tablet Take 500 mg by mouth daily as needed for pain.       . Alpha-D-Galactosidase (BEANO PO) Take 1 tablet by mouth daily as needed (gas).      . calcitRIOL (ROCALTROL) 0.25 MCG capsule Take 0.25 mcg by mouth daily.      . cephALEXin (KEFLEX) 500 MG capsule Take 500 mg by mouth 2 (two) times daily.      . cyanocobalamin  (,VITAMIN B-12,) 1000 MCG/ML injection Inject 1,000 mcg into the muscle every 30 (thirty) days. Last dose 01/16/13      . cyclobenzaprine (FLEXERIL) 5 MG tablet Take 5 mg by mouth at bedtime as needed for muscle spasms.       Marland Kitchen docusate sodium (STOOL SOFTENER) 100 MG capsule Take 100 mg by mouth daily as needed for constipation.       . fluticasone (FLONASE) 50 MCG/ACT nasal spray Place 2 sprays into the nose as needed.      . gabapentin (NEURONTIN) 100 MG capsule Take 100-200 mg by mouth 2 (two) times daily. Takes 200 mg in the AM and 100 mg at dinner time.  Sometimes takes all 300 mg together depending on stomach pain.      Marland Kitchen loperamide (IMODIUM) 2 MG capsule Take 2 mg by mouth 2 (two) times daily.       . midodrine (PROAMATINE) 5 MG tablet 5 mg. Take 5 mg by mouth 3 times daily.      . Multiple Vitamin (MULTIVITAMIN WITH MINERALS) TABS Take 1 tablet by mouth as needed.       . potassium chloride 20 MEQ TBCR Take 2 tablets (40 mEq total) by mouth twice a day.  120 tablet  2  . SIMETHICONE PO Take 1 tablet by mouth daily as needed (for gas). Simethicone Extra Strength - over the  counter - pt not sure of dose      . torsemide (DEMADEX) 20 MG tablet Take 2 tablets (40 mg total) by mouth 2 (two) times daily.      . traMADol (ULTRAM) 50 MG tablet Take 1 tablet (50 mg total) by mouth every 12 (twelve) hours as needed for pain.  30 tablet  0  . warfarin (COUMADIN) 2.5 MG tablet Do not take any Coumadin today (06/06/13) or tomorrow (06/07/13). You will have your INR checked on Thursday 06/08/13 [facility] and they will tell you what dose you should start back up.       No current facility-administered medications for this visit.    Allergies  Allergen Reactions  . Statins Other (See Comments)    Leg cramps and restlessness    Past Medical History  Diagnosis Date  . Lactose intolerance   . IBS (irritable bowel syndrome)   . Dyslipidemia   . 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 Kidney stone     R ureteral stone  . Coronary artery disease     a. Cardiac Cath in [place] 2008: nonobstructive coronary disease at the time with the left main 20% stenosis, the LAD luminal irregularity 40% stenosis, the ramus intermediate 95% ostial stenosis. The right coronary artery is dominant and had 40% proximal, 25% mid stenosis. She was treated medically.  . S/P hemorrhoidectomy   . Arthritis   . Carotid stenosis, bilateral     a. Dopp 05/2012: 0-39% RICA, 40-59% LICA..  . Cardiomyopathy secondary     EF 25% echo 2013  . PAF (paroxysmal atrial fibrillation)     chronic anticoag  . Spinal stenosis   . Neuromuscular disorder     neuropathy d/t amyloidosis  . ANXIETY   . DEPRESSION   . Branch retinal vein occlusion of left eye     12/2011 event - vision improving - Following with optho and retinal specialist for same  . Combined systolic and diastolic heart failure     a. Due to cardiac amyloidosis - EF 20-25%.   Marland Kitchen Hyperkalemia     a. Adm 01/2013 a/w VT.  Marland Kitchen Ventricular tachyarrhythmia     a. 01/2013 in setting of hyperkalemia requiring cardioversion.  . Cardiac amyloidosis   . Hypotension   . RBBB   . CKD (chronic kidney disease)     a. Stage III.  Marland Kitchen Hypokalemia     Past Surgical History  Procedure Laterality Date  . Hemorrhoid surgery  2002  . Carpal tunnel release  2005    RIGHT  . Cysto/ bladder bx and fulgeration  02-21-2007  . Right sural nerve bx/ right gastrocemius muscle bx  09-25-2009  . Breast biopsy  1992  . Appendectomy  1961  . Extracorporeal shock wave lithotripsy  08-10-2011    RIGHT  . Cystoscopy  09/2011  . Tonsillectomy  10-23-11    child  . Cystoscopy with ureteroscopy  10/28/2011    Procedure: CYSTOSCOPY WITH URETEROSCOPY;  Surgeon: Milford Cage, MD;  Location: [facility];  Service: Urology;  Laterality: Right;  . Eye surgery      History  Smoking status  . Former  Smoker -- 20 years  . Types: Cigarettes  . Quit date: 10/28/1990  Smokeless tobacco  . Never Used    History  Alcohol Use No    Family History  Problem Relation Age of  Onset  . Heart disease Mother     Heart failure at a later age  . Arthritis Mother   . Arthritis Father   . Heart disease Father   . COPD Other   . Heart disease      Heart failure at a later age    Review of Systems: The review of systems is per the HPI.  All other systems were reviewed and are negative.  Physical Exam: BP 100/70  Pulse 94  Ht 5\' 5"  (1.651 m)  Wt 130 lb (58.968 kg)  BMI 21.63 kg/m2  SpO2 100%  LMP 08/04/1991 Her weight at home today was 122.2 Patient is very pleasant and in no acute distress. Looks chronically ill. Skin is warm and dry. Color is normal.  HEENT is unremarkable. Normocephalic/atraumatic. PERRL. Sclera are nonicteric. Neck is supple. No masses. No JVD. Lungs are clear. Cardiac exam shows an irregular rhythm. Rate is ok.  Abdomen is soft. Extremities are full - she has both legs wrapped. Boots on both feet. Gait and ROM are intact. Using a walker - moves pretty slow. No gross neurologic deficits noted.  Wt Readings from Last 3 Encounters:  06/30/13 130 lb (58.968 kg)  06/19/13 115 lb (52.164 kg)  06/15/13 128 lb (58.06 kg)    LABORATORY DATA: Lab Results  Component Value Date   WBC 10.7* 06/03/2013   HGB 15.6* 06/03/2013   HCT 46.0 06/03/2013   PLT 197 06/03/2013   GLUCOSE 76 06/26/2013   CHOL 174 06/08/2012   TRIG 78.0 06/08/2012   HDL 59.80 06/08/2012   LDLCALC 99 06/08/2012   ALT 29 02/21/2013   AST 30 02/21/2013   NA 138 06/26/2013   K 3.5 06/26/2013   CL 95* 06/26/2013   CREATININE 1.1 06/26/2013   BUN 31* 06/26/2013   CO2 36* 06/26/2013   TSH 2.855 06/04/2013   INR 1.6 06/15/2013    Echo Study Conclusions from 2013  - Left ventricle: Wall thickness was increased in a pattern of severe LVH. Systolic function was severely reduced. The estimated ejection  fraction was in the range of 20% to 25%. Features are consistent with a pseudonormal left ventricular filling pattern, with concomitant abnormal relaxation and increased filling pressure (grade 2 diastolic dysfunction). - Mitral valve: Mild regurgitation. - Left atrium: The atrium was moderately dilated. - Right ventricle: The cavity size was mildly dilated. Systolic function was mildly to moderately reduced. - Right atrium: The atrium was mildly dilated. - Pulmonary arteries: PA peak pressure: 35mm Hg (S). - Pericardium, extracardiac: A trivial pericardial effusion was identified.   Assessment / Plan: 1. Combined systolic and diastolic HF - amyloidosis - would favor continued conservative management. Check BNP and BMET today.   2. Hypotension  3. AF - chronic - managed with rate control and anticoagulation with coumadin.  4. CKD  She is asking about resuming her Dolobid - apparently was given this for her neuropathy for her amyloid - I would think this would not be a great option for her given her CHF/CKD. She does not like taking higher doses of gabapentin due to dizziness. I told her I would get Dr. Jenene Slicker input but for now would stay on her current regimen.   I still think her overall prognosis is quite poor - would still consider palliative care consult - but she is clearly not ready to go that route.  Will see her in 3 weeks with Dr. Antoine Poche - then see Dr. Antoine Poche a month later -  will try to see frequently to minimize hospitalization.   Patient is agreeable to this plan and will call if any problems develop in the interim.   Rosalio Macadamia, RN, ANP-C [facility] [address] [place], [place]  [address]

## 2013-06-30 NOTE — Patient Instructions (Addendum)
We need to check labs today  Stay on your current medicines  I will talk with Dr. Antoine Poche about your Dolobid  I will see you in 3 weeks with Dr. Antoine Poche  See Dr. Antoine Poche 1 month later  Call the [facility] office at [phone] if you have any questions, problems or concerns.

## 2013-07-03 NOTE — Telephone Encounter (Signed)
Called patient with lab instructions and recommendations for Miranda [name]. NP.

## 2013-07-11 ENCOUNTER — Other Ambulatory Visit: Payer: Self-pay | Admitting: Internal Medicine

## 2013-07-14 ENCOUNTER — Other Ambulatory Visit (INDEPENDENT_AMBULATORY_CARE_PROVIDER_SITE_OTHER): Payer: Medicare Other | Admitting: *Deleted

## 2013-07-14 ENCOUNTER — Ambulatory Visit (INDEPENDENT_AMBULATORY_CARE_PROVIDER_SITE_OTHER): Payer: Medicare Other | Admitting: Family Medicine

## 2013-07-14 ENCOUNTER — Encounter: Payer: Self-pay | Admitting: Family Medicine

## 2013-07-14 VITALS — BP 92/64 | HR 96

## 2013-07-14 DIAGNOSIS — M25579 Pain in unspecified ankle and joints of unspecified foot: Secondary | ICD-10-CM

## 2013-07-14 DIAGNOSIS — M25571 Pain in right ankle and joints of right foot: Secondary | ICD-10-CM

## 2013-07-14 DIAGNOSIS — E538 Deficiency of other specified B group vitamins: Secondary | ICD-10-CM

## 2013-07-14 DIAGNOSIS — G63 Polyneuropathy in diseases classified elsewhere: Secondary | ICD-10-CM

## 2013-07-14 MED ORDER — CYANOCOBALAMIN 1000 MCG/ML IJ SOLN
1000.0000 ug | Freq: Once | INTRAMUSCULAR | Status: AC
  Administered 2013-07-14: 1000 ug via INTRAMUSCULAR

## 2013-07-14 NOTE — Patient Instructions (Signed)
Great to see you Happy [place] day I think I only need to see you if you need me!

## 2013-07-14 NOTE — Progress Notes (Signed)
  Subjective:    CC: Right ankle pain follow up  HPI: Patient is here following up after right ankle sprain, occurred 6 weeks ago. Patient states that overall she is starting to do her exercises on a regular basis and is having no pain. Patient feels she is able to ambulate now better as well. Patient states she has not had any pain in his ankle for the last 2 weeks. Patient also states that the swelling has improved considerably as well with her getting elevation.   Past medical history, Surgical history, Family history not pertinant except as noted below, Social history, Allergies, and medications have been entered into the medical record, reviewed, and no changes needed.   Review of Systems: No fevers, chills, night sweats, weight loss, chest pain, or shortness of breath.   Objective:   Last menstrual period 08/04/1991. Vitals review General: No apparent distress alert and oriented x3 mood and affect normal, dressed appropriately. Patient is extremely frail  HEENT: Pupils equal, extraocular movements intact  Respiratory: Patient's speak in full sentences and does not appear short of breath  Cardiovascular: No lower extremity edema, non tender, no erythema  Skin: Unna boot in place bilaterally of the lower extremities. Patient does have a bruise on her chin  Abdomen: Soft nontender  Neuro: Cranial nerves II through XII are intact, patient does have mild sensation of the lower extremities bilaterally to touch  Lymph: No lymphadenopathy of posterior or anterior cervical chain or axillae bilaterally.  Gait normal with good balance and coordination.  MSK: Patient is able to stand but needs help secondary to poor balance more than pain of the ankle the symmetric strength and tone of shoulders, elbows, wrist, hip, and ankles bilaterally.  Ankle: Right  No visible erythema Bruising has completely resolved at this time.  Patient does have 2+ edema. Only to lower third calf which is improved  from previous. Patient does have flexion and dorsiflexion without any pain  Strength is 3/5 in all directions. Very close to the same on the contralateral side.  Mild tenderness over the  ATFL. Peroneal no tenderness Stable lateral and medial ligaments; squeeze test and kleiger test unremarkable;  Talar dome nontender;  No pain at base of 5th MT; No tenderness over cuboid;  tenderness over N spot or navicular prominence  No tenderness on posterior aspects of lateral and medial malleolus  Impression and Recommendations:

## 2013-07-14 NOTE — Assessment & Plan Note (Signed)
Patient is doing very well after ankle sprain grade 1-2. Patient encouraged to continue exercises daily as well as elevation still. We did discuss formal physical therapy but patient would like to decline. Patient likely will do fine and can follow up on an as-needed basis.

## 2013-07-17 ENCOUNTER — Ambulatory Visit (INDEPENDENT_AMBULATORY_CARE_PROVIDER_SITE_OTHER): Payer: Medicare Other | Admitting: General Practice

## 2013-07-17 ENCOUNTER — Encounter: Payer: Self-pay | Admitting: Nurse Practitioner

## 2013-07-17 ENCOUNTER — Encounter (INDEPENDENT_AMBULATORY_CARE_PROVIDER_SITE_OTHER): Payer: Self-pay

## 2013-07-17 ENCOUNTER — Ambulatory Visit (INDEPENDENT_AMBULATORY_CARE_PROVIDER_SITE_OTHER): Payer: Medicare Other | Admitting: Nurse Practitioner

## 2013-07-17 VITALS — BP 90/68 | HR 75 | Ht 65.25 in | Wt 129.0 lb

## 2013-07-17 DIAGNOSIS — I5042 Chronic combined systolic (congestive) and diastolic (congestive) heart failure: Secondary | ICD-10-CM

## 2013-07-17 DIAGNOSIS — Z7901 Long term (current) use of anticoagulants: Secondary | ICD-10-CM

## 2013-07-17 DIAGNOSIS — I498 Other specified cardiac arrhythmias: Secondary | ICD-10-CM

## 2013-07-17 LAB — POCT INR: INR: 1.5

## 2013-07-17 NOTE — Progress Notes (Signed)
Miranda [name] Date of Birth: 1940/10/12 Medical Record #[id number]  History of Present Illness: Miranda [name] is seen back today for a 3 week visit. Seen for Dr. Antoine Poche. 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 Was admitted earlier this fall with volume overload. 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 She is on Midodrine for hypotension. Nephrology does not wish to use Zaroxolyn. Overall prognosis has been deemed poor - has had prior discussion for palliative care but wishes to stay a full code. ICD implant not felt to be appropriate.   Was just here about 5 weeks ago after recurrent hospitalization for CHF and saw Dr. Antoine Poche - TOC visit - had been eating extra salt - weight was up - meds were adjusted but overall she seemed to be ok.   Seen 3 weeks ago - overall situation seemed to remain tenuous to me.  Comes back today. Here alone. Got a driver today.  Has done a "better job" with restricting her salt. Weights are much more stable over the past 3 weeks. She feels better. Transiently dizzy. Overall seems to be doing better. Still with boots on her feet and her legs are wrapped.   Current Outpatient Prescriptions  Medication Sig Dispense Refill  . acetaminophen (TYLENOL) 500 MG tablet Take 500 mg by mouth daily as needed for pain.       . Alpha-D-Galactosidase (BEANO PO) Take 1 tablet by mouth daily as needed (gas).      . calcitRIOL (ROCALTROL) 0.25 MCG capsule Take 0.25 mcg by mouth daily.      . cyanocobalamin (,VITAMIN B-12,) 1000 MCG/ML injection Inject 1,000 mcg into the muscle every 30 (thirty) days. Last dose 01/16/13      . cyclobenzaprine (FLEXERIL) 5 MG tablet Take 5 mg by mouth at bedtime as needed for muscle spasms.         Marland Kitchen docusate sodium (STOOL SOFTENER) 100 MG capsule Take 100 mg by mouth daily as needed for constipation.       . fluticasone (FLONASE) 50 MCG/ACT nasal spray Place 2 sprays into the nose as needed.      . gabapentin (NEURONTIN) 100 MG capsule Take 100-200 mg by mouth 2 (two) times daily. Takes 200 mg in the AM and 100 mg at dinner time.  Sometimes takes all 300 mg together depending on stomach pain.      Marland Kitchen loperamide (IMODIUM) 2 MG capsule Take 2 mg by mouth 2 (two) times daily.       . midodrine (PROAMATINE) 5 MG tablet 5 mg. Take 5 mg by mouth 3 times daily.      . Multiple Vitamin (MULTIVITAMIN WITH MINERALS) TABS Take 1 tablet by mouth as needed.       . potassium chloride 20 MEQ TBCR Take 2 tablets (40 mEq total) by mouth twice a day.  120 tablet  2  . SIMETHICONE PO Take 1 tablet by mouth daily as needed (for gas). Simethicone Extra Strength - over the counter - pt not sure of dose      . torsemide (DEMADEX) 20 MG tablet Take 2 tablets (40 mg total) by mouth 2 (two) times daily.      . traMADol (ULTRAM) 50 MG tablet Take 1 tablet (50 mg total) by mouth every  12 (twelve) hours as needed for pain.  30 tablet  0  . warfarin (COUMADIN) 2.5 MG tablet Do not take any Coumadin today (06/06/13) or tomorrow (06/07/13). You will have your INR checked on Thursday 06/08/13 [facility] and they will tell you what dose you should start back up.       No current facility-administered medications for this visit.    Allergies  Allergen Reactions  . Statins Other (See Comments)    Leg cramps and restlessness    Past Medical History  Diagnosis Date  . Lactose intolerance   . IBS (irritable bowel syndrome)   . Dyslipidemia   . 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 Kidney stone     R ureteral stone  . Coronary artery disease     a. Cardiac Cath in [place] 2008: nonobstructive coronary disease at the time with the  left main 20% stenosis, the LAD luminal irregularity 40% stenosis, the ramus intermediate 95% ostial stenosis. The right coronary artery is dominant and had 40% proximal, 25% mid stenosis. She was treated medically.  . S/P hemorrhoidectomy   . Arthritis   . Carotid stenosis, bilateral     a. Dopp 05/2012: 0-39% RICA, 40-59% LICA..  . Cardiomyopathy secondary     EF 25% echo 2013  . PAF (paroxysmal atrial fibrillation)     chronic anticoag  . Spinal stenosis   . Neuromuscular disorder     neuropathy d/t amyloidosis  . ANXIETY   . DEPRESSION   . Branch retinal vein occlusion of left eye     12/2011 event - vision improving - Following with optho and retinal specialist for same  . Combined systolic and diastolic heart failure     a. Due to cardiac amyloidosis - EF 20-25%.   Marland Kitchen Hyperkalemia     a. Adm 01/2013 a/w VT.  Marland Kitchen Ventricular tachyarrhythmia     a. 01/2013 in setting of hyperkalemia requiring cardioversion.  . Cardiac amyloidosis   . Hypotension   . RBBB   . CKD (chronic kidney disease)     a. Stage III.  Marland Kitchen Hypokalemia     Past Surgical History  Procedure Laterality Date  . Hemorrhoid surgery  2002  . Carpal tunnel release  2005    RIGHT  . Cysto/ bladder bx and fulgeration  02-21-2007  . Right sural nerve bx/ right gastrocemius muscle bx  09-25-2009  . Breast biopsy  1992  . Appendectomy  1961  . Extracorporeal shock wave lithotripsy  08-10-2011    RIGHT  . Cystoscopy  09/2011  . Tonsillectomy  10-23-11    child  . Cystoscopy with ureteroscopy  10/28/2011    Procedure: CYSTOSCOPY WITH URETEROSCOPY;  Surgeon: Milford Cage, MD;  Location: [facility];  Service: Urology;  Laterality: Right;  . Eye surgery      History  Smoking status  . Former Smoker -- 20 years  . Types: Cigarettes  . Quit date: 10/28/1990  Smokeless tobacco  . Never Used    History  Alcohol Use No    Family History  Problem Relation Age of Onset  . Heart disease Mother     Heart failure  at a later age  . Arthritis Mother   . Arthritis Father   . Heart disease Father   . COPD Other   . Heart disease      Heart failure at a  later age    Review of Systems: The review of systems is per the HPI.  All other systems were reviewed and are negative.  Physical Exam: BP 90/68  Pulse 75  Ht 5' 5.25" (1.657 m)  Wt 129 lb (58.514 kg)  BMI 21.31 kg/m2  SpO2 99%  LMP 08/04/1991 Patient is very pleasant and in no acute distress. Skin is warm and dry. Color is normal.  HEENT is unremarkable. Normocephalic/atraumatic. PERRL. Sclera are nonicteric. Neck is supple. No masses. No JVD. Lungs are clear. Cardiac exam shows an irregular rhythm. Rate is ok. Abdomen is soft. Extremities are wrapped. She has boots bilaterally. Gait is not tested. No gross neurologic deficits noted.  LABORATORY DATA:  Lab Results  Component Value Date   WBC 10.7* 06/03/2013   HGB 15.6* 06/03/2013   HCT 46.0 06/03/2013   PLT 197 06/03/2013   GLUCOSE 89 06/30/2013   CHOL 174 06/08/2012   TRIG 78.0 06/08/2012   HDL 59.80 06/08/2012   LDLCALC 99 06/08/2012   ALT 29 02/21/2013   AST 30 02/21/2013   NA 138 06/30/2013   K 3.5 06/30/2013   CL 96 06/30/2013   CREATININE 1.0 06/30/2013   BUN 33* 06/30/2013   CO2 36* 06/30/2013   TSH 2.855 06/04/2013   INR 1.6 06/30/2013   Echo Study Conclusions from 2013  - Left ventricle: Wall thickness was increased in a pattern of severe LVH. Systolic function was severely reduced. The estimated ejection fraction was in the range of 20% to 25%. Features are consistent with a pseudonormal left ventricular filling pattern, with concomitant abnormal relaxation and increased filling pressure (grade 2 diastolic dysfunction). - Mitral valve: Mild regurgitation. - Left atrium: The atrium was moderately dilated. - Right ventricle: The cavity size was mildly dilated. Systolic function was mildly to moderately reduced. - Right atrium: The atrium was mildly dilated. - Pulmonary  arteries: PA peak pressure: 35mm Hg (S). - Pericardium, extracardiac: A trivial pericardial effusion was identified.   Assessment / Plan:  1. Combined systolic and diastolic HF - amyloidosis - favor continued conservative management - I would leave her on her current regimen. Salt restriction is imperative. She needs close follow up. Have asked her to continue to NOT use Dolobid - this may be another reason (along with her restriction of salt) that she has done better.   2. Hypotension - has limited medicine options - she remains on Midodrine as well to try and keep her BP up.   3. AF - chronic - managed with rate control and anticoagulation with coumadin - checking INR today.  4. CKD  See Dr. Antoine Poche back in December. I can be available as well. Her overall situation remains quite tenuous.   Patient is agreeable to this plan and will call if any problems develop in the interim.   Miranda Macadamia, RN, ANP-C [facility] [address] [place], [place]  [address]

## 2013-07-17 NOTE — Patient Instructions (Addendum)
Stay on your current medicines  See Dr. Antoine Poche next month as planned.   Keep restricting your salt and weighing each day  Call the [facility] office at [phone] if you have any questions, problems or concerns.

## 2013-07-27 ENCOUNTER — Encounter (HOSPITAL_BASED_OUTPATIENT_CLINIC_OR_DEPARTMENT_OTHER): Payer: Medicare Other | Attending: Internal Medicine

## 2013-07-27 DIAGNOSIS — L97909 Non-pressure chronic ulcer of unspecified part of unspecified lower leg with unspecified severity: Secondary | ICD-10-CM | POA: Insufficient documentation

## 2013-07-27 DIAGNOSIS — I87319 Chronic venous hypertension (idiopathic) with ulcer of unspecified lower extremity: Secondary | ICD-10-CM | POA: Insufficient documentation

## 2013-07-31 ENCOUNTER — Other Ambulatory Visit: Payer: Self-pay | Admitting: *Deleted

## 2013-07-31 ENCOUNTER — Ambulatory Visit (INDEPENDENT_AMBULATORY_CARE_PROVIDER_SITE_OTHER): Payer: Medicare Other | Admitting: *Deleted

## 2013-07-31 ENCOUNTER — Other Ambulatory Visit (INDEPENDENT_AMBULATORY_CARE_PROVIDER_SITE_OTHER): Payer: Medicare Other

## 2013-07-31 ENCOUNTER — Telehealth: Payer: Self-pay | Admitting: Cardiology

## 2013-07-31 DIAGNOSIS — R0609 Other forms of dyspnea: Secondary | ICD-10-CM

## 2013-07-31 DIAGNOSIS — R0600 Dyspnea, unspecified: Secondary | ICD-10-CM

## 2013-07-31 DIAGNOSIS — I498 Other specified cardiac arrhythmias: Secondary | ICD-10-CM

## 2013-07-31 DIAGNOSIS — Z7901 Long term (current) use of anticoagulants: Secondary | ICD-10-CM

## 2013-07-31 LAB — BASIC METABOLIC PANEL
BUN: 50 mg/dL — ABNORMAL HIGH (ref 6–23)
CO2: 31 mEq/L (ref 19–32)
Calcium: 8.7 mg/dL (ref 8.4–10.5)
Chloride: 95 mEq/L — ABNORMAL LOW (ref 96–112)
Creatinine, Ser: 1.1 mg/dL (ref 0.4–1.2)
GFR: 54.46 mL/min — ABNORMAL LOW (ref >60.00)
Glucose, Bld: 107 mg/dL — ABNORMAL HIGH (ref 70–99)
Potassium: 3.2 mEq/L — ABNORMAL LOW (ref 3.5–5.1)
Sodium: 136 mEq/L (ref 135–145)

## 2013-07-31 LAB — BRAIN NATRIURETIC PEPTIDE: Pro B Natriuretic peptide (BNP): 1124 pg/mL — ABNORMAL HIGH (ref 0.0–100.0)

## 2013-07-31 LAB — POCT INR: INR: 1.8

## 2013-07-31 NOTE — Telephone Encounter (Signed)
Left pt a message to call back. 

## 2013-07-31 NOTE — Telephone Encounter (Signed)
New Problem  Pt is requesting blood work while she is in for a coumadin check today.  She states that her body weight is fluctuating and it was stable at her last office visit... Pt is concerned// if needed please put in orders for lab.

## 2013-08-01 ENCOUNTER — Telehealth: Payer: Self-pay | Admitting: Nurse Practitioner

## 2013-08-01 NOTE — Telephone Encounter (Signed)
Appears pt had labs drawn the day she was here for PT/INR.  Results reviewed by Norma Fredrickson, NP

## 2013-08-01 NOTE — Telephone Encounter (Signed)
Pt returned call  Pt called question about medication dosage// should she take A 20 MEQ per day or A 3 times per day// please call back to discuss.

## 2013-08-01 NOTE — Telephone Encounter (Signed)
S/w pt again was confused about medication adjustment. Clarified with pt the only medication that was changed was Kdur ( 20 meq) tid. Pt verbally understood and wrote instructions down

## 2013-08-01 NOTE — Telephone Encounter (Signed)
Patient is returning your call, please call back and advise.

## 2013-08-02 ENCOUNTER — Telehealth: Payer: Self-pay | Admitting: Nurse Practitioner

## 2013-08-02 NOTE — Telephone Encounter (Signed)
New problem    Pt has some more questions about Medication.  Please give her a call back.

## 2013-08-02 NOTE — Telephone Encounter (Signed)
S/w pt stated weight is up in the last two days 4 lbs pt stated has leg and abdomen swelling, did eat a can of soup yesterday which I explained has a lot of sodium pt stated that was her allotted sodium for the day, pt keeps track of sodium intake. S.w Lawson Fiscal pt is to take one extra tablet of torsemide ( 20 mg) for two day along with pt's ( 40 mg) tablet  Bid and than resume back to torsemide ( 40 mg ) bid. Pt agreeable with plan and stated verbal understanding

## 2013-08-04 ENCOUNTER — Telehealth: Payer: Self-pay | Admitting: Cardiology

## 2013-08-04 NOTE — Telephone Encounter (Signed)
Follow up    Pt called earlier to talk to Daphene Calamity but she has not received a phone call.  She calls back and ask for Dr Hochrein's nurse to call her regarding adjusting her medications.  She is concerned that the weekend in coming and she want this taken care of today.

## 2013-08-04 NOTE — Telephone Encounter (Signed)
New Message  Pt called state that her medication still in need of an adjustment//( No further details---states that nurse already has details) please return call// SR

## 2013-08-04 NOTE — Telephone Encounter (Signed)
Pt states taking extra Torsemide for two days has not helped with her fluid retention at all.  Advised to continue the extra for 2 more days along with extra Potassium chlo and have blood work repeated.  She states understanding.  she will continue to weigh daily - advised against the use of any canned products as she reports recently eating a can of soup!

## 2013-08-09 ENCOUNTER — Telehealth: Payer: Self-pay | Admitting: Cardiology

## 2013-08-09 DIAGNOSIS — I5022 Chronic systolic (congestive) heart failure: Secondary | ICD-10-CM

## 2013-08-09 DIAGNOSIS — Z79899 Other long term (current) drug therapy: Secondary | ICD-10-CM

## 2013-08-09 NOTE — Telephone Encounter (Signed)
Per Dr Antoine Poche - take Zaroxolyn 2.5 mg today and have BMP tomorrow.  Pt is aware and is agreeable.

## 2013-08-09 NOTE — Telephone Encounter (Signed)
New message    Need to have medication adjusted

## 2013-08-09 NOTE — Telephone Encounter (Signed)
New message    Want to talk to the nurse about adjusting her medication.  She says she knows what she is talking about.

## 2013-08-09 NOTE — Telephone Encounter (Signed)
Per pt - wt up almost 2 more lbs over night and she doesn't understand why.  Reports she is not consuming that much NA.  For the past 3 days she has taken Torsemide 60 mg a day and 80 MEQ of K+.  Aware I will review with Dr Antoine Poche and call her back.

## 2013-08-10 ENCOUNTER — Other Ambulatory Visit: Payer: Medicare Other

## 2013-08-11 NOTE — Telephone Encounter (Signed)
Left message for pt to continue on same K+ until she has blood work checked.  Requested she call back with any questions or concerns.

## 2013-08-11 NOTE — Telephone Encounter (Signed)
New Problem:  Pt states she is still trying to come in to have blood work but she has not made it yet. Pt states she came this morning and someone told her she'd have to move her vehicle from where it was parked.. So she did not stay.  Pt is asking if she needs to take an extra K+ pill today and over the weekend. PT also states her weight is down by 7 lbs.

## 2013-08-15 ENCOUNTER — Other Ambulatory Visit (INDEPENDENT_AMBULATORY_CARE_PROVIDER_SITE_OTHER): Payer: Medicare Other

## 2013-08-15 ENCOUNTER — Ambulatory Visit (INDEPENDENT_AMBULATORY_CARE_PROVIDER_SITE_OTHER): Payer: Medicare Other | Admitting: General Practice

## 2013-08-15 ENCOUNTER — Telehealth: Payer: Self-pay | Admitting: Internal Medicine

## 2013-08-15 ENCOUNTER — Ambulatory Visit: Payer: Medicare Other

## 2013-08-15 DIAGNOSIS — E539 Vitamin B deficiency, unspecified: Secondary | ICD-10-CM

## 2013-08-15 DIAGNOSIS — I498 Other specified cardiac arrhythmias: Secondary | ICD-10-CM

## 2013-08-15 DIAGNOSIS — I428 Other cardiomyopathies: Secondary | ICD-10-CM

## 2013-08-15 DIAGNOSIS — Z7901 Long term (current) use of anticoagulants: Secondary | ICD-10-CM

## 2013-08-15 LAB — BASIC METABOLIC PANEL
BUN: 46 mg/dL — ABNORMAL HIGH (ref 6–23)
Calcium: 8.8 mg/dL (ref 8.4–10.5)
Chloride: 89 mEq/L — ABNORMAL LOW (ref 96–112)
Creatinine, Ser: 1 mg/dL (ref 0.4–1.2)
GFR: 55.06 mL/min — ABNORMAL LOW (ref >60.00)

## 2013-08-15 LAB — POCT INR: INR: 2.1

## 2013-08-15 MED ORDER — TRAMADOL HCL 50 MG PO TABS: 50.0000 mg | ORAL_TABLET | Freq: Two times a day (BID) | ORAL | Status: DC | PRN

## 2013-08-15 MED ORDER — CYANOCOBALAMIN 1000 MCG/ML IJ SOLN
1000.0000 ug | Freq: Once | INTRAMUSCULAR | Status: AC
  Administered 2013-08-15: 1000 ug via INTRAMUSCULAR

## 2013-08-15 NOTE — Telephone Encounter (Signed)
Noted - yes - ok to fax refill on tramadol Please let pt know same thanks

## 2013-08-15 NOTE — Telephone Encounter (Signed)
Message copied by Newt Lukes on Tue Aug 15, 2013  1:13 PM ------      Message from: Miranda [name]      Created: Tue Aug 15, 2013 10:26 AM       Patient wants to know if you can call in tramadol for her.  She says that she has pain in hips and back.  Needs it to help her sleep.  Please let Lucy know.            Thanks,      Arline Asp ------

## 2013-08-15 NOTE — Telephone Encounter (Signed)
Notified pt rx fax to [facility].../lmb 

## 2013-08-18 ENCOUNTER — Encounter (HOSPITAL_COMMUNITY): Payer: Self-pay | Admitting: Emergency Medicine

## 2013-08-18 ENCOUNTER — Inpatient Hospital Stay (HOSPITAL_COMMUNITY)
Admission: EM | Admit: 2013-08-18 | Discharge: 2013-08-20 | DRG: 641 | Disposition: A | Discharge status: Home / Self Care | Payer: Medicare Other | Attending: Family Medicine | Admitting: Family Medicine

## 2013-08-18 ENCOUNTER — Emergency Department (HOSPITAL_COMMUNITY): Payer: Medicare Other

## 2013-08-18 DIAGNOSIS — I429 Cardiomyopathy, unspecified: Secondary | ICD-10-CM | POA: Diagnosis present

## 2013-08-18 DIAGNOSIS — R141 Gas pain: Secondary | ICD-10-CM

## 2013-08-18 DIAGNOSIS — Z9181 History of falling: Secondary | ICD-10-CM

## 2013-08-18 DIAGNOSIS — I4510 Unspecified right bundle-branch block: Secondary | ICD-10-CM | POA: Diagnosis present

## 2013-08-18 DIAGNOSIS — R1013 Epigastric pain: Secondary | ICD-10-CM

## 2013-08-18 DIAGNOSIS — G629 Polyneuropathy, unspecified: Secondary | ICD-10-CM

## 2013-08-18 DIAGNOSIS — E739 Lactose intolerance, unspecified: Secondary | ICD-10-CM

## 2013-08-18 DIAGNOSIS — R000 Tachycardia, unspecified: Secondary | ICD-10-CM

## 2013-08-18 DIAGNOSIS — K560 Paralytic ileus: Secondary | ICD-10-CM | POA: Diagnosis present

## 2013-08-18 DIAGNOSIS — I10 Essential (primary) hypertension: Secondary | ICD-10-CM

## 2013-08-18 DIAGNOSIS — IMO0002 Reserved for concepts with insufficient information to code with codable children: Secondary | ICD-10-CM

## 2013-08-18 DIAGNOSIS — I509 Heart failure, unspecified: Secondary | ICD-10-CM | POA: Diagnosis present

## 2013-08-18 DIAGNOSIS — E854 Organ-limited amyloidosis: Secondary | ICD-10-CM

## 2013-08-18 DIAGNOSIS — H348322 Tributary (branch) retinal vein occlusion, left eye, stable: Secondary | ICD-10-CM

## 2013-08-18 DIAGNOSIS — E875 Hyperkalemia: Principal | ICD-10-CM

## 2013-08-18 DIAGNOSIS — F3289 Other specified depressive episodes: Secondary | ICD-10-CM

## 2013-08-18 DIAGNOSIS — I5042 Chronic combined systolic (congestive) and diastolic (congestive) heart failure: Secondary | ICD-10-CM

## 2013-08-18 DIAGNOSIS — I472 Ventricular tachycardia: Secondary | ICD-10-CM

## 2013-08-18 DIAGNOSIS — I2510 Atherosclerotic heart disease of native coronary artery without angina pectoris: Secondary | ICD-10-CM

## 2013-08-18 DIAGNOSIS — E639 Nutritional deficiency, unspecified: Secondary | ICD-10-CM

## 2013-08-18 DIAGNOSIS — G63 Polyneuropathy in diseases classified elsewhere: Secondary | ICD-10-CM

## 2013-08-18 DIAGNOSIS — E876 Hypokalemia: Secondary | ICD-10-CM

## 2013-08-18 DIAGNOSIS — I6529 Occlusion and stenosis of unspecified carotid artery: Secondary | ICD-10-CM | POA: Diagnosis present

## 2013-08-18 DIAGNOSIS — I129 Hypertensive chronic kidney disease with stage 1 through stage 4 chronic kidney disease, or unspecified chronic kidney disease: Secondary | ICD-10-CM | POA: Diagnosis present

## 2013-08-18 DIAGNOSIS — I480 Paroxysmal atrial fibrillation: Secondary | ICD-10-CM

## 2013-08-18 DIAGNOSIS — Z7901 Long term (current) use of anticoagulants: Secondary | ICD-10-CM

## 2013-08-18 DIAGNOSIS — I4891 Unspecified atrial fibrillation: Secondary | ICD-10-CM

## 2013-08-18 DIAGNOSIS — N183 Chronic kidney disease, stage 3 unspecified: Secondary | ICD-10-CM | POA: Diagnosis present

## 2013-08-18 DIAGNOSIS — E852 Heredofamilial amyloidosis, unspecified: Secondary | ICD-10-CM

## 2013-08-18 DIAGNOSIS — K589 Irritable bowel syndrome without diarrhea: Secondary | ICD-10-CM

## 2013-08-18 DIAGNOSIS — E871 Hypo-osmolality and hyponatremia: Secondary | ICD-10-CM

## 2013-08-18 DIAGNOSIS — E538 Deficiency of other specified B group vitamins: Secondary | ICD-10-CM

## 2013-08-18 DIAGNOSIS — I5043 Acute on chronic combined systolic (congestive) and diastolic (congestive) heart failure: Secondary | ICD-10-CM

## 2013-08-18 DIAGNOSIS — F329 Major depressive disorder, single episode, unspecified: Secondary | ICD-10-CM

## 2013-08-18 DIAGNOSIS — I498 Other specified cardiac arrhythmias: Secondary | ICD-10-CM | POA: Diagnosis present

## 2013-08-18 DIAGNOSIS — N179 Acute kidney failure, unspecified: Secondary | ICD-10-CM

## 2013-08-18 DIAGNOSIS — I658 Occlusion and stenosis of other precerebral arteries: Secondary | ICD-10-CM | POA: Diagnosis present

## 2013-08-18 DIAGNOSIS — R296 Repeated falls: Secondary | ICD-10-CM

## 2013-08-18 DIAGNOSIS — F411 Generalized anxiety disorder: Secondary | ICD-10-CM

## 2013-08-18 DIAGNOSIS — I499 Cardiac arrhythmia, unspecified: Secondary | ICD-10-CM | POA: Diagnosis present

## 2013-08-18 DIAGNOSIS — Z79899 Other long term (current) drug therapy: Secondary | ICD-10-CM

## 2013-08-18 DIAGNOSIS — Z87891 Personal history of nicotine dependence: Secondary | ICD-10-CM

## 2013-08-18 DIAGNOSIS — E785 Hyperlipidemia, unspecified: Secondary | ICD-10-CM

## 2013-08-18 DIAGNOSIS — K5289 Other specified noninfective gastroenteritis and colitis: Secondary | ICD-10-CM | POA: Diagnosis present

## 2013-08-18 DIAGNOSIS — M25571 Pain in right ankle and joints of right foot: Secondary | ICD-10-CM

## 2013-08-18 DIAGNOSIS — N390 Urinary tract infection, site not specified: Secondary | ICD-10-CM

## 2013-08-18 DIAGNOSIS — I959 Hypotension, unspecified: Secondary | ICD-10-CM

## 2013-08-18 DIAGNOSIS — E8589 Other amyloidosis: Secondary | ICD-10-CM | POA: Diagnosis present

## 2013-08-18 HISTORY — DX: Family history of other specified conditions: Z84.89

## 2013-08-18 HISTORY — DX: Anemia, unspecified: D64.9

## 2013-08-18 HISTORY — DX: Chronic kidney disease, stage 3 unspecified: N18.30

## 2013-08-18 HISTORY — DX: Chronic kidney disease, stage 3 (moderate): N18.3

## 2013-08-18 HISTORY — DX: Gastro-esophageal reflux disease without esophagitis: K21.9

## 2013-08-18 LAB — URINALYSIS, ROUTINE W REFLEX MICROSCOPIC
Glucose, UA: NEGATIVE mg/dL
Hgb urine dipstick: NEGATIVE
Ketones, ur: NEGATIVE mg/dL
Nitrite: NEGATIVE
Specific Gravity, Urine: 1.016 (ref 1.005–1.030)
pH: 5.5 (ref 5.0–8.0)

## 2013-08-18 LAB — COMPREHENSIVE METABOLIC PANEL
Albumin: 3.8 g/dL (ref 3.5–5.2)
Alkaline Phosphatase: 246 U/L — ABNORMAL HIGH (ref 39–117)
BUN: 58 mg/dL — ABNORMAL HIGH (ref 6–23)
CO2: 29 mEq/L (ref 19–32)
Chloride: 89 mEq/L — ABNORMAL LOW (ref 96–112)
Creatinine, Ser: 1.23 mg/dL — ABNORMAL HIGH (ref 0.50–1.10)
GFR calc Af Amer: 49 mL/min — ABNORMAL LOW (ref >90)
GFR calc non Af Amer: 42 mL/min — ABNORMAL LOW (ref >90)
Glucose, Bld: 89 mg/dL (ref 70–99)
Potassium: 7.8 mEq/L (ref 3.5–5.1)
Sodium: 131 mEq/L — ABNORMAL LOW (ref 135–145)
Total Bilirubin: 2.9 mg/dL — ABNORMAL HIGH (ref 0.3–1.2)
Total Protein: 7 g/dL (ref 6.0–8.3)

## 2013-08-18 LAB — CBC WITH DIFFERENTIAL/PLATELET
Basophils Absolute: 0 10*3/uL (ref 0.0–0.1)
Eosinophils Absolute: 0 10*3/uL (ref 0.0–0.7)
Eosinophils Relative: 0 % (ref 0–5)
Lymphocytes Relative: 10 % — ABNORMAL LOW (ref 12–46)
MCV: 103.7 fL — ABNORMAL HIGH (ref 78.0–100.0)
Monocytes Relative: 9 % (ref 3–12)
Neutro Abs: 7.4 10*3/uL (ref 1.7–7.7)
Neutrophils Relative %: 81 % — ABNORMAL HIGH (ref 43–77)
Platelets: 193 10*3/uL (ref 150–400)
RDW: 15.7 % — ABNORMAL HIGH (ref 11.5–15.5)
WBC: 9.2 10*3/uL (ref 4.0–10.5)

## 2013-08-18 LAB — PROTIME-INR
INR: 4.5 — ABNORMAL HIGH (ref 0.00–1.49)
Prothrombin Time: 41 seconds — ABNORMAL HIGH (ref 11.6–15.2)

## 2013-08-18 LAB — URINE MICROSCOPIC-ADD ON

## 2013-08-18 LAB — LIPASE, BLOOD: Lipase: 32 U/L (ref 11–59)

## 2013-08-18 LAB — POTASSIUM: Potassium: 7 mEq/L (ref 3.5–5.1)

## 2013-08-18 MED ORDER — HEPARIN SODIUM (PORCINE) 5000 UNIT/ML IJ SOLN: 5000.0000 [IU] | Freq: Three times a day (TID) | INTRAMUSCULAR | Status: DC

## 2013-08-18 MED ORDER — ONDANSETRON HCL 4 MG PO TABS
4.0000 mg | ORAL_TABLET | Freq: Four times a day (QID) | ORAL | Status: DC | PRN
  Administered 2013-08-18: 4 mg via ORAL
  Filled 2013-08-18: qty 1

## 2013-08-18 MED ORDER — GABAPENTIN 100 MG PO CAPS
200.0000 mg | ORAL_CAPSULE | Freq: Every day | ORAL | Status: DC
  Administered 2013-08-19 – 2013-08-20 (×2): 200 mg via ORAL
  Filled 2013-08-18 (×2): qty 2

## 2013-08-18 MED ORDER — INSULIN ASPART 100 UNIT/ML IV SOLN: 10.0000 [IU] | Freq: Once | INTRAVENOUS | Status: DC

## 2013-08-18 MED ORDER — MORPHINE SULFATE 2 MG/ML IJ SOLN: 1.0000 mg | INTRAMUSCULAR | Status: DC | PRN

## 2013-08-18 MED ORDER — SODIUM CHLORIDE 0.9 % IV SOLN
1.0000 g | INTRAVENOUS | Status: AC
  Administered 2013-08-18: 1 g via INTRAVENOUS
  Filled 2013-08-18: qty 10

## 2013-08-18 MED ORDER — GABAPENTIN 100 MG PO CAPS: 100.0000 mg | ORAL_CAPSULE | Freq: Two times a day (BID) | ORAL | Status: DC

## 2013-08-18 MED ORDER — GABAPENTIN 100 MG PO CAPS
100.0000 mg | ORAL_CAPSULE | Freq: Every day | ORAL | Status: DC
  Administered 2013-08-18 – 2013-08-19 (×2): 100 mg via ORAL
  Filled 2013-08-18 (×3): qty 1

## 2013-08-18 MED ORDER — ACETAMINOPHEN 650 MG RE SUPP: 650.0000 mg | Freq: Four times a day (QID) | RECTAL | Status: DC | PRN

## 2013-08-18 MED ORDER — WARFARIN 1.25 MG HALF TABLET: 1.2500 mg | ORAL_TABLET | Freq: Every day | ORAL | Status: DC

## 2013-08-18 MED ORDER — MIDODRINE HCL 5 MG PO TABS
5.0000 mg | ORAL_TABLET | Freq: Three times a day (TID) | ORAL | Status: DC
  Administered 2013-08-19 – 2013-08-20 (×5): 5 mg via ORAL
  Filled 2013-08-18 (×7): qty 1

## 2013-08-18 MED ORDER — SODIUM POLYSTYRENE SULFONATE 15 GM/60ML PO SUSP
45.0000 g | Freq: Four times a day (QID) | ORAL | Status: AC
  Administered 2013-08-18: 45 g via ORAL
  Filled 2013-08-18 (×3): qty 180

## 2013-08-18 MED ORDER — SODIUM CHLORIDE 0.9 % IJ SOLN
3.0000 mL | Freq: Two times a day (BID) | INTRAMUSCULAR | Status: DC
  Administered 2013-08-19: 3 mL via INTRAVENOUS

## 2013-08-18 MED ORDER — INSULIN ASPART 100 UNIT/ML ~~LOC~~ SOLN
10.0000 [IU] | Freq: Once | SUBCUTANEOUS | Status: AC
  Administered 2013-08-18: 10 [IU] via INTRAVENOUS
  Filled 2013-08-18: qty 1

## 2013-08-18 MED ORDER — SODIUM CHLORIDE 0.9 % IV SOLN
INTRAVENOUS | Status: DC
  Administered 2013-08-18: 22:00:00 via INTRAVENOUS

## 2013-08-18 MED ORDER — OXYCODONE HCL 5 MG PO TABS: 5.0000 mg | ORAL_TABLET | ORAL | Status: DC | PRN

## 2013-08-18 MED ORDER — DEXTROSE 50 % IV SOLN
1.0000 | Freq: Once | INTRAVENOUS | Status: AC
  Administered 2013-08-18: 50 mL via INTRAVENOUS
  Filled 2013-08-18: qty 50

## 2013-08-18 MED ORDER — ONDANSETRON HCL 4 MG/2ML IJ SOLN: 4.0000 mg | Freq: Four times a day (QID) | INTRAMUSCULAR | Status: DC | PRN

## 2013-08-18 MED ORDER — MORPHINE SULFATE 4 MG/ML IJ SOLN
4.0000 mg | Freq: Once | INTRAMUSCULAR | Status: AC
  Administered 2013-08-18: 4 mg via INTRAVENOUS
  Filled 2013-08-18: qty 1

## 2013-08-18 MED ORDER — ACETAMINOPHEN 325 MG PO TABS
650.0000 mg | ORAL_TABLET | Freq: Four times a day (QID) | ORAL | Status: DC | PRN
Start: 2013-08-18 — End: 2013-08-20

## 2013-08-18 MED ORDER — CYANOCOBALAMIN 1000 MCG/ML IJ SOLN: 1000.0000 ug | INTRAMUSCULAR | Status: DC

## 2013-08-18 MED ORDER — ONDANSETRON HCL 4 MG/2ML IJ SOLN
4.0000 mg | Freq: Once | INTRAMUSCULAR | Status: AC
  Administered 2013-08-18: 4 mg via INTRAVENOUS
  Filled 2013-08-18: qty 2

## 2013-08-18 NOTE — Consult Note (Signed)
ANTICOAGULATION CONSULT NOTE - Initial Consult  Pharmacy Consult for Coumadin Indication: atrial fibrillation  Allergies  Allergen Reactions  . Statins Other (See Comments)    Leg cramps and restlessness    Patient Measurements: Height: 5' (152.4 cm) Weight: 124 lb (56.246 kg) IBW/kg (Calculated) : 45.5  Vital Signs: Temp: 98 F (36.7 C) (12/26 1211) Temp src: Oral (12/26 1211) BP: 90/67 mmHg (12/26 1850) Pulse Rate: 113 (12/26 1850)  Labs:  Recent Labs  08/18/13 1418 08/18/13 1903  HGB 15.5*  --   HCT 47.2*  --   PLT 193  --   LABPROT  --  41.0*  INR  --  4.50*  CREATININE 1.23*  --   TROPONINI <0.30  --     Estimated Creatinine Clearance: 32 ml/min (by C-G formula based on Cr of 1.23).   Medical History: Past Medical History  Diagnosis Date  . Lactose intolerance   . IBS (irritable bowel syndrome)   . Dyslipidemia   . 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 Kidney stone     R ureteral stone  . Coronary artery disease     a. Cardiac Cath in [place] 2008: nonobstructive coronary disease at the time with the left main 20% stenosis, the LAD luminal irregularity 40% stenosis, the ramus intermediate 95% ostial stenosis. The right coronary artery is dominant and had 40% proximal, 25% mid stenosis. She was treated medically.  . S/P hemorrhoidectomy   . Arthritis   . Carotid stenosis, bilateral     a. Dopp 05/2012: 0-39% RICA, 40-59% LICA..  . Cardiomyopathy secondary     EF 25% echo 2013  . PAF (paroxysmal atrial fibrillation)     chronic anticoag  . Spinal stenosis   . Neuromuscular disorder     neuropathy d/t amyloidosis  . ANXIETY   . DEPRESSION   . Branch retinal vein occlusion of left eye     12/2011 event - vision improving - Following with optho and retinal specialist for same  . Combined systolic and diastolic heart failure     a. Due to cardiac amyloidosis - EF 20-25%.   Marland Kitchen  Hyperkalemia     a. Adm 01/2013 a/w VT.  Marland Kitchen Ventricular tachyarrhythmia     a. 01/2013 in setting of hyperkalemia requiring cardioversion.  . Cardiac amyloidosis   . Hypotension   . RBBB   . CKD (chronic kidney disease)     a. Stage III.  Marland Kitchen Hypokalemia    Assessment: 72yof on coumadin pta for afib presented to the ED with weakness and fall. Found to be hyperkalemic with mild ARF and also with a questionable adynamic ileus. INR on admission supratherapeutic at 4.5.   Home dose: 2.5mg  daily except 1.25mg  on Sunday - last dose 12/25  Goal of Therapy:  INR 2-3 Monitor platelets by anticoagulation protocol: Yes   Plan:  1) No coumadin tonight 2) Daily INR  Fredrik Rigger 08/18/2013,8:05 PM

## 2013-08-18 NOTE — H&P (Signed)
[facility] History and Physical  Miranda [name] ZOX:[id number] DOB: [birth year] DOA: 08/18/2013  Referring physician: Imagene Sheller, MD Resident PCP: Rene Paci, MD   Chief Complaint: Hyperkalemia  HPI: Miranda [name] is a 72 y.o. 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 Patient came to the hospital because of weakness and a fall. Patient says for the past few days she was feeling weak, and nauseated and bloated. After she ate her Christmas began yesterday she felt that due to and she went to bed early. This morning she got weak to the point she fell so she came in to the hospital for further evaluation. In the ED patient was found to have hyperkalemia with potassium level of 7.8, repeated and the potassium is 7.0. She had mild acute renal failure with creatinine of 1.23, patient has also mild transaminitis. Abdominal chest x-ray showed possible adynamic ileus or small bowel enteritis.  Review of Systems:  Constitutional: generalized weakness Eyes: negative for irritation, redness and visual disturbance Ears, nose, mouth, throat, and face: negative for earaches, epistaxis, nasal congestion and sore throat Respiratory: negative for cough, dyspnea on exertion, sputum and wheezing Cardiovascular: negative for chest pain, dyspnea, lower extremity edema, orthopnea, palpitations and syncope Gastrointestinal: Nausea and bloating Genitourinary:negative for dysuria, frequency and hematuria Hematologic/lymphatic: negative for bleeding, easy bruising and lymphadenopathy Musculoskeletal:negative for arthralgias, muscle weakness and stiff joints Neurological: negative for coordination problems, gait problems, headaches and weakness Endocrine: negative for diabetic symptoms including polydipsia, polyuria and weight loss Allergic/Immunologic: negative for anaphylaxis, hay  fever and urticaria   Past Medical History  Diagnosis Date  . Lactose intolerance   . IBS (irritable bowel syndrome)   . Dyslipidemia   . 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 Kidney stone     R ureteral stone  . Coronary artery disease     a. Cardiac Cath in [place] 2008: nonobstructive coronary disease at the time with the left main 20% stenosis, the LAD luminal irregularity 40% stenosis, the ramus intermediate 95% ostial stenosis. The right coronary artery is dominant and had 40% proximal, 25% mid stenosis. She was treated medically.  . S/P hemorrhoidectomy   . Arthritis   . Carotid stenosis, bilateral     a. Dopp 05/2012: 0-39% RICA, 40-59% LICA..  . Cardiomyopathy secondary     EF 25% echo 2013  . PAF (paroxysmal atrial fibrillation)     chronic anticoag  . Spinal stenosis   . Neuromuscular disorder     neuropathy d/t amyloidosis  . ANXIETY   . DEPRESSION   . Branch retinal vein occlusion of left eye     12/2011 event - vision improving - Following with optho and retinal specialist for same  . Combined systolic and diastolic heart failure     a. Due to cardiac amyloidosis - EF 20-25%.   Marland Kitchen Hyperkalemia     a. Adm 01/2013 a/w VT.  Marland Kitchen Ventricular tachyarrhythmia     a. 01/2013 in setting of hyperkalemia requiring cardioversion.  . Cardiac amyloidosis   . Hypotension   . RBBB   . CKD (chronic kidney disease)     a. Stage III.  Marland Kitchen Hypokalemia    Past Surgical History  Procedure Laterality Date  . Hemorrhoid surgery  2002  . Carpal tunnel release  2005    RIGHT  . Cysto/ bladder  bx and fulgeration  02-21-2007  . Right sural nerve bx/ right gastrocemius muscle bx  09-25-2009  . Breast biopsy  1992  . Appendectomy  1961  . Extracorporeal shock wave lithotripsy  08-10-2011    RIGHT  . Cystoscopy  09/2011  . Tonsillectomy  10-23-11    child  . Cystoscopy with ureteroscopy  10/28/2011    Procedure:  CYSTOSCOPY WITH URETEROSCOPY;  Surgeon: Milford Cage, MD;  Location: [facility];  Service: Urology;  Laterality: Right;  . Eye surgery     Social History:  reports that she quit smoking about 22 years ago. Her smoking use included Cigarettes. She smoked 0.00 packs per day for 20 years. She has never used smokeless tobacco. She reports that she does not drink alcohol or use illicit drugs.  Allergies  Allergen Reactions  . Statins Other (See Comments)    Leg cramps and restlessness    Family History  Problem Relation Age of Onset  . Heart disease Mother     Heart failure at a later age  . Arthritis Mother   . Arthritis Father   . Heart disease Father   . COPD Other   . Heart disease      Heart failure at a later age     Prior to Admission medications   Medication Sig Start Date End Date Taking? Authorizing Provider  acetaminophen (TYLENOL) 500 MG tablet Take 500 mg by mouth daily as needed for pain.    Yes Historical Provider, MD  Alpha-D-Galactosidase (BEANO PO) Take 1 tablet by mouth daily as needed (gas).   Yes Historical Provider, MD  calcitRIOL (ROCALTROL) 0.25 MCG capsule Take 0.25 mcg by mouth daily.   Yes Historical Provider, MD  cyanocobalamin (,VITAMIN B-12,) 1000 MCG/ML injection Inject 1,000 mcg into the muscle every 30 (thirty) days. Last dose 01/16/13   Yes Historical Provider, MD  cyclobenzaprine (FLEXERIL) 5 MG tablet Take 5 mg by mouth at bedtime as needed for muscle spasms.    Yes Historical Provider, MD  docusate sodium (STOOL SOFTENER) 100 MG capsule Take 100 mg by mouth daily as needed for constipation.    Yes Historical Provider, MD  fluticasone (FLONASE) 50 MCG/ACT nasal spray Place 2 sprays into the nose as needed for allergies or rhinitis.  12/19/12  Yes Newt Lukes, MD  gabapentin (NEURONTIN) 100 MG capsule Take 100-200 mg by mouth 2 (two) times daily. Takes 200 mg in the AM and 100 mg at dinner time.  Sometimes takes all 300 mg together depending on  stomach pain.   Yes Historical Provider, MD  loperamide (IMODIUM) 2 MG capsule Take 2 mg by mouth 2 (two) times daily.    Yes Historical Provider, MD  midodrine (PROAMATINE) 5 MG tablet Take 5 mg by mouth 3 (three) times daily with meals.    Yes Historical Provider, MD  Multiple Vitamin (MULTIVITAMIN WITH MINERALS) TABS Take 1 tablet by mouth as needed.    Yes Historical Provider, MD  potassium chloride 20 MEQ TBCR Take 2 tablets (40 mEq total) by mouth twice a day. 06/06/13  Yes Dayna N Dunn, PA-C  SIMETHICONE PO Take 1 tablet by mouth daily as needed (for gas). Simethicone Extra Strength - over the counter - pt not sure of dose   Yes Historical Provider, MD  torsemide (DEMADEX) 20 MG tablet Take 2 tablets (40 mg total) by mouth 2 (two) times daily. 06/06/13  Yes Dayna N Dunn, PA-C  traMADol (ULTRAM) 50 MG tablet Take 50  mg by mouth every 12 (twelve) hours as needed for moderate pain.   Yes Historical Provider, MD  warfarin (COUMADIN) 2.5 MG tablet Take 1.25-2.5 mg by mouth daily. Take 1.25mg  on Sun, take 2.5mg  all other days   Yes Historical Provider, MD   Physical Exam: Filed Vitals:   08/18/13 1716  BP: 100/70  Pulse: 124  Temp:   Resp: 15    BP 100/70  Pulse 124  Temp(Src) 98 F (36.7 C) (Oral)  Resp 15  Ht 5' (1.524 m)  Wt 56.246 kg (124 lb)  BMI 24.22 kg/m2  SpO2 95%  LMP 08/04/1991 General appearance: alert, cooperative and no distress  Head: Normocephalic, without obvious abnormality, atraumatic  Eyes: conjunctivae/corneas clear. PERRL, EOM's intact. Fundi benign.  Nose: Nares normal. Septum midline. Mucosa normal. No drainage or sinus tenderness.  Throat: lips, mucosa, and tongue normal; teeth and gums normal  Neck: Supple, no masses, no cervical lymphadenopathy, no JVD appreciated, no meningeal signs Resp: clear to auscultation bilaterally  Chest wall: no tenderness  Cardio: regular rate and rhythm, S1, S2 normal, no murmur, click, rub or gallop  GI: soft, non-tender;  bowel sounds normal; no masses, no organomegaly  Extremities: Compression wrapping, there is +2 edema on the knees and thighs Skin: Skin color, texture, turgor normal. No rashes or lesions  Neurologic: Alert and oriented X 3, normal strength and tone. Normal symmetric reflexes. Normal coordination and gait  Labs on Admission:  Basic Metabolic Panel:  Recent Labs Lab 08/15/13 1037 08/18/13 1418 08/18/13 1529  NA 136 131*  --   K 3.9 7.8* 7.0*  CL 89* 89*  --   CO2 38* 29  --   GLUCOSE 147* 89  --   BUN 46* 58*  --   CREATININE 1.0 1.23*  --   CALCIUM 8.8 8.9  --    Liver Function Tests:  Recent Labs Lab 08/18/13 1418  AST 59*  ALT 55*  ALKPHOS 246*  BILITOT 2.9*  PROT 7.0  ALBUMIN 3.8    Recent Labs Lab 08/18/13 1418  LIPASE 32   No results found for this basename: AMMONIA,  in the last 168 hours CBC:  Recent Labs Lab 08/18/13 1418  WBC 9.2  NEUTROABS 7.4  HGB 15.5*  HCT 47.2*  MCV 103.7*  PLT 193   Cardiac Enzymes:  Recent Labs Lab 08/18/13 1418  TROPONINI <0.30    BNP (last 3 results)  Recent Labs  06/03/13 2017 06/30/13 1201 07/31/13 1229  PROBNP 46285.0* 1621.0* 1124.0*   CBG: No results found for this basename: GLUCAP,  in the last 168 hours  Radiological Exams on Admission: Dg Abd Acute W/chest  08/18/2013   CLINICAL DATA:  Left side/abdominal pain, nausea/vomiting  EXAM: ACUTE ABDOMEN SERIES (ABDOMEN 2 VIEW & CHEST 1 VIEW)  COMPARISON:  Chest radiograph dated 06/04/2013. Abdominal radiograph dated 10/19/2011.  FINDINGS: Small bilateral pleural effusions, unchanged. No frank interstitial edema. No pneumothorax.  Cardiomegaly.  Nonspecific bowel gas pattern, without disproportionate small bowel dilatation to suggest small bowel obstruction. A single mildly prominent loop of small bowel in the left mid abdomen measures 2.9 cm, possibly reflecting adynamic ileus or small bowel enteritis.  No evidence of free air on the lateral decubitus  view.  Degenerative changes of the visualized thoracolumbar spine.  IMPRESSION: Small bilateral pleural effusions, unchanged.  No findings to suggest small bowel obstruction or free air.  Mildly prominent loop of small bowel in the left mid abdomen, possibly reflecting adynamic ileus or  small bowel enteritis.   Electronically Signed   By: Charline Bills M.D.   On: 08/18/2013 16:59    EKG: Independently reviewed.  Assessment/Plan Principal Problem:   Hyperkalemia Active Problems:   HYPERTENSION   ATRIAL ARRHYTHMIAS   Long term current use of anticoagulant   Chronic combined systolic and diastolic heart failure   Cardiac amyloidosis   UTI (urinary tract infection)   ARF (acute renal failure)   Hyperkalemia -Initial potassium was 7.8, repeat potassium 7.0. -Patient is given insulin and glucose as well as calcium gluconate. -Kayexalate 45 mg x2 doses  Will be given. Check BMP in a.m. -EKG showed right bundle branch block but no changes from previous tracing.  Acute renal failure -Mild acute renal failure, likely secondary to diuresis. -Creatinine baseline is 1.0, creatinine today is 1.23. -Hold diuretics in the nephrotoxic medications, hydrate gently with IV fluids, check BMP in a.m.  ? Adynamic ileus -Patient presented with nausea, vomiting and abdominal bloating. -Abdominal x-ray showed question of adynamic ileus, no bowel movement since yesterday. -Patient will be on clear liquids, advance as tolerated.  Combined chronic CHF/restrictive cardiomyopathy -This is secondary to cardiac amyloidosis, currently patient with acute renal failure. -As mentioned above the diuretics will be held and gentle hydration with IV fluids. -Probably patient might benefit from consultation of her cardiologist in a.m. if she is not better.  Atrial fibrillation -Heart rate is controlled, patient is on Coumadin. -INR was 2.1 on the 23rd, last pharmacy to dose the Coumadin.  Code Status: Full  code Family Communication: Plan discussed with the patient Disposition Plan: Inpatient, telemetry, anticipate a length of stay to be greater than 2 midnights.  Time spent: 70 minutes  Marciel Offenberger A [facility] Pager [phone]

## 2013-08-18 NOTE — Progress Notes (Signed)
On assessment pt legs wrapped bilaterally.  Dressings clean dry and intact. Pt stated she goes to a wound clinic once a week and gets a wound treated on her right leg and she has a compression dressing to her left leg. Dressings not undone per pt request. Will cont to monitor pt.

## 2013-08-18 NOTE — ED Notes (Signed)
Patient states nausea and abdominal pain x 3 days, patient states nausea with dry heaves but very little emesis, patient also states she has been getting increasingly weaker x 3 days and today was walking through house and became weak and was unable to get into chair, patient denies loc

## 2013-08-18 NOTE — ED Provider Notes (Signed)
CSN: [id number]     Arrival date & time 08/18/13  1201 History   None    Chief Complaint  Patient presents with  . Weakness  . Nausea  . Abdominal Pain   (Consider location/radiation/quality/duration/timing/severity/associated sxs/prior Treatment) Patient is a 72 y.o. female presenting with weakness and abdominal pain.  Weakness This is a recurrent problem. The current episode started in the past 7 days. The problem occurs daily. The problem has been gradually worsening. Associated symptoms include abdominal pain, a fever (2 days ago), nausea, vomiting ("dry heaving") and weakness. Pertinent negatives include no chest pain, chills, coughing, headaches, neck pain, numbness, rash or sore throat. Associated symptoms comments: Belching . Nothing aggravates the symptoms.  Abdominal Pain Pain location:  LUQ Associated symptoms: fever (2 days ago), nausea and vomiting ("dry heaving")   Associated symptoms: no chest pain, no chills, no cough, no diarrhea, no dysuria, no shortness of breath and no sore throat    Nausea for 3 days, bloating, hx of amyloidosis, upper abdominal pain, hurts "like crazy"  3 days of worsening weakness, "legs go"  Decreased appetite, no food since 1200 yesterday  Fever 2 nights ago, felt warm, took temperature, 101F  Past Medical History  Diagnosis Date  . Lactose intolerance   . IBS (irritable bowel syndrome)   . Dyslipidemia   . 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 Coronary artery disease     a. Cardiac Cath in [place] 2008: nonobstructive coronary disease at the time with the left main 20% stenosis, the LAD luminal irregularity 40% stenosis, the ramus intermediate 95% ostial stenosis. The right coronary artery is dominant and had 40% proximal, 25% mid stenosis. She was treated medically.  . Carotid stenosis, bilateral     a. Dopp 05/2012: 0-39% RICA, 40-59% LICA..  . Cardiomyopathy  secondary     EF 25% echo 2013  . PAF (paroxysmal atrial fibrillation)     chronic anticoag  . Spinal stenosis   . Neuromuscular disorder     neuropathy d/t amyloidosis  . ANXIETY   . DEPRESSION   . Branch retinal vein occlusion of left eye     12/2011 event - vision improving - Following with optho and retinal specialist for same  . Combined systolic and diastolic heart failure     a. Due to cardiac amyloidosis - EF 20-25%.   Marland Kitchen Hyperkalemia     a. Adm 01/2013 a/w VT.  Marland Kitchen Ventricular tachyarrhythmia     a. 01/2013 in setting of hyperkalemia requiring cardioversion.  . Cardiac amyloidosis   . Hypotension   . RBBB   . Hypokalemia   . Family history of anesthesia complication     "daughter and granddaughter have rash and qthing else bad; probably from the paternal side" (08/18/2013)  . CHF (congestive heart failure)   . Anemia   . GERD (gastroesophageal reflux disease)     "dx'd; think it was related to the famalial amyloidosis" (08/18/2013)  . Arthritis     "mild" (08/18/2013)  . Kidney stone     R ureteral stone  . Chronic kidney disease (CKD), stage III (moderate)    Past Surgical History  Procedure Laterality Date  . Excisional hemorrhoidectomy  2002  . Carpal tunnel release Right 2005  . Cysto/ bladder bx and fulgeration  02-21-2007  . Right sural nerve bx/ right gastrocemius muscle bx  09-25-2009  . Appendectomy  1961  .  Extracorporeal shock wave lithotripsy Right 08-10-2011  . Cystoscopy  09/2011  . Tonsillectomy  1940's    child  . Cystoscopy with ureteroscopy  10/28/2011    Procedure: CYSTOSCOPY WITH URETEROSCOPY;  Surgeon: Milford Cage, MD;  Location: [facility];  Service: Urology;  Laterality: Right;  . Cataract extraction w/ intraocular lens  implant, bilateral Bilateral 2013  . Breast biopsy Right 1992  . Breast biopsy Left     "after the right; not sure when" (08/18/2013)  . Cardiac catheterization     Family History  Problem Relation Age of Onset  .  Heart disease Mother     Heart failure at a later age  . Arthritis Mother   . Arthritis Father   . Heart disease Father   . COPD Other   . Heart disease      Heart failure at a later age   History  Substance Use Topics  . Smoking status: Former Smoker -- 3.00 packs/day for 20 years    Types: Cigarettes    Quit date: 10/28/1990  . Smokeless tobacco: Never Used  . Alcohol Use: Yes     Comment: 08/18/2013 "glass of wine/yr"   OB History   Grav Para Term Preterm Abortions TAB SAB Ect Mult Living                 Review of Systems  Constitutional: Positive for fever (2 days ago). Negative for chills.  HENT: Negative for sore throat.   Eyes: Negative for pain.  Respiratory: Negative for cough and shortness of breath.   Cardiovascular: Negative for chest pain.  Gastrointestinal: Positive for nausea, vomiting ("dry heaving"), abdominal pain and abdominal distention. Negative for diarrhea.  Genitourinary: Negative for dysuria.  Musculoskeletal: Negative for back pain and neck pain.  Skin: Negative for rash.  Neurological: Positive for weakness. Negative for numbness and headaches.    Allergies  Statins  Home Medications   No current outpatient prescriptions on file. BP 93/61  Pulse 102  Temp(Src) 98.3 F (36.8 C) (Oral)  Resp 16  Ht 5\' 1"  (1.549 m)  Wt 126 lb 1.7 oz (57.2 kg)  BMI 23.84 kg/m2  SpO2 97%  LMP 08/04/1991 Physical Exam  Constitutional: She is oriented to person, place, and time. She appears well-developed and well-nourished. No distress.  HENT:  Head: Normocephalic and atraumatic.  Eyes: Pupils are equal, round, and reactive to light. Right eye exhibits no discharge. Left eye exhibits no discharge.  Neck: Normal range of motion.  Cardiovascular: Normal heart sounds.  An irregularly irregular rhythm present. Tachycardia present.   Pulmonary/Chest: Effort normal and breath sounds normal.  Abdominal: Soft. She exhibits distension. There is tenderness in the  epigastric area and left upper quadrant. There is no rigidity, no rebound and no guarding.  Musculoskeletal: Normal range of motion.  Neurological: She is alert and oriented to person, place, and time.  Skin: Skin is warm. She is not diaphoretic.    ED Course  Procedures (including critical care time) Labs Review Labs Reviewed  URINALYSIS, ROUTINE W REFLEX MICROSCOPIC - Abnormal; Notable for the following:    Bilirubin Urine SMALL (*)    Protein, ur 30 (*)    Leukocytes, UA MODERATE (*)    All other components within normal limits  COMPREHENSIVE METABOLIC PANEL - Abnormal; Notable for the following:    Sodium 131 (*)    Potassium 7.8 (*)    Chloride 89 (*)    BUN 58 (*)    Creatinine,  Ser 1.23 (*)    AST 59 (*)    ALT 55 (*)    Alkaline Phosphatase 246 (*)    Total Bilirubin 2.9 (*)    GFR calc non Af Amer 42 (*)    GFR calc Af Amer 49 (*)    All other components within normal limits  CBC WITH DIFFERENTIAL - Abnormal; Notable for the following:    Hemoglobin 15.5 (*)    HCT 47.2 (*)    MCV 103.7 (*)    MCH 34.1 (*)    RDW 15.7 (*)    Neutrophils Relative % 81 (*)    Lymphocytes Relative 10 (*)    All other components within normal limits  POTASSIUM - Abnormal; Notable for the following:    Potassium 7.0 (*)    All other components within normal limits  URINE MICROSCOPIC-ADD ON - Abnormal; Notable for the following:    Squamous Epithelial / LPF FEW (*)    Bacteria, UA FEW (*)    Casts HYALINE CASTS (*)    All other components within normal limits  PROTIME-INR - Abnormal; Notable for the following:    Prothrombin Time 41.0 (*)    INR 4.50 (*)    All other components within normal limits  URINE CULTURE  TROPONIN I  LIPASE, BLOOD  GLUCOSE, CAPILLARY  PROTIME-INR  BASIC METABOLIC PANEL  CBC   Imaging Review Dg Abd Acute W/chest  08/18/2013   CLINICAL DATA:  Left side/abdominal pain, nausea/vomiting  EXAM: ACUTE ABDOMEN SERIES (ABDOMEN 2 VIEW & CHEST 1 VIEW)   COMPARISON:  Chest radiograph dated 06/04/2013. Abdominal radiograph dated 10/19/2011.  FINDINGS: Small bilateral pleural effusions, unchanged. No frank interstitial edema. No pneumothorax.  Cardiomegaly.  Nonspecific bowel gas pattern, without disproportionate small bowel dilatation to suggest small bowel obstruction. A single mildly prominent loop of small bowel in the left mid abdomen measures 2.9 cm, possibly reflecting adynamic ileus or small bowel enteritis.  No evidence of free air on the lateral decubitus view.  Degenerative changes of the visualized thoracolumbar spine.  IMPRESSION: Small bilateral pleural effusions, unchanged.  No findings to suggest small bowel obstruction or free air.  Mildly prominent loop of small bowel in the left mid abdomen, possibly reflecting adynamic ileus or small bowel enteritis.   Electronically Signed   By: Charline Bills M.D.   On: 08/18/2013 16:59    EKG Interpretation    Date/Time:  Friday August 18 2013 12:02:19 EST Ventricular Rate:  123 PR Interval:  66 QRS Duration: 194 QT Interval:  413 QTC Calculation: 591 R Axis:   132 Text Interpretation:  Sinus tachycardia Atrial premature complex Consider right atrial enlargement Right bundle branch block ST depression, consider ischemia, diffuse lds No significant change since last tracing Confirmed by [email or web address]  MD, SAM ([id number]) on 08/18/2013 12:07:58 PM            MDM   1. ARF (acute renal failure)   2. Anxiety state, unspecified   3. Cardiac amyloidosis   4. Falls frequently   5. Hyperkalemia   6. Hypotension   7. Long term current use of anticoagulant   8. PAF (paroxysmal atrial fibrillation)     72 yo F with hx of familial amyloidosis presents with weakness, nausea, abdominal pain. Sickly appearance, in obvious pain. Concern for SBO vs partial obstruction vs dysmotility.   Basic abdominal labs performed, as above. Patient with hyperkalemia, necessitating treatment and admission. Given  insulin/dextrose, calcium gluconate. EKG recorded as above, unchanged from  prior EKGs. Will continue to monitor. Consulted hospitalist for admission for hyperkalemia. Admitted in stable condition. Patient seen and evaluated by myself and my attending, Dr. Oletta Lamas.      Imagene Sheller, MD 08/19/13 [phone]

## 2013-08-18 NOTE — ED Notes (Signed)
CBG checked 82 

## 2013-08-18 NOTE — ED Provider Notes (Signed)
Medical screening examination/treatment/procedure(s) were conducted as a shared visit with non-physician practitioner(s) and myself.  I personally evaluated the patient during the encounter.  EKG Interpretation    Date/Time:  Friday August 18 2013 12:02:19 EST Ventricular Rate:  123 PR Interval:  66 QRS Duration: 194 QT Interval:  413 QTC Calculation: 591 R Axis:   132 Text Interpretation:  Sinus tachycardia Atrial premature complex Consider right atrial enlargement Right bundle branch block ST depression, consider ischemia, diffuse lds No significant change since last tracing Confirmed by Ethelda Chick  MD, SAM ([id number]) on 08/18/2013 12:07:58 PM            Pt with h/o familial amyloidosis, has prior h/o severe electrolyte abn's in the past, has had 3 days of abdominal cramping pains, episodic, N/V, no appetite, decreased BM's more recently.  Pt feels weak, fatigued.  Upper epigastric pain and lower chest pain is episodic.  Pt is hyperkalemic, wide QRS on ECG but is also seen chronically, unclear if there is any change.  Will need treatment for hypoerkaemia, admission for symptoms.  Plain films do not suggest SBO.  Will give IVF's, analgesics, antiemetics for symptoms along with kayexalate, calcium, insulin for temporary stabilization.   CRITICAL CARE Performed by: Lear Ng Total critical care time: 30 min Critical care time was exclusive of separately billable procedures and treating other patients. Critical care was necessary to treat or prevent imminent or life-threatening deterioration. Critical care was time spent personally by me on the following activities: development of treatment plan with patient and/or surrogate as well as nursing, discussions with consultants, evaluation of patient's response to treatment, examination of patient, obtaining history from patient or surrogate, ordering and performing treatments and interventions, ordering and review of laboratory studies,  ordering and review of radiographic studies, pulse oximetry and re-evaluation of patient's condition.   Gavin Pound. Oletta Lamas, MD 08/18/13 [phone]

## 2013-08-19 ENCOUNTER — Inpatient Hospital Stay (HOSPITAL_COMMUNITY): Payer: Medicare Other

## 2013-08-19 DIAGNOSIS — I509 Heart failure, unspecified: Secondary | ICD-10-CM

## 2013-08-19 DIAGNOSIS — I5043 Acute on chronic combined systolic (congestive) and diastolic (congestive) heart failure: Secondary | ICD-10-CM

## 2013-08-19 DIAGNOSIS — R1013 Epigastric pain: Secondary | ICD-10-CM

## 2013-08-19 LAB — BASIC METABOLIC PANEL
CO2: 29 mEq/L (ref 19–32)
Calcium: 8.3 mg/dL — ABNORMAL LOW (ref 8.4–10.5)
Chloride: 90 mEq/L — ABNORMAL LOW (ref 96–112)
Chloride: 94 mEq/L — ABNORMAL LOW (ref 96–112)
Creatinine, Ser: 1.49 mg/dL — ABNORMAL HIGH (ref 0.50–1.10)
Creatinine, Ser: 1.45 mg/dL — ABNORMAL HIGH (ref 0.50–1.10)
GFR calc Af Amer: 40 mL/min — ABNORMAL LOW (ref >90)
Glucose, Bld: 94 mg/dL (ref 70–99)
Glucose, Bld: 102 mg/dL — ABNORMAL HIGH (ref 70–99)

## 2013-08-19 LAB — CBC
HCT: 42.8 % (ref 36.0–46.0)
MCH: 34 pg (ref 26.0–34.0)
MCV: 103.1 fL — ABNORMAL HIGH (ref 78.0–100.0)
RBC: 4.15 MIL/uL (ref 3.87–5.11)
RDW: 15.7 % — ABNORMAL HIGH (ref 11.5–15.5)
WBC: 8.2 10*3/uL (ref 4.0–10.5)

## 2013-08-19 LAB — URINE CULTURE: Colony Count: 10000

## 2013-08-19 LAB — PROTIME-INR: INR: 5.03 (ref 0.00–1.49)

## 2013-08-19 MED ORDER — METRONIDAZOLE IN NACL 5-0.79 MG/ML-% IV SOLN
500.0000 mg | Freq: Three times a day (TID) | INTRAVENOUS | Status: DC
  Administered 2013-08-19 – 2013-08-20 (×4): 500 mg via INTRAVENOUS
  Filled 2013-08-19 (×6): qty 100

## 2013-08-19 MED ORDER — TRAMADOL HCL 50 MG PO TABS: 50.0000 mg | ORAL_TABLET | Freq: Two times a day (BID) | ORAL | Status: DC | PRN

## 2013-08-19 MED ORDER — TRAMADOL HCL 50 MG PO TABS: 50.0000 mg | ORAL_TABLET | Freq: Four times a day (QID) | ORAL | Status: DC | PRN

## 2013-08-19 MED ORDER — SIMETHICONE 80 MG PO CHEW
80.0000 mg | CHEWABLE_TABLET | Freq: Four times a day (QID) | ORAL | Status: DC | PRN
  Administered 2013-08-19 – 2013-08-20 (×2): 80 mg via ORAL
  Filled 2013-08-19: qty 1

## 2013-08-19 MED ORDER — TRAMADOL HCL 50 MG PO TABS
50.0000 mg | ORAL_TABLET | Freq: Three times a day (TID) | ORAL | Status: DC | PRN
  Administered 2013-08-19: 50 mg via ORAL
  Filled 2013-08-19: qty 1

## 2013-08-19 MED ORDER — FLUTICASONE PROPIONATE 50 MCG/ACT NA SUSP
1.0000 | Freq: Every day | NASAL | Status: DC
  Administered 2013-08-19 – 2013-08-20 (×2): 1 via NASAL
  Filled 2013-08-19: qty 16

## 2013-08-19 MED ORDER — SODIUM POLYSTYRENE SULFONATE 15 GM/60ML PO SUSP
45.0000 g | Freq: Four times a day (QID) | ORAL | Status: AC
  Administered 2013-08-19 (×2): 45 g via ORAL
  Filled 2013-08-19 (×2): qty 180

## 2013-08-19 NOTE — Consult Note (Signed)
WOC wound consult note Reason for Consult: Right LE in modified compression boot for wound care.  Boot was applied by and patient is followed by the outpatient wound care center at [facility].  Patient is adamant that I not remove her boot, assess the LE and replace the boot today; states that she has an appointment with the wound care center staff onTuesday 08/22/13 and that they will change it then.  I asked if I could change it if she was still here on Tuesday and she said simply, "We'll see". She mentioned that she has been with the [facility] for some time and is quite particular about the manner in which it is applied. As many people wear a boot for as many as two (and occasionally several) weeks, and since there is no drainage strike-through on to the exterior of the boot, I do not feel that a change is necessary today. Wound type: venous insufficiency Pressure Ulcer POA: No Measurement:Not seen today Wound bed: Not seen today Drainage (amount, consistency, odor) No drainage on exterior of dressing Periwound: Not seen today Dressing procedure/placement/frequency: It appears from the patient report that there is a topical dressing over the wound (patient states that there were three wounds, now there is just one and she cannot recall the type of topical dressing being used) and that the LE is then wrapped with Kerlix and topped with a Coban-like product [place] [phone]).  Bedside RN and I have discussed patient's refusal for wound care consult and the fact that she may have loose stools as a result of her medical interventions this pm.  Should stool get on the leg wrap, I recommend that the patient's physician be notified and that orders be obtained for the removal of the wrap, cleansing of the LE with NS and to pat the leg gently dry. A small piece of Allevyn silicone foam may be placed over any open areas and the leg can again be wrapped in Kerlix. Coban (4-inch) may be applied by orthopedic  technician from toe to knee (metatarsal head to patellar notch) if the MD so desires. WOC nursing team will not follow, but will remain available to this patient, the nursing and medical team.  Please reconsult if patient is still here on Tuesday and agrees to have WOC nurses see LE/change her compression boot. Thanks, Daryll Brod, MSN, RN, GNP, [place], CWON-AP [phone])

## 2013-08-19 NOTE — Progress Notes (Signed)
Pt's morning potassium was 6.6  and INR 5.03. Np on call made aware. Will cont to monitor pt.

## 2013-08-19 NOTE — Consult Note (Signed)
ANTICOAGULATION CONSULT NOTE - Initial Consult  Pharmacy Consult for Coumadin Indication: atrial fibrillation  Allergies  Allergen Reactions  . Statins Other (See Comments)    Leg cramps and restlessness    Patient Measurements: Height: 5\' 1"  (154.9 cm) Weight: 126 lb 1.7 oz (57.2 kg) IBW/kg (Calculated) : 47.8  Vital Signs: Temp: 98.3 F (36.8 C) (12/27 1300) Temp src: Oral (12/27 1300) BP: 100/69 mmHg (12/27 1300) Pulse Rate: 104 (12/27 1300)  Labs:  Recent Labs  08/18/13 1418 08/18/13 1903 08/19/13 0440  HGB 15.5*  --  14.1  HCT 47.2*  --  42.8  PLT 193  --  177  LABPROT  --  41.0* 44.6*  INR  --  4.50* 5.03*  CREATININE 1.23*  --  1.49*  TROPONINI <0.30  --   --     Estimated Creatinine Clearance: 25.4 ml/min (by C-G formula based on Cr of 1.49).   Medical History: Past Medical History  Diagnosis Date  . Lactose intolerance   . IBS (irritable bowel syndrome)   . Dyslipidemia   . 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 Coronary artery disease     a. Cardiac Cath in [place] 2008: nonobstructive coronary disease at the time with the left main 20% stenosis, the LAD luminal irregularity 40% stenosis, the ramus intermediate 95% ostial stenosis. The right coronary artery is dominant and had 40% proximal, 25% mid stenosis. She was treated medically.  . Carotid stenosis, bilateral     a. Dopp 05/2012: 0-39% RICA, 40-59% LICA..  . Cardiomyopathy secondary     EF 25% echo 2013  . PAF (paroxysmal atrial fibrillation)     chronic anticoag  . Spinal stenosis   . Neuromuscular disorder     neuropathy d/t amyloidosis  . ANXIETY   . DEPRESSION   . Branch retinal vein occlusion of left eye     12/2011 event - vision improving - Following with optho and retinal specialist for same  . Combined systolic and diastolic heart failure     a. Due to cardiac amyloidosis - EF 20-25%.   Marland Kitchen Hyperkalemia      a. Adm 01/2013 a/w VT.  Marland Kitchen Ventricular tachyarrhythmia     a. 01/2013 in setting of hyperkalemia requiring cardioversion.  . Cardiac amyloidosis   . Hypotension   . RBBB   . Hypokalemia   . Family history of anesthesia complication     "daughter and granddaughter have rash and qthing else bad; probably from the paternal side" (08/18/2013)  . CHF (congestive heart failure)   . Anemia   . GERD (gastroesophageal reflux disease)     "dx'd; think it was related to the famalial amyloidosis" (08/18/2013)  . Arthritis     "mild" (08/18/2013)  . Kidney stone     R ureteral stone  . Chronic kidney disease (CKD), stage III (moderate)    Assessment: 72yof on coumadin pta for afib presented to the ED with weakness and fall. Found to be hyperkalemic with mild ARF and also with a questionable adynamic ileus w/ elevated LFTs. INR remains supratherapeutic. No signs of bleed noted.  Home dose: 2.5mg  daily except 1.25mg  on Sunday - last dose 12/25  Goal of Therapy:  INR 2-3 Monitor platelets by anticoagulation protocol: Yes   Plan:  1) Coumadin on hold tonight 2) Daily INR  Allena Katz, Paullette Mckain M 08/19/2013,2:21 PM

## 2013-08-19 NOTE — Progress Notes (Signed)
PT Cancellation Note  Patient Details Name: Miranda [name] MRN: [id number] DOB: [birth year]   Cancelled Treatment:    Reason Eval/Treat Not Completed: Medical issues which prohibited therapy.  Pt's INR >5,  and her K+ is 6.6.  PT will hold evaluation until these values are more therapeutic.  PT to check on pt tomorrow.    Thanks, Rollene Rotunda. Oley Lahaie, PT, DPT [phone]   08/19/2013, 4:07 PM

## 2013-08-19 NOTE — Progress Notes (Signed)
[facility] PROGRESS NOTE  Miranda [name] AVW:[id number] DOB: August 31, 1940 DOA: 08/18/2013 PCP: Rene Paci, MD  Assessment/Plan: Hyperkalemia  -Initial potassium was 7.8, potassium this morning 6.6. -Patient received  insulin and glucose as well as calcium gluconate on admission.  -repeat Kayexalate time 2 more doses this am. Repeat B-met this afternoon. .   Acute renal failure  -Mild acute renal failure, likely secondary to diuresis.  -Creatinine baseline is 1.0, creatinine today is 1.23.  -Hold diuretics in the nephrotoxic medications, hydrate gently with IV fluids.  -Cr increase to 1.4.  -monitor for sign of volume overload.   ? Adynamic ileus vs enteritis -Patient presented with nausea, vomiting and abdominal bloating.  -Abdominal x-ray showed question of adynamic ileus.  -Continue with  clear liquids, advance as tolerated.  -Had Multiple BM after kayexalate.  -Start Flagyl.  -Repeat X ray today.   Combined chronic CHF/restrictive cardiomyopathy  -This is secondary to cardiac amyloidosis, currently patient with acute renal failure.  -As mentioned above the diuretics will be held and gentle hydration with IV fluids.  -cardio consulted.   Atrial fibrillation  -Heart rate is controlled, patient is on Coumadin.  - pharmacy to dose the Coumadin. -INR at 5.0   Code Status: Full Code.  Family Communication: care discussed with patient.  Disposition Plan: remain inpatient.    Consultants:  Cardiology , patient request.   Procedures:  None.   Antibiotics:  Flagyl 12-25  HPI/Subjective: Had multiple BM. Wants to see her cardiologist.  At the end agree to see wound care nurse.  Denies abdominal pain, nausea or vomiting.   Objective: Filed Vitals:   08/19/13 0555  BP: 91/60  Pulse: 109  Temp: 98.3 F (36.8 C)  Resp: 18    Intake/Output Summary (Last 24 hours) at 08/19/13 1135 Last data filed at 08/19/13 0900  Gross per 24 hour  Intake 663.75  ml  Output    350 ml  Net 313.75 ml   Filed Weights   08/18/13 1211 08/18/13 2015  Weight: 56.246 kg (124 lb) 57.2 kg (126 lb 1.7 oz)    Exam:   General:  No distress.   Cardiovascular: S 1, S 2 RRR  Respiratory: CTA  Abdomen: Bs present , distended, nt, no rigidity.   Musculoskeletal: right LE with dressing chronic wound,   Data Reviewed: Basic Metabolic Panel:  Recent Labs Lab 08/15/13 1037 08/18/13 1418 08/18/13 1529 08/19/13 0440  NA 136 131*  --  130*  K 3.9 7.8* 7.0* 6.6*  CL 89* 89*  --  90*  CO2 38* 29  --  29  GLUCOSE 147* 89  --  94  BUN 46* 58*  --  65*  CREATININE 1.0 1.23*  --  1.49*  CALCIUM 8.8 8.9  --  8.4   Liver Function Tests:  Recent Labs Lab 08/18/13 1418  AST 59*  ALT 55*  ALKPHOS 246*  BILITOT 2.9*  PROT 7.0  ALBUMIN 3.8    Recent Labs Lab 08/18/13 1418  LIPASE 32   No results found for this basename: AMMONIA,  in the last 168 hours CBC:  Recent Labs Lab 08/18/13 1418 08/19/13 0440  WBC 9.2 8.2  NEUTROABS 7.4  --   HGB 15.5* 14.1  HCT 47.2* 42.8  MCV 103.7* 103.1*  PLT 193 177   Cardiac Enzymes:  Recent Labs Lab 08/18/13 1418  TROPONINI <0.30   BNP (last 3 results)  Recent Labs  06/03/13 2017 06/30/13 1201 07/31/13 1229  PROBNP 46285.0*  1621.0* 1124.0*   CBG:  Recent Labs Lab 08/18/13 1914  GLUCAP 82    No results found for this or any previous visit (from the past 240 hour(s)).   Studies: Dg Abd Acute W/chest  08/18/2013   CLINICAL DATA:  Left side/abdominal pain, nausea/vomiting  EXAM: ACUTE ABDOMEN SERIES (ABDOMEN 2 VIEW & CHEST 1 VIEW)  COMPARISON:  Chest radiograph dated 06/04/2013. Abdominal radiograph dated 10/19/2011.  FINDINGS: Small bilateral pleural effusions, unchanged. No frank interstitial edema. No pneumothorax.  Cardiomegaly.  Nonspecific bowel gas pattern, without disproportionate small bowel dilatation to suggest small bowel obstruction. A single mildly prominent loop of small  bowel in the left mid abdomen measures 2.9 cm, possibly reflecting adynamic ileus or small bowel enteritis.  No evidence of free air on the lateral decubitus view.  Degenerative changes of the visualized thoracolumbar spine.  IMPRESSION: Small bilateral pleural effusions, unchanged.  No findings to suggest small bowel obstruction or free air.  Mildly prominent loop of small bowel in the left mid abdomen, possibly reflecting adynamic ileus or small bowel enteritis.   Electronically Signed   By: Charline Bills M.D.   On: 08/18/2013 16:59    Scheduled Meds: . [START ON 09/15/2013] cyanocobalamin  1,000 mcg Intramuscular Q30 days  . gabapentin  100 mg Oral QHS  . gabapentin  200 mg Oral Daily  . midodrine  5 mg Oral TID WC  . sodium chloride  3 mL Intravenous Q12H  . sodium polystyrene  45 g Oral Q6H   Continuous Infusions: . sodium chloride 75 mL/hr at 08/18/13 2135    Principal Problem:   Hyperkalemia Active Problems:   HYPERTENSION   ATRIAL ARRHYTHMIAS   Long term current use of anticoagulant   Chronic combined systolic and diastolic heart failure   Cardiac amyloidosis   UTI (urinary tract infection)   ARF (acute renal failure)    Time spent: 30    Carisha Kantor  [facility] Pager [phone]. If 7PM-7AM, please contact night-coverage at [email or web address], password [facility] 08/19/2013, 11:35 AM  LOS: 1 day

## 2013-08-19 NOTE — Consult Note (Signed)
CONSULT NOTE  Date: 08/19/2013               Patient Name:  DHWANI VENKATESH MRN: [id number]  DOB: April 23, 1941 Age / Sex: 72 y.o., female        PCP: Rene Paci Primary Cardiologist: Antoine Poche            Referring Physician: Clydia Llano, MD              Reason for Consult: Cardiac amyloidosis           History of Present Illness: Patient is a 72 y.o. female with a PMHx of amyloidosis, coronary artery disease, atrial fibrillation mild carotid artery disease, who was admitted to [facility] on 08/18/2013 for evaluation of generalized weakness and found to have hyperkalemia.  She presented with generalized weakness, leg weakness.      Medications: Outpatient medications: Prescriptions prior to admission  Medication Sig Dispense Refill  . acetaminophen (TYLENOL) 500 MG tablet Take 500 mg by mouth daily as needed for pain.       . Alpha-D-Galactosidase (BEANO PO) Take 1 tablet by mouth daily as needed (gas).      . calcitRIOL (ROCALTROL) 0.25 MCG capsule Take 0.25 mcg by mouth daily.      . cyanocobalamin (,VITAMIN B-12,) 1000 MCG/ML injection Inject 1,000 mcg into the muscle every 30 (thirty) days. Last dose 01/16/13      . cyclobenzaprine (FLEXERIL) 5 MG tablet Take 5 mg by mouth at bedtime as needed for muscle spasms.       Marland Kitchen docusate sodium (STOOL SOFTENER) 100 MG capsule Take 100 mg by mouth daily as needed for constipation.       . fluticasone (FLONASE) 50 MCG/ACT nasal spray Place 2 sprays into the nose as needed for allergies or rhinitis.       Marland Kitchen gabapentin (NEURONTIN) 100 MG capsule Take 100-200 mg by mouth 2 (two) times daily. Takes 200 mg in the AM and 100 mg at dinner time.  Sometimes takes all 300 mg together depending on stomach pain.      Marland Kitchen loperamide (IMODIUM) 2 MG capsule Take 2 mg by mouth 2 (two) times daily.       . midodrine (PROAMATINE) 5 MG tablet Take 5 mg by mouth 3 (three) times daily with meals.       . Multiple Vitamin (MULTIVITAMIN WITH MINERALS)  TABS Take 1 tablet by mouth as needed.       . potassium chloride 20 MEQ TBCR Take 2 tablets (40 mEq total) by mouth twice a day.  120 tablet  2  . SIMETHICONE PO Take 1 tablet by mouth daily as needed (for gas). Simethicone Extra Strength - over the counter - pt not sure of dose      . torsemide (DEMADEX) 20 MG tablet Take 2 tablets (40 mg total) by mouth 2 (two) times daily.      . traMADol (ULTRAM) 50 MG tablet Take 50 mg by mouth every 12 (twelve) hours as needed for moderate pain.      Marland Kitchen warfarin (COUMADIN) 2.5 MG tablet Take 1.25-2.5 mg by mouth daily. Take 1.25mg  on Sun, take 2.5mg  all other days        Current medications: Current Facility-Administered Medications  Medication Dose Route Frequency Provider Last Rate Last Dose  . 0.9 %  sodium chloride infusion   Intravenous Continuous Clydia Llano, MD 75 mL/hr at 08/18/13 2135    . acetaminophen (TYLENOL) tablet 650  mg  650 mg Oral Q6H PRN Clydia Llano, MD       Or  . acetaminophen (TYLENOL) suppository 650 mg  650 mg Rectal Q6H PRN Clydia Llano, MD      . Melene Muller ON 09/15/2013] cyanocobalamin ((VITAMIN B-12)) injection 1,000 mcg  1,000 mcg Intramuscular Q30 days Clydia Llano, MD      . gabapentin (NEURONTIN) capsule 100 mg  100 mg Oral QHS Clydia Llano, MD   100 mg at 08/18/13 2136  . gabapentin (NEURONTIN) capsule 200 mg  200 mg Oral Daily Clydia Llano, MD   200 mg at 08/19/13 1100  . metroNIDAZOLE (FLAGYL) IVPB 500 mg  500 mg Intravenous Q8H Belkys A Regalado, MD   500 mg at 08/19/13 1300  . midodrine (PROAMATINE) tablet 5 mg  5 mg Oral TID WC Clydia Llano, MD   5 mg at 08/19/13 1257  . morphine 2 MG/ML injection 1 mg  1 mg Intravenous Q4H PRN Clydia Llano, MD      . ondansetron (ZOFRAN) tablet 4 mg  4 mg Oral Q6H PRN Clydia Llano, MD   4 mg at 08/18/13 2135   Or  . ondansetron (ZOFRAN) injection 4 mg  4 mg Intravenous Q6H PRN Clydia Llano, MD      . oxyCODONE (Oxy IR/ROXICODONE) immediate release tablet 5 mg  5 mg Oral Q4H PRN Clydia Llano, MD      . sodium chloride 0.9 % injection 3 mL  3 mL Intravenous Q12H Clydia Llano, MD   3 mL at 08/19/13 1258     Allergies  Allergen Reactions  . Statins Other (See Comments)    Leg cramps and restlessness     Past Medical History  Diagnosis Date  . Lactose intolerance   . IBS (irritable bowel syndrome)   . Dyslipidemia   . 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 Coronary artery disease     a. Cardiac Cath in [place] 2008: nonobstructive coronary disease at the time with the left main 20% stenosis, the LAD luminal irregularity 40% stenosis, the ramus intermediate 95% ostial stenosis. The right coronary artery is dominant and had 40% proximal, 25% mid stenosis. She was treated medically.  . Carotid stenosis, bilateral     a. Dopp 05/2012: 0-39% RICA, 40-59% LICA..  . Cardiomyopathy secondary     EF 25% echo 2013  . PAF (paroxysmal atrial fibrillation)     chronic anticoag  . Spinal stenosis   . Neuromuscular disorder     neuropathy d/t amyloidosis  . ANXIETY   . DEPRESSION   . Branch retinal vein occlusion of left eye     12/2011 event - vision improving - Following with optho and retinal specialist for same  . Combined systolic and diastolic heart failure     a. Due to cardiac amyloidosis - EF 20-25%.   Marland Kitchen Hyperkalemia     a. Adm 01/2013 a/w VT.  Marland Kitchen Ventricular tachyarrhythmia     a. 01/2013 in setting of hyperkalemia requiring cardioversion.  . Cardiac amyloidosis   . Hypotension   . RBBB   . Hypokalemia   . Family history of anesthesia complication     "daughter and granddaughter have rash and qthing else bad; probably from the paternal side" (08/18/2013)  . CHF (congestive heart failure)   . Anemia   . GERD (gastroesophageal reflux disease)     "dx'd; think it was related to  the famalial amyloidosis" (08/18/2013)  . Arthritis     "mild" (08/18/2013)  . Kidney stone     R ureteral stone   . Chronic kidney disease (CKD), stage III (moderate)     Past Surgical History  Procedure Laterality Date  . Excisional hemorrhoidectomy  2002  . Carpal tunnel release Right 2005  . Cysto/ bladder bx and fulgeration  02-21-2007  . Right sural nerve bx/ right gastrocemius muscle bx  09-25-2009  . Appendectomy  1961  . Extracorporeal shock wave lithotripsy Right 08-10-2011  . Cystoscopy  09/2011  . Tonsillectomy  1940's    child  . Cystoscopy with ureteroscopy  10/28/2011    Procedure: CYSTOSCOPY WITH URETEROSCOPY;  Surgeon: Milford Cage, MD;  Location: [facility];  Service: Urology;  Laterality: Right;  . Cataract extraction w/ intraocular lens  implant, bilateral Bilateral 2013  . Breast biopsy Right 1992  . Breast biopsy Left     "after the right; not sure when" (08/18/2013)  . Cardiac catheterization      Family History  Problem Relation Age of Onset  . Heart disease Mother     Heart failure at a later age  . Arthritis Mother   . Arthritis Father   . Heart disease Father   . COPD Other   . Heart disease      Heart failure at a later age    Social History:  reports that she quit smoking about 22 years ago. Her smoking use included Cigarettes. She has a 60 pack-year smoking history. She has never used smokeless tobacco. She reports that she drinks alcohol. She reports that she does not use illicit drugs.   Review of Systems: Constitutional:  denies fever, chills, diaphoresis, appetite change and fatigue.  HEENT: denies photophobia, eye pain, redness, hearing loss, ear pain, congestion, sore throat, rhinorrhea, sneezing, neck pain, neck stiffness and tinnitus.  Respiratory: admits to SOB, DOE, cough, chest tightness, and wheezing.  Cardiovascular: denies chest pain, palpitations and leg swelling.  Gastrointestinal: admits to nausea, vomiting, abdominal pain, diarrhea, constipation, blood in stool.  Genitourinary: denies dysuria, urgency, frequency, hematuria, flank  pain and difficulty urinating.  Musculoskeletal: denies  myalgias, back pain, joint swelling, arthralgias and gait problem.   Skin: admits to  Wound on lower legs  Neurological: admits to weakness  Hematological: admits to adenopathy, easy bruising, personal or family bleeding history.  Psychiatric/ Behavioral: denies suicidal ideation, mood changes, confusion, nervousness, sleep disturbance and agitation.    Physical Exam: BP 100/69  Pulse 104  Temp(Src) 98.3 F (36.8 C) (Oral)  Resp 17  Ht 5\' 1"  (1.549 m)  Wt 126 lb 1.7 oz (57.2 kg)  BMI 23.84 kg/m2  SpO2 95%  LMP 08/04/1991  General: Vital signs reviewed and noted.  Chronically ill appearing female.  Head: Normocephalic, atraumatic, sclera anicteric, mucus membranes are moist  Neck: Supple. Negative for carotid bruits. JVD not elevated.  Lungs:  Clear bilaterally to auscultation without wheezes, rales, or rhonchi. Breathing is unlabored.  Heart: RRR with S1 S2. No murmurs, rubs, or gallops appreciated.  Abdomen:  Soft, non-tender, non-distended with normoactive bowel sounds. No hepatomegaly. No rebound/guarding. No obvious abdominal masses  MSK: Strength and the appear normal for age.  Extremities: No clubbing or cyanosis. No edema.  Distal pedal pulses are 2+ and equal bilaterally.  Neurologic: Alert and oriented X 3. Moves all extremities spontaneously.  Psych: Responds to questions appropriately with a normal affect.    Lab results: Basic Metabolic Panel:  Recent Labs Lab 08/15/13 1037 08/18/13 1418 08/18/13 1529 08/19/13 0440  NA 136 131*  --  130*  K 3.9 7.8* 7.0* 6.6*  CL 89* 89*  --  90*  CO2 38* 29  --  29  GLUCOSE 147* 89  --  94  BUN 46* 58*  --  65*  CREATININE 1.0 1.23*  --  1.49*  CALCIUM 8.8 8.9  --  8.4    Liver Function Tests:  Recent Labs Lab 08/18/13 1418  AST 59*  ALT 55*  ALKPHOS 246*  BILITOT 2.9*  PROT 7.0  ALBUMIN 3.8    Recent Labs Lab 08/18/13 1418  LIPASE 32   No  results found for this basename: AMMONIA,  in the last 168 hours  CBC:  Recent Labs Lab 08/18/13 1418 08/19/13 0440  WBC 9.2 8.2  NEUTROABS 7.4  --   HGB 15.5* 14.1  HCT 47.2* 42.8  MCV 103.7* 103.1*  PLT 193 177    Cardiac Enzymes:  Recent Labs Lab 08/18/13 1418  TROPONINI <0.30    BNP: No components found with this basename: POCBNP,   CBG:  Recent Labs Lab 08/18/13 1914  GLUCAP 82    Coagulation Studies:  Recent Labs  08/18/13 1903 08/19/13 0440  LABPROT 41.0* 44.6*  INR 4.50* 5.03*     Other results: EKG : atrial fib with RVR ,  Very wide QRS associated with hyperkalemia   Imaging: Dg Abd Acute W/chest  08/18/2013   CLINICAL DATA:  Left side/abdominal pain, nausea/vomiting  EXAM: ACUTE ABDOMEN SERIES (ABDOMEN 2 VIEW & CHEST 1 VIEW)  COMPARISON:  Chest radiograph dated 06/04/2013. Abdominal radiograph dated 10/19/2011.  FINDINGS: Small bilateral pleural effusions, unchanged. No frank interstitial edema. No pneumothorax.  Cardiomegaly.  Nonspecific bowel gas pattern, without disproportionate small bowel dilatation to suggest small bowel obstruction. A single mildly prominent loop of small bowel in the left mid abdomen measures 2.9 cm, possibly reflecting adynamic ileus or small bowel enteritis.  No evidence of free air on the lateral decubitus view.  Degenerative changes of the visualized thoracolumbar spine.  IMPRESSION: Small bilateral pleural effusions, unchanged.  No findings to suggest small bowel obstruction or free air.  Mildly prominent loop of small bowel in the left mid abdomen, possibly reflecting adynamic ileus or small bowel enteritis.   Electronically Signed   By: Charline Bills M.D.   On: 08/18/2013 16:59       Assessment & Plan:  1. Chronic systolic and diastolic congestive heart failure:   Secondary to amyloid cardiopathy. The patient has had significant issues with her progressive amyloidosis. She now presents with severe hyperkalemia  with a very abnormal EKG.  Her breathing is about at baseline.  Several days ago her potassium level was in the normal range. She's suspected she may have eaten too much potassium containing food for Christmas dinner.  She's been getting Kayexalate for her hyperkalemia.  Clearly her cardiac and renal status her very tenuous. I think her prognosis is actually very poor and that she has advanced amyloidosis.  We'll continue supportive care. At some point, it may be beneficial for her regular medical doctors ( [facility] and [facility])  to discuss long-term goals. This has been discussed in the past and she wished to remain a full code.  2. Atrial fibrillation: Atrial fibrillation is very common in cardiac amyloidosis.  No new recommendations.  Continue with Coumadin therapy.  3. hyperkalemia: Continue with the Kayexalate as you're doing.    Deloris Ping.  Fran Lowes., MD, [facility] 08/19/2013, 3:20 PM

## 2013-08-20 DIAGNOSIS — I5042 Chronic combined systolic (congestive) and diastolic (congestive) heart failure: Secondary | ICD-10-CM

## 2013-08-20 LAB — PROTIME-INR: Prothrombin Time: 58.7 seconds — ABNORMAL HIGH (ref 11.6–15.2)

## 2013-08-20 MED ORDER — VITAMIN K1 1 MG/0.5ML IJ SOLN
1.0000 mg | Freq: Once | INTRAMUSCULAR | Status: AC
  Administered 2013-08-20: 1 mg via SUBCUTANEOUS
  Filled 2013-08-20: qty 0.5

## 2013-08-20 MED ORDER — POTASSIUM CHLORIDE ER 20 MEQ PO TBCR: EXTENDED_RELEASE_TABLET | ORAL | Status: DC

## 2013-08-20 MED ORDER — SIMETHICONE 80 MG PO CHEW
80.0000 mg | CHEWABLE_TABLET | Freq: Four times a day (QID) | ORAL | Status: DC
  Filled 2013-08-20 (×4): qty 1

## 2013-08-20 NOTE — Progress Notes (Signed)
Progress  NOTE  Date: 08/20/2013               Patient Name:  Miranda [name] MRN: [id number]  DOB: 1940-11-18 Age / Sex: 72 y.o., female        PCP: Rene Paci Primary Cardiologist: Antoine Poche            Referring Physician: Clydia Llano, MD              Reason for Consult: Cardiac amyloidosis           History of Present Illness: Patient is a 72 y.o. female with a PMHx of amyloidosis, coronary artery disease, atrial fibrillation mild carotid artery disease, who was admitted to [facility] on 08/18/2013 for evaluation of generalized weakness and found to have hyperkalemia.  She presented with generalized weakness, leg weakness.   Her PO intake has been very poor.  Her INR is increasing    Medications: Outpatient medications: Prescriptions prior to admission  Medication Sig Dispense Refill  . acetaminophen (TYLENOL) 500 MG tablet Take 500 mg by mouth daily as needed for pain.       . Alpha-D-Galactosidase (BEANO PO) Take 1 tablet by mouth daily as needed (gas).      . calcitRIOL (ROCALTROL) 0.25 MCG capsule Take 0.25 mcg by mouth daily.      . cyanocobalamin (,VITAMIN B-12,) 1000 MCG/ML injection Inject 1,000 mcg into the muscle every 30 (thirty) days. Last dose 01/16/13      . cyclobenzaprine (FLEXERIL) 5 MG tablet Take 5 mg by mouth at bedtime as needed for muscle spasms.       Marland Kitchen docusate sodium (STOOL SOFTENER) 100 MG capsule Take 100 mg by mouth daily as needed for constipation.       . fluticasone (FLONASE) 50 MCG/ACT nasal spray Place 2 sprays into the nose as needed for allergies or rhinitis.       Marland Kitchen gabapentin (NEURONTIN) 100 MG capsule Take 100-200 mg by mouth 2 (two) times daily. Takes 200 mg in the AM and 100 mg at dinner time.  Sometimes takes all 300 mg together depending on stomach pain.      Marland Kitchen loperamide (IMODIUM) 2 MG capsule Take 2 mg by mouth 2 (two) times daily.       . midodrine (PROAMATINE) 5 MG tablet Take 5 mg by mouth 3 (three) times daily with meals.        . Multiple Vitamin (MULTIVITAMIN WITH MINERALS) TABS Take 1 tablet by mouth as needed.       . potassium chloride 20 MEQ TBCR Take 2 tablets (40 mEq total) by mouth twice a day.  120 tablet  2  . SIMETHICONE PO Take 1 tablet by mouth daily as needed (for gas). Simethicone Extra Strength - over the counter - pt not sure of dose      . torsemide (DEMADEX) 20 MG tablet Take 2 tablets (40 mg total) by mouth 2 (two) times daily.      . traMADol (ULTRAM) 50 MG tablet Take 50 mg by mouth every 12 (twelve) hours as needed for moderate pain.      Marland Kitchen warfarin (COUMADIN) 2.5 MG tablet Take 1.25-2.5 mg by mouth daily. Take 1.25mg  on Sun, take 2.5mg  all other days        Current medications: Current Facility-Administered Medications  Medication Dose Route Frequency Provider Last Rate Last Dose  . 0.9 %  sodium chloride infusion   Intravenous Continuous Clydia Llano, MD  75 mL/hr at 08/18/13 2135    . acetaminophen (TYLENOL) tablet 650 mg  650 mg Oral Q6H PRN Clydia Llano, MD       Or  . acetaminophen (TYLENOL) suppository 650 mg  650 mg Rectal Q6H PRN Clydia Llano, MD      . Melene Muller ON 09/15/2013] cyanocobalamin ((VITAMIN B-12)) injection 1,000 mcg  1,000 mcg Intramuscular Q30 days Clydia Llano, MD      . fluticasone (FLONASE) 50 MCG/ACT nasal spray 1 spray  1 spray Each Nare Daily Jinger Neighbors, NP   1 spray at 08/19/13 2336  . gabapentin (NEURONTIN) capsule 100 mg  100 mg Oral QHS Clydia Llano, MD   100 mg at 08/19/13 2219  . gabapentin (NEURONTIN) capsule 200 mg  200 mg Oral Daily Clydia Llano, MD   200 mg at 08/19/13 1100  . metroNIDAZOLE (FLAGYL) IVPB 500 mg  500 mg Intravenous Q8H Belkys A Regalado, MD   500 mg at 08/20/13 0503  . midodrine (PROAMATINE) tablet 5 mg  5 mg Oral TID WC Clydia Llano, MD   5 mg at 08/19/13 1734  . ondansetron (ZOFRAN) tablet 4 mg  4 mg Oral Q6H PRN Clydia Llano, MD   4 mg at 08/18/13 2135   Or  . ondansetron (ZOFRAN) injection 4 mg  4 mg Intravenous Q6H PRN Clydia Llano, MD      . simethicone (MYLICON) chewable tablet 80 mg  80 mg Oral QID PRN Jinger Neighbors, NP   80 mg at 08/19/13 2336  . sodium chloride 0.9 % injection 3 mL  3 mL Intravenous Q12H Clydia Llano, MD   3 mL at 08/19/13 1258  . traMADol (ULTRAM) tablet 50 mg  50 mg Oral Q8H PRN Jinger Neighbors, NP   50 mg at 08/19/13 2335     Allergies  Allergen Reactions  . Statins Other (See Comments)    Leg cramps and restlessness     Past Medical History  Diagnosis Date  . Lactose intolerance   . IBS (irritable bowel syndrome)   . Dyslipidemia   . 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 Coronary artery disease     a. Cardiac Cath in [place] 2008: nonobstructive coronary disease at the time with the left main 20% stenosis, the LAD luminal irregularity 40% stenosis, the ramus intermediate 95% ostial stenosis. The right coronary artery is dominant and had 40% proximal, 25% mid stenosis. She was treated medically.  . Carotid stenosis, bilateral     a. Dopp 05/2012: 0-39% RICA, 40-59% LICA..  . Cardiomyopathy secondary     EF 25% echo 2013  . PAF (paroxysmal atrial fibrillation)     chronic anticoag  . Spinal stenosis   . Neuromuscular disorder     neuropathy d/t amyloidosis  . ANXIETY   . DEPRESSION   . Branch retinal vein occlusion of left eye     12/2011 event - vision improving - Following with optho and retinal specialist for same  . Combined systolic and diastolic heart failure     a. Due to cardiac amyloidosis - EF 20-25%.   Marland Kitchen Hyperkalemia     a. Adm 01/2013 a/w VT.  Marland Kitchen Ventricular tachyarrhythmia     a. 01/2013 in setting of hyperkalemia requiring cardioversion.  . Cardiac amyloidosis   . Hypotension   . RBBB   . Hypokalemia   . Family history of anesthesia complication     "  daughter and granddaughter have rash and qthing else bad; probably from the paternal side" (08/18/2013)  . CHF (congestive heart failure)     . Anemia   . GERD (gastroesophageal reflux disease)     "dx'd; think it was related to the famalial amyloidosis" (08/18/2013)  . Arthritis     "mild" (08/18/2013)  . Kidney stone     R ureteral stone  . Chronic kidney disease (CKD), stage III (moderate)     Past Surgical History  Procedure Laterality Date  . Excisional hemorrhoidectomy  2002  . Carpal tunnel release Right 2005  . Cysto/ bladder bx and fulgeration  02-21-2007  . Right sural nerve bx/ right gastrocemius muscle bx  09-25-2009  . Appendectomy  1961  . Extracorporeal shock wave lithotripsy Right 08-10-2011  . Cystoscopy  09/2011  . Tonsillectomy  1940's    child  . Cystoscopy with ureteroscopy  10/28/2011    Procedure: CYSTOSCOPY WITH URETEROSCOPY;  Surgeon: Milford Cage, MD;  Location: [facility];  Service: Urology;  Laterality: Right;  . Cataract extraction w/ intraocular lens  implant, bilateral Bilateral 2013  . Breast biopsy Right 1992  . Breast biopsy Left     "after the right; not sure when" (08/18/2013)  . Cardiac catheterization      Family History  Problem Relation Age of Onset  . Heart disease Mother     Heart failure at a later age  . Arthritis Mother   . Arthritis Father   . Heart disease Father   . COPD Other   . Heart disease      Heart failure at a later age    Social History:  reports that she quit smoking about 22 years ago. Her smoking use included Cigarettes. She has a 60 pack-year smoking history. She has never used smokeless tobacco. She reports that she drinks alcohol. She reports that she does not use illicit drugs.   Physical Exam: BP 94/70  Pulse 111  Temp(Src) 98.3 F (36.8 C) (Oral)  Resp 18  Ht 5\' 1"  (1.549 m)  Wt 126 lb 1.7 oz (57.2 kg)  BMI 23.84 kg/m2  SpO2 98%  LMP 08/04/1991  General: Vital signs reviewed and noted.  Chronically ill appearing female.  Head: Normocephalic, atraumatic, sclera anicteric, mucus membranes are moist  Neck: Supple. Negative for  carotid bruits. JVD not elevated.  Lungs:  Clear bilaterally to auscultation without wheezes, rales, or rhonchi. Breathing is unlabored.  Heart: RRR with S1 S2. No murmurs, rubs, or gallops appreciated.  Abdomen:  abd is nontender.    MSK: Strength and the appear normal for age.  Extremities: No clubbing or cyanosis. No edema.  Distal pedal pulses are 2+ and equal bilaterally.  Neurologic: Alert and oriented X 3. Moves all extremities spontaneously.  Psych: Responds to questions appropriately with a normal affect.    Lab results: Basic Metabolic Panel:  Recent Labs Lab 08/18/13 1418 08/18/13 1529 08/19/13 0440 08/19/13 1640  NA 131*  --  130* 138  K 7.8* 7.0* 6.6* 4.2  CL 89*  --  90* 94*  CO2 29  --  29 30  GLUCOSE 89  --  94 102*  BUN 58*  --  65* 65*  CREATININE 1.23*  --  1.49* 1.45*  CALCIUM 8.9  --  8.4 8.3*    Liver Function Tests:  Recent Labs Lab 08/18/13 1418  AST 59*  ALT 55*  ALKPHOS 246*  BILITOT 2.9*  PROT 7.0  ALBUMIN 3.8  Recent Labs Lab 08/18/13 1418  LIPASE 32   No results found for this basename: AMMONIA,  in the last 168 hours  CBC:  Recent Labs Lab 08/18/13 1418 08/19/13 0440  WBC 9.2 8.2  NEUTROABS 7.4  --   HGB 15.5* 14.1  HCT 47.2* 42.8  MCV 103.7* 103.1*  PLT 193 177    Cardiac Enzymes:  Recent Labs Lab 08/18/13 1418  TROPONINI <0.30    BNP: No components found with this basename: POCBNP,   CBG:  Recent Labs Lab 08/18/13 1914  GLUCAP 82    Coagulation Studies:  Recent Labs  08/18/13 1903 08/19/13 0440 08/20/13 0440  LABPROT 41.0* 44.6* 58.7*  INR 4.50* 5.03* 7.21*     Other results: EKG : atrial fib with RVR ,  Very wide QRS associated with hyperkalemia   Imaging: Dg Abd 2 Views  08/19/2013   CLINICAL DATA:  Abdominal distention, history of pleural effusion, cardiac disease  EXAM: ABDOMEN - 2 VIEW  COMPARISON:  08/18/2013  FINDINGS: Bowel gas identified within nondistended large and small  bowel loops in mid abdomen.  Bowel loops appear centrally displaced with diffusely increased attenuation of the abdomen most likely representing significant ascites.  Scattered atherosclerotic calcifications.  Bibasilar pleural effusions and atelectasis.  Bones demineralized with degenerative changes and levoscoliosis lumbar spine.  No free intraperitoneal air or urinary tract calcification.  IMPRESSION: Nonobstructive bowel gas pattern.  Significant ascites and bibasilar pleural effusions.   Electronically Signed   By: Ulyses Southward M.D.   On: 08/19/2013 17:44   Dg Abd Acute W/chest  08/18/2013   CLINICAL DATA:  Left side/abdominal pain, nausea/vomiting  EXAM: ACUTE ABDOMEN SERIES (ABDOMEN 2 VIEW & CHEST 1 VIEW)  COMPARISON:  Chest radiograph dated 06/04/2013. Abdominal radiograph dated 10/19/2011.  FINDINGS: Small bilateral pleural effusions, unchanged. No frank interstitial edema. No pneumothorax.  Cardiomegaly.  Nonspecific bowel gas pattern, without disproportionate small bowel dilatation to suggest small bowel obstruction. A single mildly prominent loop of small bowel in the left mid abdomen measures 2.9 cm, possibly reflecting adynamic ileus or small bowel enteritis.  No evidence of free air on the lateral decubitus view.  Degenerative changes of the visualized thoracolumbar spine.  IMPRESSION: Small bilateral pleural effusions, unchanged.  No findings to suggest small bowel obstruction or free air.  Mildly prominent loop of small bowel in the left mid abdomen, possibly reflecting adynamic ileus or small bowel enteritis.   Electronically Signed   By: Charline Bills M.D.   On: 08/18/2013 16:59      Assessment & Plan:  1. Chronic systolic and diastolic congestive heart failure:   Secondary to amyloid cardiopathy. The patient has had significant issues with her progressive amyloidosis. She now presents with severe hyperkalemia with a very abnormal EKG.  Her breathing is about at baseline.  Several  days ago her potassium level was in the normal range. She's suspected she may have eaten too much potassium containing food for Christmas dinner. She's been getting Kayexalate for her hyperkalemia.  Clearly her cardiac and renal status her very tenuous. I think her prognosis is actually very poor and that she has advanced amyloidosis.  We'll continue supportive care. At some point, it may be beneficial for her regular medical doctors ( Hochrein and Dunnigan)  to discuss long-term goals. This has been discussed in the past and she wished to remain a full code.  2. Atrial fibrillation: Atrial fibrillation is very common in cardiac amyloidosis.  No new recommendations.  Continue  with Coumadin therapy.  Her INR is > 7.  She is not eating because of an ilius.  Will give her IV vit K 1 mg.   3. hyperkalemia: resolved.     Vesta Mixer, Montez Hageman., MD, [facility] 08/20/2013, 9:39 AM

## 2013-08-20 NOTE — Progress Notes (Signed)
Pt had 5 bts VTach on tele, VSS.  Sats 89-91% on room air with congested cough and scattered expiratory wheezes. O2@2L  applied per Walker.  MD notified of Vtach.  Pt also reported that she was unsure of how much she is urinating.  We have been unable to measure do to urine mixed with stool.  Bladder scan revealed .  Pt assisted to bedside commode to urinate.  Urine not measured, bladder scan redone for PVR = .  Pt denies discomfort.  Will continue to monitor.

## 2013-08-20 NOTE — Progress Notes (Signed)
CRITICAL VALUE ALERT  Critical value received:  INR 7.21   Date of notification:  08/20/2013  Time of notification: 0710  Critical value read back:yes  Nurse who received alert:  Westley Chandler  MD notified (1st page):  [place]  Time of first page:  0755  MD notified (2nd page):  Time of second page:  Responding MD:    Time MD responded:

## 2013-08-20 NOTE — Progress Notes (Signed)
PT Cancellation Note  Patient Details Name: Miranda [name] MRN: [id number] DOB: 23-Sep-1940   Cancelled Treatment:    Reason Eval/Treat Not Completed: Medical issues which prohibited therapy.  Pt's INR 7.21.    Mkayla Steele 08/20/2013, 7:49 AM  Jake Shark, PT DPT [phone]

## 2013-08-20 NOTE — Progress Notes (Signed)
Patient given teaching material re:  Ileus so that she may better understand her treatment regimen and symptomatology.  Was resistant at first, but thankful for the information.  She called her daughter and son in to see her and to "rally in her defense".   Up to commode with 2 assists to void mod amt. Dark, concentrated urine.

## 2013-08-20 NOTE — Progress Notes (Signed)
[facility] PROGRESS NOTE  Miranda [name] ZOX:[id number] DOB: 1941-07-21 DOA: 08/18/2013 PCP: Rene Paci, MD  Assessment/Plan: Hyperkalemia  -Initial potassium was 7.8, potassium this morning is 4.2 -Patient received  insulin and glucose as well as calcium gluconate on admission.    Acute renal failure  -Mild acute renal failure, likely secondary to diuresis.  -Creatinine baseline is 1.0, creatinine today is 1.45 -Hold diuretics in the nephrotoxic medications, hydrate gently with IV fluids.   ? Adynamic ileus vs enteritis -Patient presented with nausea, vomiting and abdominal bloating.  -Abdominal x-ray showed question of adynamic ileus.  -Will advance the diet to full liquid diet -Had Multiple BM after kayexalate.  -Flagyl started.   Combined chronic CHF/restrictive cardiomyopathy  -This is secondary to cardiac amyloidosis, currently patient with acute renal failure.  -As mentioned above the diuretics will be held and gentle hydration with IV fluids.  -cardio following, no new recommendations.  Atrial fibrillation  -Heart rate is controlled, patient is on Coumadin.  - pharmacy to dose the Coumadin. -INR at 7.21, vitamin k 5 mg IV x 1 ordered.   Code Status: Full Code.  Family Communication: care discussed with patient.  Disposition Plan: remain inpatient.    Consultants:  Cardiology , patient request.   Procedures:  None.   Antibiotics:  Flagyl 12-25  HPI/Subjective: Patient wants to eat.  Objective: Filed Vitals:   08/20/13 0411  BP: 94/70  Pulse: 111  Temp: 98.3 F (36.8 C)  Resp: 18    Intake/Output Summary (Last 24 hours) at 08/20/13 1426 Last data filed at 08/20/13 0930  Gross per 24 hour  Intake    405 ml  Output    250 ml  Net    155 ml   Filed Weights   08/18/13 1211 08/18/13 2015  Weight: 56.246 kg (124 lb) 57.2 kg (126 lb 1.7 oz)    Exam:  Physical Exam: Head: Normocephalic, atraumatic.  Eyes: No signs of  jaundice, EOMI Nose: Mucous membranes dry.  Throat: Oropharynx nonerythematous, no exudate appreciated.  Neck: supple,No deformities, masses, or tenderness noted. Lungs: Normal respiratory effort. B/L Clear to auscultation, no crackles or wheezes.  Heart: Regular RR. S1 and S2 normal  Abdomen: BS normoactive. Soft, Distended, no organomegaly Extremities: No pretibial edema, no erythema   Data Reviewed: Basic Metabolic Panel:  Recent Labs Lab 08/15/13 1037 08/18/13 1418 08/18/13 1529 08/19/13 0440 08/19/13 1640  NA 136 131*  --  130* 138  K 3.9 7.8* 7.0* 6.6* 4.2  CL 89* 89*  --  90* 94*  CO2 38* 29  --  29 30  GLUCOSE 147* 89  --  94 102*  BUN 46* 58*  --  65* 65*  CREATININE 1.0 1.23*  --  1.49* 1.45*  CALCIUM 8.8 8.9  --  8.4 8.3*   Liver Function Tests:  Recent Labs Lab 08/18/13 1418  AST 59*  ALT 55*  ALKPHOS 246*  BILITOT 2.9*  PROT 7.0  ALBUMIN 3.8    Recent Labs Lab 08/18/13 1418  LIPASE 32   No results found for this basename: AMMONIA,  in the last 168 hours CBC:  Recent Labs Lab 08/18/13 1418 08/19/13 0440  WBC 9.2 8.2  NEUTROABS 7.4  --   HGB 15.5* 14.1  HCT 47.2* 42.8  MCV 103.7* 103.1*  PLT 193 177   Cardiac Enzymes:  Recent Labs Lab 08/18/13 1418  TROPONINI <0.30   BNP (last 3 results)  Recent Labs  06/03/13 2017 06/30/13 1201 07/31/13  1229  PROBNP 46285.0* 1621.0* 1124.0*   CBG:  Recent Labs Lab 08/18/13 1914  GLUCAP 82    Recent Results (from the past 240 hour(s))  URINE CULTURE     Status: None   Collection Time    08/18/13  4:11 PM      Result Value Range Status   Specimen Description URINE, RANDOM   Final   Special Requests NONE   Final   Culture  Setup Time     Final   Value: 08/18/2013 17:06     Performed at [facility] Count     Final   Value: 10,000 COLONIES/ML     Performed at [facility]   Culture     Final   Value: Multiple bacterial morphotypes present, none  predominant. Suggest appropriate recollection if clinically indicated.     Performed at [facility]   Report Status 08/19/2013 FINAL   Final     Studies: Dg Abd 2 Views  08/19/2013   CLINICAL DATA:  Abdominal distention, history of pleural effusion, cardiac disease  EXAM: ABDOMEN - 2 VIEW  COMPARISON:  08/18/2013  FINDINGS: Bowel gas identified within nondistended large and small bowel loops in mid abdomen.  Bowel loops appear centrally displaced with diffusely increased attenuation of the abdomen most likely representing significant ascites.  Scattered atherosclerotic calcifications.  Bibasilar pleural effusions and atelectasis.  Bones demineralized with degenerative changes and levoscoliosis lumbar spine.  No free intraperitoneal air or urinary tract calcification.  IMPRESSION: Nonobstructive bowel gas pattern.  Significant ascites and bibasilar pleural effusions.   Electronically Signed   By: Ulyses Southward M.D.   On: 08/19/2013 17:44   Dg Abd Acute W/chest  08/18/2013   CLINICAL DATA:  Left side/abdominal pain, nausea/vomiting  EXAM: ACUTE ABDOMEN SERIES (ABDOMEN 2 VIEW & CHEST 1 VIEW)  COMPARISON:  Chest radiograph dated 06/04/2013. Abdominal radiograph dated 10/19/2011.  FINDINGS: Small bilateral pleural effusions, unchanged. No frank interstitial edema. No pneumothorax.  Cardiomegaly.  Nonspecific bowel gas pattern, without disproportionate small bowel dilatation to suggest small bowel obstruction. A single mildly prominent loop of small bowel in the left mid abdomen measures 2.9 cm, possibly reflecting adynamic ileus or small bowel enteritis.  No evidence of free air on the lateral decubitus view.  Degenerative changes of the visualized thoracolumbar spine.  IMPRESSION: Small bilateral pleural effusions, unchanged.  No findings to suggest small bowel obstruction or free air.  Mildly prominent loop of small bowel in the left mid abdomen, possibly reflecting adynamic ileus or small bowel  enteritis.   Electronically Signed   By: Charline Bills M.D.   On: 08/18/2013 16:59    Scheduled Meds: . [START ON 09/15/2013] cyanocobalamin  1,000 mcg Intramuscular Q30 days  . fluticasone  1 spray Each Nare Daily  . gabapentin  100 mg Oral QHS  . gabapentin  200 mg Oral Daily  . metronidazole  500 mg Intravenous Q8H  . midodrine  5 mg Oral TID WC  . simethicone  80 mg Oral QID  . sodium chloride  3 mL Intravenous Q12H   Continuous Infusions: . sodium chloride 75 mL/hr at 08/18/13 2135    Principal Problem:   Hyperkalemia Active Problems:   HYPERTENSION   ATRIAL ARRHYTHMIAS   Long term current use of anticoagulant   Chronic combined systolic and diastolic heart failure   Cardiac amyloidosis   UTI (urinary tract infection)   ARF (acute renal failure)    Time spent: 30  [facility]  [facility] Pager [phone]. If 7PM-7AM, please contact night-coverage at [email or web address], password [email or web address] 08/20/2013, 2:26 PM  LOS: 2 days

## 2013-08-20 NOTE — Progress Notes (Signed)
Patient weepy.  States wants to eat and wants to go home.  Attempted to discuss the diagnosis of "ileus" with her, but she wants to call her daughter in for "reinforcements".  Patient feels that she will not get better unless she is allowed to eat protein.  Abdomen grossly distended.  Unable to auscultate bowel sounds in any quadrant.  Previous shift RN states urinary retention is also a problem with her.  She does not complain of nausea or vomiting at this time.

## 2013-08-20 NOTE — Progress Notes (Signed)
Discussed discharge instructions with patient and sons.  Patient states understands changes in medication, F/U appointments due, activity limitations, and when to call her PCP.  Discussed "My Chart" with family and with patient.  Comprehension of diagnosis, dietary cautions, and medications verified with use of "teachback" method of teaching.  Patient discharged to private residence with children.  Escorted to exit via wheelchair by nurse tech.

## 2013-08-20 NOTE — Consult Note (Signed)
ANTICOAGULATION CONSULT NOTE - Initial Consult  Pharmacy Consult for Coumadin Indication: atrial fibrillation  Allergies  Allergen Reactions  . Statins Other (See Comments)    Leg cramps and restlessness    Patient Measurements: Height: 5\' 1"  (154.9 cm) Weight: 126 lb 1.7 oz (57.2 kg) IBW/kg (Calculated) : 47.8  Vital Signs: Temp: 98.3 F (36.8 C) (12/28 0411) Temp src: Oral (12/28 0411) BP: 94/70 mmHg (12/28 0411) Pulse Rate: 111 (12/28 0411)  Labs:  Recent Labs  08/18/13 1418 08/18/13 1903 08/19/13 0440 08/19/13 1640 08/20/13 0440  HGB 15.5*  --  14.1  --   --   HCT 47.2*  --  42.8  --   --   PLT 193  --  177  --   --   LABPROT  --  41.0* 44.6*  --  58.7*  INR  --  4.50* 5.03*  --  7.21*  CREATININE 1.23*  --  1.49* 1.45*  --   TROPONINI <0.30  --   --   --   --     Estimated Creatinine Clearance: 26.1 ml/min (by C-G formula based on Cr of 1.45).   Medical History: Past Medical History  Diagnosis Date  . Lactose intolerance   . IBS (irritable bowel syndrome)   . Dyslipidemia   . 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 Coronary artery disease     a. Cardiac Cath in [place] 2008: nonobstructive coronary disease at the time with the left main 20% stenosis, the LAD luminal irregularity 40% stenosis, the ramus intermediate 95% ostial stenosis. The right coronary artery is dominant and had 40% proximal, 25% mid stenosis. She was treated medically.  . Carotid stenosis, bilateral     a. Dopp 05/2012: 0-39% RICA, 40-59% LICA..  . Cardiomyopathy secondary     EF 25% echo 2013  . PAF (paroxysmal atrial fibrillation)     chronic anticoag  . Spinal stenosis   . Neuromuscular disorder     neuropathy d/t amyloidosis  . ANXIETY   . DEPRESSION   . Branch retinal vein occlusion of left eye     12/2011 event - vision improving - Following with optho and retinal specialist for same  . Combined  systolic and diastolic heart failure     a. Due to cardiac amyloidosis - EF 20-25%.   Marland Kitchen Hyperkalemia     a. Adm 01/2013 a/w VT.  Marland Kitchen Ventricular tachyarrhythmia     a. 01/2013 in setting of hyperkalemia requiring cardioversion.  . Cardiac amyloidosis   . Hypotension   . RBBB   . Hypokalemia   . Family history of anesthesia complication     "daughter and granddaughter have rash and qthing else bad; probably from the paternal side" (08/18/2013)  . CHF (congestive heart failure)   . Anemia   . GERD (gastroesophageal reflux disease)     "dx'd; think it was related to the famalial amyloidosis" (08/18/2013)  . Arthritis     "mild" (08/18/2013)  . Kidney stone     R ureteral stone  . Chronic kidney disease (CKD), stage III (moderate)    Assessment: 72yof on coumadin pta for afib presented to the ED with weakness and fall. Pt has cardiac amyloidosis they may be causing hepatorenal issues in this patient. Found to be hyperkalemic with mild ARF and also with a questionable adynamic ileus w/ elevated LFTs. INR remains supratherapeutic. No signs of bleed noted.  Home dose: 2.5mg  daily except 1.25mg  on Sunday - last dose 12/25  Goal of Therapy:  INR 2-3 Monitor platelets by anticoagulation protocol: Yes   Plan:  1) Coumadin on hold tonight 2) Daily INR  Forestine Na M 08/20/2013,12:54 PM

## 2013-08-20 NOTE — Discharge Summary (Signed)
Physician Discharge Summary  Miranda [name] [id number] DOB: 08-29-1940 DOA: 08/18/2013  PCP: Rene Paci, MD  Admit date: 08/18/2013 Discharge date: 08/20/2013  Time spent: 50* minutes  Recommendations for Outpatient Follow-up:  1. Follow up Cardiology in 3 days to check the PT/INR 2. Follow up PCP in one week  Discharge Diagnoses:  Principal Problem:   Hyperkalemia Active Problems:   HYPERTENSION   ATRIAL ARRHYTHMIAS   Long term current use of anticoagulant   Chronic combined systolic and diastolic heart failure   Cardiac amyloidosis   UTI (urinary tract infection)   ARF (acute renal failure)   Discharge Condition: *Stable  Diet recommendation: Low salt diet  Filed Weights   08/18/13 1211 08/18/13 2015  Weight: 56.246 kg (124 lb) 57.2 kg (126 lb 1.7 oz)    History of present illness:  72 y.o. 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 Patient came to the hospital because of weakness and a fall. Patient says for the past few days she was feeling weak, and nauseated and bloated. After she ate her Christmas began yesterday she felt that due to and she went to bed early. This morning she got weak to the point she fell so she came in to the hospital for further evaluation.  In the ED patient was found to have hyperkalemia with potassium level of 7.8, repeated and the potassium is 7.0. She had mild acute renal failure with creatinine of 1.23, patient has also mild transaminitis. Abdominal chest x-ray showed possible adynamic ileus or small bowel enteritis.   Hospital Course:  Hyperkalemia  -Initial potassium was 7.8, potassium this morning is 4.2  -Patient received insulin and glucose as well as calcium gluconate on admission.   Acute renal failure  -Mild acute renal failure, likely secondary to diuresis.  -Creatinine baseline is 1.0, creatinine  today is 1.45  -Will restart the Demadex at home dose and change the potassium dose to 20 meq po TID - Will need BMP in 3 days.  ? Adynamic ileus vs enteritis  -Patient presented with nausea, vomiting and abdominal bloating.  -Abdominal x-ray showed nonobstructive bowel gas pattern - No nausea, vomiting. _ Patient is now tolerating diet and wants to go home. -Will advance the diet to full liquid diet     Combined chronic CHF/restrictive cardiomyopathy  -This is secondary to cardiac amyloidosis, currently patient with acute renal failure.  -As mentioned above the diuretics will be held and gentle hydration with IV fluids.  -cardio following, no new recommendations.   Atrial fibrillation  -Heart rate is controlled, patient is on Coumadin.  - pharmacy to dose the Coumadin.  -INR at 7.21, vitamin k 5 mg IV x 1 ordered. - Will need repeat the INR in three days before restarting coumadin.   Procedures:  *None  Consultations:  cardiology  Discharge Exam: Filed Vitals:   08/20/13 0411  BP: 94/70  Pulse: 111  Temp: 98.3 F (36.8 C)  Resp: 18    *  Discharge Instructions  Discharge Orders   Future Appointments Provider Department Dept Phone   08/22/2013 11:00 AM Rollene Rotunda, MD [facility] [place] Office [phone]   08/28/2013 11:15 AM Santiago Bumpers, DPM [facility] [phone]   09/12/2013 11:00 AM [facility] [place] Office [phone]   09/25/2013 3:30 PM Newt Lukes, MD [facility] [phone]   Future Orders Complete  By Expires   Diet - low sodium heart healthy  As directed    Discharge instructions  As directed    Comments:     Do not take Coumadin till you get PT/INR checked on 08/22/13 at your cardiologist office   Increase activity slowly  As directed        Medication List         acetaminophen 500 MG tablet  Commonly known as:  TYLENOL  Take 500 mg by  mouth daily as needed for pain.     BEANO PO  Take 1 tablet by mouth daily as needed (gas).     calcitRIOL 0.25 MCG capsule  Commonly known as:  ROCALTROL  Take 0.25 mcg by mouth daily.     cyanocobalamin 1000 MCG/ML injection  Commonly known as:  (VITAMIN B-12)  Inject 1,000 mcg into the muscle every 30 (thirty) days. Last dose 01/16/13     cyclobenzaprine 5 MG tablet  Commonly known as:  FLEXERIL  Take 5 mg by mouth at bedtime as needed for muscle spasms.     fluticasone 50 MCG/ACT nasal spray  Commonly known as:  FLONASE  Place 2 sprays into the nose as needed for allergies or rhinitis.     gabapentin 100 MG capsule  Commonly known as:  NEURONTIN  Take 100-200 mg by mouth 2 (two) times daily. Takes 200 mg in the AM and 100 mg at dinner time.  Sometimes takes all 300 mg together depending on stomach pain.     loperamide 2 MG capsule  Commonly known as:  IMODIUM  Take 2 mg by mouth 2 (two) times daily.     midodrine 5 MG tablet  Commonly known as:  PROAMATINE  Take 5 mg by mouth 3 (three) times daily with meals.     multivitamin with minerals Tabs tablet  Take 1 tablet by mouth as needed.     Potassium Chloride ER 20 MEQ Tbcr  Take 20 meq po BID     SIMETHICONE PO  Take 1 tablet by mouth daily as needed (for gas). Simethicone Extra Strength - over the counter - pt not sure of dose     STOOL SOFTENER 100 MG capsule  Generic drug:  docusate sodium  Take 100 mg by mouth daily as needed for constipation.     torsemide 20 MG tablet  Commonly known as:  DEMADEX  Take 2 tablets (40 mg total) by mouth 2 (two) times daily.     traMADol 50 MG tablet  Commonly known as:  ULTRAM  Take 50 mg by mouth every 12 (twelve) hours as needed for moderate pain.     warfarin 2.5 MG tablet  Commonly known as:  COUMADIN  Take 1.25-2.5 mg by mouth daily. Take 1.25mg  on Sun, take 2.5mg  all other days       Allergies  Allergen Reactions  . Statins Other (See Comments)    Leg cramps  and restlessness       Follow-up Information   Follow up with Rene Paci, MD In 1 week.   Specialty:  Internal Medicine   Contact information:   [address] [place] [address] [phone]       Follow up with Rollene Rotunda, MD In 3 days. (On tuesday to check Pt/INR)    Specialty:  Cardiology   Contact information:   [address] [place] [address] [phone]  The results of significant diagnostics from this hospitalization (including imaging, microbiology, ancillary and laboratory) are listed below for reference.    Significant Diagnostic Studies: Dg Abd 2 Views  08/19/2013   CLINICAL DATA:  Abdominal distention, history of pleural effusion, cardiac disease  EXAM: ABDOMEN - 2 VIEW  COMPARISON:  08/18/2013  FINDINGS: Bowel gas identified within nondistended large and small bowel loops in mid abdomen.  Bowel loops appear centrally displaced with diffusely increased attenuation of the abdomen most likely representing significant ascites.  Scattered atherosclerotic calcifications.  Bibasilar pleural effusions and atelectasis.  Bones demineralized with degenerative changes and levoscoliosis lumbar spine.  No free intraperitoneal air or urinary tract calcification.  IMPRESSION: Nonobstructive bowel gas pattern.  Significant ascites and bibasilar pleural effusions.   Electronically Signed   By: Ulyses Southward M.D.   On: 08/19/2013 17:44   Dg Abd Acute W/chest  08/18/2013   CLINICAL DATA:  Left side/abdominal pain, nausea/vomiting  EXAM: ACUTE ABDOMEN SERIES (ABDOMEN 2 VIEW & CHEST 1 VIEW)  COMPARISON:  Chest radiograph dated 06/04/2013. Abdominal radiograph dated 10/19/2011.  FINDINGS: Small bilateral pleural effusions, unchanged. No frank interstitial edema. No pneumothorax.  Cardiomegaly.  Nonspecific bowel gas pattern, without disproportionate small bowel dilatation to suggest small bowel  obstruction. A single mildly prominent loop of small bowel in the left mid abdomen measures 2.9 cm, possibly reflecting adynamic ileus or small bowel enteritis.  No evidence of free air on the lateral decubitus view.  Degenerative changes of the visualized thoracolumbar spine.  IMPRESSION: Small bilateral pleural effusions, unchanged.  No findings to suggest small bowel obstruction or free air.  Mildly prominent loop of small bowel in the left mid abdomen, possibly reflecting adynamic ileus or small bowel enteritis.   Electronically Signed   By: Charline Bills M.D.   On: 08/18/2013 16:59    Microbiology: Recent Results (from the past 240 hour(s))  URINE CULTURE     Status: None   Collection Time    08/18/13  4:11 PM      Result Value Range Status   Specimen Description URINE, RANDOM   Final   Special Requests NONE   Final   Culture  Setup Time     Final   Value: 08/18/2013 17:06     Performed at [facility] Count     Final   Value: 10,000 COLONIES/ML     Performed at [facility]   Culture     Final   Value: Multiple bacterial morphotypes present, none predominant. Suggest appropriate recollection if clinically indicated.     Performed at [facility]   Report Status 08/19/2013 FINAL   Final     Labs: Basic Metabolic Panel:  Recent Labs Lab 08/15/13 1037 08/18/13 1418 08/18/13 1529 08/19/13 0440 08/19/13 1640  NA 136 131*  --  130* 138  K 3.9 7.8* 7.0* 6.6* 4.2  CL 89* 89*  --  90* 94*  CO2 38* 29  --  29 30  GLUCOSE 147* 89  --  94 102*  BUN 46* 58*  --  65* 65*  CREATININE 1.0 1.23*  --  1.49* 1.45*  CALCIUM 8.8 8.9  --  8.4 8.3*   Liver Function Tests:  Recent Labs Lab 08/18/13 1418  AST 59*  ALT 55*  ALKPHOS 246*  BILITOT 2.9*  PROT 7.0  ALBUMIN 3.8    Recent Labs Lab 08/18/13 1418  LIPASE 32   No results found for this basename: AMMONIA,  in the last 168 hours CBC:  Recent Labs Lab 08/18/13 1418 08/19/13 0440   WBC 9.2 8.2  NEUTROABS 7.4  --   HGB 15.5* 14.1  HCT 47.2* 42.8  MCV 103.7* 103.1*  PLT 193 177   Cardiac Enzymes:  Recent Labs Lab 08/18/13 1418  TROPONINI <0.30   BNP: BNP (last 3 results)  Recent Labs  06/03/13 2017 06/30/13 1201 07/31/13 1229  PROBNP 46285.0* 1621.0* 1124.0*   CBG:  Recent Labs Lab 08/18/13 1914  GLUCAP 82       Signed:  Kambrie Eddleman S  [facility] 08/20/2013, 5:15 PM

## 2013-08-21 ENCOUNTER — Ambulatory Visit: Payer: Medicare Other | Admitting: Podiatry

## 2013-08-22 ENCOUNTER — Telehealth: Payer: Self-pay | Admitting: Cardiology

## 2013-08-22 ENCOUNTER — Ambulatory Visit (INDEPENDENT_AMBULATORY_CARE_PROVIDER_SITE_OTHER): Payer: Medicare Other | Admitting: *Deleted

## 2013-08-22 ENCOUNTER — Ambulatory Visit (INDEPENDENT_AMBULATORY_CARE_PROVIDER_SITE_OTHER): Payer: Medicare Other | Admitting: Cardiology

## 2013-08-22 ENCOUNTER — Encounter: Payer: Self-pay | Admitting: Cardiology

## 2013-08-22 VITALS — BP 78/46 | HR 94 | Ht 65.0 in | Wt 133.1 lb

## 2013-08-22 DIAGNOSIS — E8589 Other amyloidosis: Secondary | ICD-10-CM

## 2013-08-22 DIAGNOSIS — I5043 Acute on chronic combined systolic (congestive) and diastolic (congestive) heart failure: Secondary | ICD-10-CM

## 2013-08-22 DIAGNOSIS — E876 Hypokalemia: Secondary | ICD-10-CM

## 2013-08-22 DIAGNOSIS — I498 Other specified cardiac arrhythmias: Secondary | ICD-10-CM

## 2013-08-22 DIAGNOSIS — Z79899 Other long term (current) drug therapy: Secondary | ICD-10-CM

## 2013-08-22 DIAGNOSIS — Z7901 Long term (current) use of anticoagulants: Secondary | ICD-10-CM

## 2013-08-22 MED ORDER — TORSEMIDE 20 MG PO TABS: 60.0000 mg | ORAL_TABLET | Freq: Two times a day (BID) | ORAL | Status: DC

## 2013-08-22 NOTE — Patient Instructions (Addendum)
Please take Demedex 20 mg 3 tablets twice a day. Continue all other medications as listed  Please have blood work on Friday (BMP)  Please see PA/NP in 1 week.  You have been referred for home health.

## 2013-08-22 NOTE — Telephone Encounter (Signed)
Per daughter - they would like Hospice order placed - she and the pt are going to continue to discuss it.

## 2013-08-22 NOTE — Progress Notes (Signed)
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  She was hospitalized recently with weakness and a sense of abdominal fullness. She was found to be hyperkalemic on admission. She had some mild acute on chronic renal insufficiency. She is at home and presents today with her son-in-law who lives in [place] and is a physician. She didn't weigh her self yesterday which was her first day home from the hospital. She thinks her legs a little more swollen. She apparently has only taken 60 mg of Demadex for the last 2 days. She is profoundly weak. She hasn't been eating much although her appetite she thinks is a little bit better. She's not describing any new PND or orthopnea. She's not been having any new chest pressure, neck or arm discomfort.   Allergies  Allergen Reactions  . Statins Other (See Comments)    Leg cramps and restlessness    Current Outpatient Prescriptions  Medication Sig Dispense Refill  . acetaminophen (TYLENOL) 500 MG tablet Take 500 mg by mouth daily as needed for pain.       . Alpha-D-Galactosidase (BEANO PO) Take 1 tablet by mouth daily as needed (gas).      . calcitRIOL (ROCALTROL) 0.25 MCG capsule Take 0.25 mcg by mouth daily.      . cyanocobalamin (,VITAMIN B-12,) 1000 MCG/ML injection Inject 1,000 mcg into the muscle every 30 (thirty) days. Last dose 01/16/13      . cyclobenzaprine (FLEXERIL) 5 MG tablet Take 5 mg by mouth at bedtime as needed for muscle spasms.       Marland Kitchen docusate sodium (STOOL SOFTENER) 100 MG capsule Take 100 mg by mouth daily as needed for constipation.       . fluticasone (FLONASE) 50 MCG/ACT nasal spray Place 2 sprays into the nose as needed for allergies or rhinitis.       Marland Kitchen gabapentin (NEURONTIN) 100 MG capsule Take 100-200 mg by mouth 2 (two) times daily. Takes 200 mg in the AM and 100 mg at dinner time.  Sometimes takes all 300 mg together depending on stomach pain. Pt also states that she takes up to 900 mg if needed.        . loperamide (IMODIUM) 2 MG capsule Take 2 mg by mouth 2 (two) times daily.       . midodrine (PROAMATINE) 5 MG tablet Take 5 mg by mouth 3 (three) times daily with meals.       . Multiple Vitamin (MULTIVITAMIN WITH MINERALS) TABS Take 1 tablet by mouth as needed.       . Potassium Chloride ER 20 MEQ TBCR Take 20 meq po BID  120 tablet  2  . SIMETHICONE PO Take 1 tablet by mouth daily as needed (for gas). Simethicone Extra Strength - over the counter - pt not sure of dose      . torsemide (DEMADEX) 20 MG tablet Take 2 tablets (40 mg total) by mouth 2 (two) times daily.      . traMADol (ULTRAM) 50 MG tablet Take 50 mg by mouth every 12 (twelve) hours as needed for moderate pain.      Marland Kitchen warfarin (COUMADIN) 2.5 MG tablet Take 1.25-2.5 mg by mouth daily. Take 1.25mg  on Sun, take 2.5mg  all other days       No current facility-administered medications for this visit.    Past Medical History  Diagnosis Date  . Lactose intolerance   . IBS (irritable bowel syndrome)   . Dyslipidemia   .  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 Coronary artery disease     a. Cardiac Cath in [place] 2008: nonobstructive coronary disease at the time with the left main 20% stenosis, the LAD luminal irregularity 40% stenosis, the ramus intermediate 95% ostial stenosis. The right coronary artery is dominant and had 40% proximal, 25% mid stenosis. She was treated medically.  . Carotid stenosis, bilateral     a. Dopp 05/2012: 0-39% RICA, 40-59% LICA..  . Cardiomyopathy secondary     EF 25% echo 2013  . PAF (paroxysmal atrial fibrillation)     chronic anticoag  . Spinal stenosis   . Neuromuscular disorder     neuropathy d/t amyloidosis  . ANXIETY   . DEPRESSION   . Branch retinal vein occlusion of left eye     12/2011 event - vision improving - Following with optho and retinal specialist for same  . Combined systolic and diastolic heart failure      a. Due to cardiac amyloidosis - EF 20-25%.   Marland Kitchen Hyperkalemia     a. Adm 01/2013 a/w VT.  Marland Kitchen Ventricular tachyarrhythmia     a. 01/2013 in setting of hyperkalemia requiring cardioversion.  . Cardiac amyloidosis   . Hypotension   . RBBB   . Hypokalemia   . Family history of anesthesia complication     "daughter and granddaughter have rash and qthing else bad; probably from the paternal side" (08/18/2013)  . CHF (congestive heart failure)   . Anemia   . GERD (gastroesophageal reflux disease)     "dx'd; think it was related to the famalial amyloidosis" (08/18/2013)  . Arthritis     "mild" (08/18/2013)  . Kidney stone     R ureteral stone  . Chronic kidney disease (CKD), stage III (moderate)     Past Surgical History  Procedure Laterality Date  . Excisional hemorrhoidectomy  2002  . Carpal tunnel release Right 2005  . Cysto/ bladder bx and fulgeration  02-21-2007  . Right sural nerve bx/ right gastrocemius muscle bx  09-25-2009  . Appendectomy  1961  . Extracorporeal shock wave lithotripsy Right 08-10-2011  . Cystoscopy  09/2011  . Tonsillectomy  1940's    child  . Cystoscopy with ureteroscopy  10/28/2011    Procedure: CYSTOSCOPY WITH URETEROSCOPY;  Surgeon: Milford Cage, MD;  Location: [facility];  Service: Urology;  Laterality: Right;  . Cataract extraction w/ intraocular lens  implant, bilateral Bilateral 2013  . Breast biopsy Right 1992  . Breast biopsy Left     "after the right; not sure when" (08/18/2013)  . Cardiac catheterization      ROS: As stated in the HPI and negative for all other systems.   PHYSICAL EXAM BP 78/46  Pulse 94  Ht 5\' 5"  (1.651 m)  Wt 133 lb 1.9 oz (60.383 kg)  BMI 22.15 kg/m2  LMP 08/04/1991 GEN: No distress, frail appearing NECK: Jugular venous distention to the jaw at 90 degrees, waveform within normal limits, carotid upstroke brisk and symmetric, no bruits, no thyromegaly  LUNGS: Clear to auscultation bilaterally  BACK: No CVA  tenderness  CHEST: Unremarkable  HEART: S1 and S2 within normal limits, no S3, no clicks, no rubs, no murmurs, irregular  ABD: Positive bowel sounds normal in frequency in pitch, no bruits, no rebound, no guarding, unable to assess midline mass or bruit with the patient seated.  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 It is becoming increasingly more difficult to keep Mrs. Gruenhagen out of the hospital.  She needs home health nursing. She agrees to try to let us arrange this through Medicare. I also mentioned hospice nursing. This was mentioned to the patient during hospitalization in July. However, she says she's never heard anything about hospice or palliative care. She became emotional today. It is clear as I have stated in previous notes that the patient would not be ready to accept any end-of-life care. Unfortunately there is no change in therapy which will help to keep this patient out of the hospital. She needs daily weights. She needs strict compliance with salt and fluid restriction. She needs to be keeping her legs elevated. She needs frequent lab draws. All of this will be facilitated by home health nursing and even more so by hospice nursing. We will continue to address these issues. Today I will increase her Demadex to twice a day and she will get a basic metabolic profile on Friday.  Atrial fibrillation  She tolerates this rhythm She is tolerating anticoagulation.   Hypotension  She will continue with the midodrine as prescribed.  This limits our therapies.  CKD  We are following this with frequent labs as above.  LEG ULCERS She continues to have these followed by  wound care specialist.

## 2013-08-22 NOTE — Telephone Encounter (Signed)
Follow up    Pt's daughter called back abut the order for Hospis please make, and home health care as well.     Please call Vernona Rieger daughter  with any question.

## 2013-08-23 ENCOUNTER — Telehealth: Payer: Self-pay | Admitting: Cardiology

## 2013-08-23 NOTE — Telephone Encounter (Signed)
Follow up    Pt's son in law called to say pt has an Interview with [facility] on 08/25/13 please fax ref to # [phone].   If any question please call.

## 2013-08-23 NOTE — Telephone Encounter (Signed)
Returned call to patient's daughter Vernona Rieger she stated her mother decided she will talk to Hospice.Hospice called spoke to [facility] they can send out a nurse this Sunday 08/27/13 to evaluate patient.

## 2013-08-25 ENCOUNTER — Other Ambulatory Visit (INDEPENDENT_AMBULATORY_CARE_PROVIDER_SITE_OTHER): Payer: Medicare Other

## 2013-08-25 DIAGNOSIS — E876 Hypokalemia: Secondary | ICD-10-CM

## 2013-08-25 DIAGNOSIS — Z79899 Other long term (current) drug therapy: Secondary | ICD-10-CM

## 2013-08-25 LAB — BASIC METABOLIC PANEL
BUN: 48 mg/dL — ABNORMAL HIGH (ref 6–23)
CALCIUM: 8.5 mg/dL (ref 8.4–10.5)
CHLORIDE: 88 meq/L — AB (ref 96–112)
CO2: 38 meq/L — AB (ref 19–32)
Creatinine, Ser: 1.2 mg/dL (ref 0.4–1.2)
GFR: 48.06 mL/min — ABNORMAL LOW (ref >60.00)
Glucose, Bld: 125 mg/dL — ABNORMAL HIGH (ref 70–99)
Potassium: 3.2 mEq/L — ABNORMAL LOW (ref 3.5–5.1)
SODIUM: 138 meq/L (ref 135–145)

## 2013-08-27 ENCOUNTER — Telehealth: Payer: Self-pay | Admitting: Physician Assistant

## 2013-08-27 NOTE — Telephone Encounter (Signed)
Patient called answering service on Sunday. Complex PMH of amyloid cardiomyopathy. She has not yet been accepting of hospice or palliative care services despite it becoming increasingly more difficult to keep her out of the hospital. There are notes however that there might be plans in place to pursue this. She called because she is concerned about being too dry. Weight keeps dropping - 125 last week, 116 on Friday, 113 yesterday, 111 today. She keeps track of her fluid status and feels she needs to scale back on diuretic. Reviewed labs from 1/2 where she was somewhat hypokalemic. She is taking 60meq BID torsemide and 30meq BID KCl at home (per her report since hospital discharge). In the past when diuretics are held she goes back into CHF. I advised to hold one dose of Torsemide today and continue the KCl 30meq BID today given hypokalemia the other day. Given history of significant hyperkalemia with subsequent VT, I did not want to press aggressive repletion. I advised her call the office tomorrow to let us know how she is feeling. She likely needs recheck BMET at some point this week as well. Will forward to Dr. Antoine PocheHochrein and his nurse. Dayna Dunn PA-C

## 2013-08-28 ENCOUNTER — Ambulatory Visit: Payer: Medicare Other | Admitting: Podiatry

## 2013-08-28 ENCOUNTER — Telehealth: Payer: Self-pay | Admitting: *Deleted

## 2013-08-28 NOTE — Telephone Encounter (Signed)
Please note, while I have been pt's PCP, her hospice dx and CHF is being actively managed by [facility](Hochrein) with recent concerns about vol status and potassium levels - labs ?pending to help advise on med dose I will forward this message to him Thanks

## 2013-08-28 NOTE — Telephone Encounter (Signed)
She will need a BMET today.

## 2013-08-28 NOTE — Telephone Encounter (Signed)
Hospice RN called states during her assessment today pt had cough, which sounded "wet in her chest".  Please advise

## 2013-08-29 ENCOUNTER — Telehealth: Payer: Self-pay | Admitting: *Deleted

## 2013-08-29 ENCOUNTER — Telehealth: Payer: Self-pay | Admitting: Nurse Practitioner

## 2013-08-29 ENCOUNTER — Telehealth: Payer: Self-pay | Admitting: General Practice

## 2013-08-29 ENCOUNTER — Other Ambulatory Visit: Payer: Self-pay | Admitting: *Deleted

## 2013-08-29 MED ORDER — WARFARIN SODIUM 2.5 MG PO TABS: ORAL_TABLET | ORAL | Status: AC

## 2013-08-29 NOTE — Telephone Encounter (Signed)
New problem   Stated pt's bmp/inr wasn't drawn correctly and want to know if it could be done at pt's visit w/Lori tomorrow. Please advise.

## 2013-08-29 NOTE — Telephone Encounter (Signed)
Caregiver phoned requesting cough syrup.  Per Leschber's previous note, I referred her to her cardiologist.

## 2013-08-29 NOTE — Telephone Encounter (Signed)
S/w pt and care giver Maralyn SagoSarah, pt stated had labs drawn at our office  and was suppose to repeat labs with Hospice, labs were drawn on 1/5 with Hospice, pt waiting for results. I called Hospice and s/w Adelia and stated to fax over results to our office. Pt's nurse through Hospice is Atilano InaRinyeje Ijola @ [phone] Delorise Royalsdelia was inquiring about results. I stated Lawson FiscalLori is seeing pt tomorrow.

## 2013-08-29 NOTE — Telephone Encounter (Signed)
Attempted transitional care call.  No answer/No VM.  Will try again.

## 2013-08-29 NOTE — Telephone Encounter (Signed)
New Problem:  Pt's caregiver, Maralyn SagoSarah, is calling in to request someone call in cough syrup for the pt. Maralyn SagoSarah would like a call back.

## 2013-08-29 NOTE — Telephone Encounter (Signed)
Will just discuss tomorrow. Can use plain Robitussin prn.

## 2013-08-29 NOTE — Telephone Encounter (Signed)
S/w pt is agreeable to plan to use plain robotussin prn  Until ov tomorrow but looks like issued was already addressed with Dr. Felicity CoyerLeschber and sent to Dr. Antoine PocheHochrein

## 2013-08-30 ENCOUNTER — Telehealth: Payer: Self-pay | Admitting: Nurse Practitioner

## 2013-08-30 ENCOUNTER — Telehealth: Payer: Self-pay | Admitting: Cardiology

## 2013-08-30 ENCOUNTER — Ambulatory Visit (INDEPENDENT_AMBULATORY_CARE_PROVIDER_SITE_OTHER): Payer: Medicare Other | Admitting: Cardiovascular Disease

## 2013-08-30 ENCOUNTER — Ambulatory Visit (INDEPENDENT_AMBULATORY_CARE_PROVIDER_SITE_OTHER): Admitting: Nurse Practitioner

## 2013-08-30 ENCOUNTER — Encounter: Payer: Self-pay | Admitting: Nurse Practitioner

## 2013-08-30 VITALS — BP 130/70 | HR 90 | Ht 65.0 in

## 2013-08-30 DIAGNOSIS — I5022 Chronic systolic (congestive) heart failure: Secondary | ICD-10-CM

## 2013-08-30 DIAGNOSIS — Z7901 Long term (current) use of anticoagulants: Secondary | ICD-10-CM

## 2013-08-30 DIAGNOSIS — I498 Other specified cardiac arrhythmias: Secondary | ICD-10-CM

## 2013-08-30 LAB — BASIC METABOLIC PANEL
BUN: 53 mg/dL — ABNORMAL HIGH (ref 6–23)
CO2: 43 mEq/L — ABNORMAL HIGH (ref 19–32)
Calcium: 8.8 mg/dL (ref 8.4–10.5)
Chloride: 85 mEq/L — ABNORMAL LOW (ref 96–112)
Creatinine, Ser: 1.2 mg/dL (ref 0.4–1.2)
GFR: 45.79 mL/min — ABNORMAL LOW (ref >60.00)
Glucose, Bld: 110 mg/dL — ABNORMAL HIGH (ref 70–99)
Potassium: 3.5 mEq/L (ref 3.5–5.1)
Sodium: 138 mEq/L (ref 135–145)

## 2013-08-30 LAB — PROTIME-INR
INR: 4.8 ratio — ABNORMAL HIGH (ref 0.8–1.0)
Prothrombin Time: 48.9 s — ABNORMAL HIGH (ref 10.2–12.4)

## 2013-08-30 NOTE — Telephone Encounter (Signed)
Pts son called from [place] this evening to clarify her potassium level, which was drawn earlier today.  His wife was apparently called earlier and was under the belief that her K+ returned at 7.  I pulled up her labs and clarified that K+ is in fact 3.5  He was grateful for call back.  Lab Results  Component Value Date   CREATININE 1.2 08/30/2013   BUN 53* 08/30/2013   NA 138 08/30/2013   K 3.5 08/30/2013   CL 85* 08/30/2013   CO2 43* 08/30/2013

## 2013-08-30 NOTE — Patient Instructions (Signed)
I will speak with your son and your daughter.   We will call you with your labs later today

## 2013-08-30 NOTE — Telephone Encounter (Signed)
[facility] to [facility] fax number which is [phone] to fax Lori's office note over from pt's visit today Adelia stated to put attention to Lynnell Chadnyeje Ijaola sent over today

## 2013-08-30 NOTE — Telephone Encounter (Signed)
New message        Pt returning lori's phone call

## 2013-08-30 NOTE — Progress Notes (Addendum)
Miranda [name] Date of Birth: 14-Aug-1941 Medical Record #[id number]  History of Present Illness: Miranda [name] is seen back today for a follow up visit. Seen for Dr. Antoine Poche. 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 Was admitted back last fall of 2014 with volume overload. 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 She is on Midodrine for hypotension. Nephrology does not wish to use Zaroxolyn. Overall prognosis has been deemed poor - has had prior discussion for palliative care but wishes to stay a full code. ICD implant not felt to be appropriate.   She has continued to deteriorate and has been back in and out of the hospital with CHF. Saw Dr. Antoine Poche last week - had a discussion regarding end of life care and now on Hospice services.   Comes back today. Here alone (but someone drove her here).  She now has around the clock care. She is doing quite poorly. Can't eat. Early satiety. No more swelling but remains short of breath. Not able to weigh here today - not sure if her last weight from last week was even correct. She says she weighs 111 at home. Tells me she is not ready to die yet - still has things she needs to get taken care of. Her son here in [place] has the flu. Daughter is in [place]. Was hardly able to get her today - needed 2 people to get in the car. She had lab drawn by Hospice at the beginning of the week - the sample says it was hemolyzed - potassium was 7.2 - this was not called to [place] that I am aware of.  Current Outpatient Prescriptions  Medication Sig Dispense Refill  . acetaminophen (TYLENOL) 500 MG tablet Take 500 mg by mouth daily as needed for pain.       . Alpha-D-Galactosidase (BEANO PO) Take 1 tablet by mouth daily  as needed (gas).      . calcitRIOL (ROCALTROL) 0.25 MCG capsule Take 0.25 mcg by mouth daily.      . cyanocobalamin (,VITAMIN B-12,) 1000 MCG/ML injection Inject 1,000 mcg into the muscle every 30 (thirty) days. Last dose 01/16/13      . cyclobenzaprine (FLEXERIL) 5 MG tablet Take 5 mg by mouth at bedtime as needed for muscle spasms.       Marland Kitchen docusate sodium (STOOL SOFTENER) 100 MG capsule Take 100 mg by mouth daily as needed for constipation.       . fluticasone (FLONASE) 50 MCG/ACT nasal spray Place 2 sprays into the nose as needed for allergies or rhinitis.       Marland Kitchen gabapentin (NEURONTIN) 100 MG capsule Take 100-200 mg by mouth 2 (two) times daily. Takes 200 mg in the AM and 100 mg at dinner time.  Sometimes takes all 300 mg together depending on stomach pain. Pt also states that she takes up to 900 mg if needed.      . loperamide (IMODIUM) 2 MG capsule Take 2 mg by mouth 2 (two) times daily.       . midodrine (PROAMATINE) 5 MG tablet Take 5 mg by mouth 3 (three) times daily with meals.       . Multiple Vitamin (MULTIVITAMIN WITH MINERALS) TABS Take 1 tablet by mouth as needed.       Marland Kitchen  Potassium Chloride ER 20 MEQ TBCR Take 20 meq po BID  120 tablet  2  . SIMETHICONE PO Take 1 tablet by mouth daily as needed (for gas). Simethicone Extra Strength - over the counter - pt not sure of dose      . torsemide (DEMADEX) 20 MG tablet Take 3 tablets (60 mg total) by mouth 2 (two) times daily.  180 tablet  3  . traMADol (ULTRAM) 50 MG tablet Take 50 mg by mouth every 12 (twelve) hours as needed for moderate pain.      Marland Kitchen warfarin (COUMADIN) 2.5 MG tablet Take as directed by coumadin clinic  30 tablet  3   No current facility-administered medications for this visit.    Allergies  Allergen Reactions  . Statins Other (See Comments)    Leg cramps and restlessness    Past Medical History  Diagnosis Date  . Lactose intolerance   . IBS (irritable bowel syndrome)   . Dyslipidemia   . 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 Coronary artery disease     a. Cardiac Cath in [place] 2008: nonobstructive coronary disease at the time with the left main 20% stenosis, the LAD luminal irregularity 40% stenosis, the ramus intermediate 95% ostial stenosis. The right coronary artery is dominant and had 40% proximal, 25% mid stenosis. She was treated medically.  . Carotid stenosis, bilateral     a. Dopp 05/2012: 0-39% RICA, 40-59% LICA..  . Cardiomyopathy secondary     EF 25% echo 2013  . PAF (paroxysmal atrial fibrillation)     chronic anticoag  . Spinal stenosis   . Neuromuscular disorder     neuropathy d/t amyloidosis  . ANXIETY   . DEPRESSION   . Branch retinal vein occlusion of left eye     12/2011 event - vision improving - Following with optho and retinal specialist for same  . Combined systolic and diastolic heart failure     a. Due to cardiac amyloidosis - EF 20-25%.   Marland Kitchen Hyperkalemia     a. Adm 01/2013 a/w VT.  Marland Kitchen Ventricular tachyarrhythmia     a. 01/2013 in setting of hyperkalemia requiring cardioversion.  . Cardiac amyloidosis   . Hypotension   . RBBB   . Hypokalemia   . Family history of anesthesia complication     "daughter and granddaughter have rash and qthing else bad; probably from the paternal side" (08/18/2013)  . CHF (congestive heart failure)   . Anemia   . GERD (gastroesophageal reflux disease)     "dx'd; think it was related to the famalial amyloidosis" (08/18/2013)  . Arthritis     "mild" (08/18/2013)  . Kidney stone     R ureteral stone  . Chronic kidney disease (CKD), stage III (moderate)     Past Surgical History  Procedure Laterality Date  . Excisional hemorrhoidectomy  2002  . Carpal tunnel release Right 2005  . Cysto/ bladder bx and fulgeration  02-21-2007  . Right sural nerve bx/ right gastrocemius muscle bx  09-25-2009  . Appendectomy  1961  . Extracorporeal shock wave lithotripsy  Right 08-10-2011  . Cystoscopy  09/2011  . Tonsillectomy  1940's    child  . Cystoscopy with ureteroscopy  10/28/2011    Procedure: CYSTOSCOPY WITH URETEROSCOPY;  Surgeon: Milford Cage, MD;  Location: [facility];  Service: Urology;  Laterality: Right;  . Cataract extraction w/ intraocular lens  implant, bilateral Bilateral 2013  . Breast biopsy Right 1992  . Breast biopsy Left     "after the right; not sure when" (08/18/2013)  . Cardiac catheterization      History  Smoking status  . Former Smoker -- 3.00 packs/day for 20 years  . Types: Cigarettes  . Quit date: 10/28/1990  Smokeless tobacco  . Never Used    History  Alcohol Use  . Yes    Comment: 08/18/2013 "glass of wine/yr"    Family History  Problem Relation Age of Onset  . Heart disease Mother     Heart failure at a later age  . Arthritis Mother   . Arthritis Father   . Heart disease Father   . COPD Other   . Heart disease      Heart failure at a later age    Review of Systems: The review of systems is per the HPI.  All other systems were reviewed and are negative.  Physical Exam: BP 130/70  Pulse 90  Ht 5\' 5"  (1.651 m)  SpO2 97%  LMP 08/04/1991 Oxygen sat is 91% by me. Patient is quite chronically ill but in no acute distress. Looks more lethargic. Not able to weigh today. Skin is warm and dry. Color is quite sallow.  HEENT is unremarkable. Normocephalic/atraumatic. PERRL. Sclera are nonicteric. Neck is supple. No masses. No JVD. Lungs are clear. Cardiac exam shows a regular rate and rhythm. Abdomen is soft. Extremities are without edema. Gait is not tested. Her legs are wrapped.   Wt Readings from Last 3 Encounters:  08/22/13 133 lb 1.9 oz (60.383 kg)  08/18/13 126 lb 1.7 oz (57.2 kg)  07/17/13 129 lb (58.514 kg)     LABORATORY DATA: Stat BMET and INR pending  Lab Results  Component Value Date   WBC 8.2 08/19/2013   HGB 14.1 08/19/2013   HCT 42.8 08/19/2013   PLT 177 08/19/2013   GLUCOSE  125* 08/25/2013   CHOL 174 06/08/2012   TRIG 78.0 06/08/2012   HDL 59.80 06/08/2012   LDLCALC 99 06/08/2012   ALT 55* 08/18/2013   AST 59* 08/18/2013   NA 138 08/25/2013   K 3.2* 08/25/2013   CL 88* 08/25/2013   CREATININE 1.2 08/25/2013   BUN 48* 08/25/2013   CO2 38* 08/25/2013   TSH 2.855 06/04/2013   INR 1.6 08/22/2013     Assessment / Plan: End stage CHF - she looks worse to me - now with Hospice services - will update her son and daughter - not clear to me how much she has shared with them. Will try to repeat her labs here in the office today. Her overall prognosis is quite poor. I think she is actively dying at this time. May need to consider transfer to [facility]. Will talk with Hospice as well. She was not given a return visit here.   I have updated the case manager at hospice - they will be arranging for oxygen therapy and will use ativan prn. I have left a message for her son Rosanne Ashing [phone]) to call me back for discussion.   Patient is agreeable to this plan and will call if any problems develop in the interim.   Rosalio Macadamia, RN, ANP-C [facility] [address] [place], [place]  [id number] [phone]   Addendum:  I have spoken with Ceira daughter - Vernona Rieger - I have updated her as to her mom's condition - she  wanted us to know that she was quite appreciative of our care.

## 2013-08-31 ENCOUNTER — Telehealth: Payer: Self-pay | Admitting: Nurse Practitioner

## 2013-08-31 NOTE — Telephone Encounter (Signed)
New problem    Need a verbal order for DNR. Please call.

## 2013-08-31 NOTE — Telephone Encounter (Signed)
Hospice RN requesting DNR order from MD. Routed to Dr. Antoine PocheHochrein personally. He will review patient's chart and OV from 08/30/13 and return Hospice RN phone call/request.

## 2013-09-04 ENCOUNTER — Telehealth: Payer: Self-pay | Admitting: *Deleted

## 2013-09-04 ENCOUNTER — Ambulatory Visit (INDEPENDENT_AMBULATORY_CARE_PROVIDER_SITE_OTHER): Payer: Medicare Other | Admitting: Pharmacist

## 2013-09-04 DIAGNOSIS — Z7901 Long term (current) use of anticoagulants: Secondary | ICD-10-CM

## 2013-09-04 DIAGNOSIS — I498 Other specified cardiac arrhythmias: Secondary | ICD-10-CM

## 2013-09-04 LAB — POCT INR: INR: 1.5

## 2013-09-04 NOTE — Telephone Encounter (Signed)
Received call from [place]ajo[place]je (home health nurse from Hospice) with report of labs done today. Need coumadin dosing instructions. PT today 18.6, INR 1.5.  Call back number is [phone]. I told Onyeje I would forward message to coumadin clinic

## 2013-09-04 NOTE — Telephone Encounter (Signed)
Spoke with Onyeje with Hospice.  See anti-coag note for details.

## 2013-09-05 ENCOUNTER — Telehealth: Payer: Self-pay | Admitting: Cardiology

## 2013-09-05 ENCOUNTER — Telehealth: Payer: Self-pay | Admitting: Nurse Practitioner

## 2013-09-05 NOTE — Telephone Encounter (Signed)
No answer, will call back. (3:30 pm)  Spoke to Goodrich CorporationJim Meckes (3:43 pm). He states he is calling back to be pro-active and make sure that we have both phone numbers that he can be reached at today. Also he states that they would like to schedule appt with Dr. Antoine PocheHochrein as soon as possible. Since Dr. Antoine PocheHochrein has no current openings in January, he was informed that Dr. Antoine PocheHochrein would have to determine if patient can be worked in sooner. Informed Reece LevyJim Eastburn that messages had been sent to Dr. Antoine PocheHochrein and we are awaiting his reply. Family will be called as soon as we hear response from Dr. Antoine PocheHochrein.  [name], NP, called Reece LevyJim Vest to speak with him regarding patient's status (4:07 pm).  [name], NP, also spoke with Dr. Antoine PocheHochrein regarding this patient at this time.   Spoke to Goodrich CorporationJim Philley at 4:24 pm to determine if he had any further questions or need to speak with Dr. Antoine PocheHochrein. He states he is discussing status with his sister at this time and he will call us back shortly should they have further questions.

## 2013-09-05 NOTE — Telephone Encounter (Signed)
Follow Up  Pt calling to follow Up// Requesting a call back today. Please call

## 2013-09-05 NOTE — Telephone Encounter (Signed)
Spoke to Goodrich CorporationJim Krise, [place], and daughter-in-law. He states that the patient needs to seen by Dr. Antoine PocheHochrein, as they have several questions and the patient is having seizures that they have been told are related to Cardiac issues. They are also attempting to contact [facility] Modesto CharonWong, Neurology[facility]. States patient has been documented having three episodes of seizures/periods of unresponsiveness. She is taking Neurontin four times daily. Family states they have concerns regarding care from last OV on 08/30/13 with [name], NP. Family feels that they need to speak directly to Dr. Antoine PocheHochrein today. Routed to Dr. Antoine PocheHochrein.

## 2013-09-05 NOTE — Telephone Encounter (Signed)
Phone call to Miranda [name] - Ameliya' son - he is requesting that his mother be brought in "for treatment" - wanting every possible treatment needed. Does not sound like he understands the severity of her issues and the prognosis associated. Tells me that she is having spells of unresponsiveness for 30 to 40 seconds at a time.   I have talked with Dr. Antoine PocheHochrein who has suggested that if they are wanting any type of active treatment - then they would need to bring her to the hospital. There is no outpatient treatment that is felt to benefit her at this time.   I have then talked with Vernona RiegerLaura - the daughter - she is with Britta MccreedyBarbara - she sounds like she is wanting to keep her mom at home and focus on comfort care. I have reiterated to her Dr. Jenene SlickerHochrein's advice. She does seem to understand her mother's condition and poor prognosis.   Rosalio MacadamiaLori C. Shantana Christon, RN, ANP-C [facility] [address] [place], [place]  [id number] [phone]

## 2013-09-05 NOTE — Telephone Encounter (Signed)
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  They request a call back today.

## 2013-09-05 NOTE — Telephone Encounter (Signed)
Phone number busy. Unable to leave a message. Will forward request to Dr. Antoine PocheHochrein and try calling back again this afternoon.

## 2013-09-06 NOTE — Telephone Encounter (Signed)
I spoke with the son.  I spoke with Hospice the family.  They apparently decided to stay at home rather than coming to the hospital.  I did order a BMET and INR through Hospice.

## 2013-09-07 ENCOUNTER — Encounter (HOSPITAL_BASED_OUTPATIENT_CLINIC_OR_DEPARTMENT_OTHER): Attending: Internal Medicine

## 2013-09-07 ENCOUNTER — Encounter: Payer: Self-pay | Admitting: Cardiology

## 2013-09-07 DIAGNOSIS — I872 Venous insufficiency (chronic) (peripheral): Secondary | ICD-10-CM | POA: Insufficient documentation

## 2013-09-07 DIAGNOSIS — L97909 Non-pressure chronic ulcer of unspecified part of unspecified lower leg with unspecified severity: Principal | ICD-10-CM | POA: Insufficient documentation

## 2013-09-07 DIAGNOSIS — L97809 Non-pressure chronic ulcer of other part of unspecified lower leg with unspecified severity: Secondary | ICD-10-CM | POA: Insufficient documentation

## 2013-09-07 DIAGNOSIS — I87319 Chronic venous hypertension (idiopathic) with ulcer of unspecified lower extremity: Secondary | ICD-10-CM | POA: Insufficient documentation

## 2013-09-08 ENCOUNTER — Telehealth: Payer: Self-pay | Admitting: Cardiology

## 2013-09-08 NOTE — Telephone Encounter (Signed)
New message     Phone call from solsis lab saying pts co2 above 45

## 2013-09-11 ENCOUNTER — Ambulatory Visit (INDEPENDENT_AMBULATORY_CARE_PROVIDER_SITE_OTHER): Payer: Medicare Other | Admitting: Pharmacist

## 2013-09-11 DIAGNOSIS — I498 Other specified cardiac arrhythmias: Secondary | ICD-10-CM

## 2013-09-11 DIAGNOSIS — Z7901 Long term (current) use of anticoagulants: Secondary | ICD-10-CM

## 2013-09-11 LAB — POCT INR: INR: 1.3

## 2013-09-11 NOTE — Telephone Encounter (Signed)
Jan - reported PT/INR today is 15.6/1.3.  Aware I will forward to Coumadin Clinic for orders and any changes in warfarin.

## 2013-09-11 NOTE — Telephone Encounter (Signed)
INR addressed by Coumadin clinic.  Orders given to Hospice.  Will forward note back to Dr. Antoine PocheHochrein and Elita QuickPam as CO2 from 1/16 has not been addressed.

## 2013-09-11 NOTE — Telephone Encounter (Signed)
New message  Home health in the home now.   Pt/ inr was drawn Thursday.     Should a repeat be done today.

## 2013-09-12 NOTE — Telephone Encounter (Signed)
Addressed on results tab.

## 2013-09-18 ENCOUNTER — Ambulatory Visit (INDEPENDENT_AMBULATORY_CARE_PROVIDER_SITE_OTHER): Payer: Medicare Other | Admitting: Pharmacist

## 2013-09-18 DIAGNOSIS — Z7901 Long term (current) use of anticoagulants: Secondary | ICD-10-CM

## 2013-09-18 DIAGNOSIS — I498 Other specified cardiac arrhythmias: Secondary | ICD-10-CM

## 2013-09-18 LAB — POCT INR: INR: 1.4

## 2013-09-21 ENCOUNTER — Telehealth: Payer: Self-pay | Admitting: Cardiology

## 2013-09-21 NOTE — Telephone Encounter (Signed)
New Message  Hospice Nurse Miranda [name] called states that since the Pt left the hospital they have taken BMP. She states that the Pt's PTINR is completed every monday but will the BMP be completed every week as well or wait for orders// please advise.

## 2013-09-21 NOTE — Telephone Encounter (Signed)
Hospice nurse Annah calls to clarify that pt does not need a BMP drawn every week along with weekly PT/INRs. I told her I did not think pt needed a weekly BMP but I would forward this to [name] NP for clarification  Mylo Redebbie Sharniece Gibbon RN

## 2013-09-22 NOTE — Telephone Encounter (Signed)
S/w hospice, received home health nurse Miranda [name], vm @ [phone]  let pt know and nurse that Miranda [name] is not in favor of drawing anymore BMP's. Also s/w Ms. Miranda [name] wanted to know when she will be seeing Dr. Antoine PocheHochrein again Miranda [name] stated pt will not be seeing Dr. Antoine PocheHochrein.

## 2013-09-22 NOTE — Telephone Encounter (Signed)
I would favor NOT drawing any more BMPs

## 2013-09-25 ENCOUNTER — Ambulatory Visit: Payer: Medicare Other | Admitting: Internal Medicine

## 2013-09-25 ENCOUNTER — Ambulatory Visit (INDEPENDENT_AMBULATORY_CARE_PROVIDER_SITE_OTHER): Payer: Medicare Other | Admitting: Pharmacist

## 2013-09-25 ENCOUNTER — Telehealth: Payer: Self-pay | Admitting: *Deleted

## 2013-09-25 ENCOUNTER — Telehealth: Payer: Self-pay | Admitting: Cardiology

## 2013-09-25 ENCOUNTER — Encounter: Payer: Self-pay | Admitting: Cardiology

## 2013-09-25 DIAGNOSIS — Z0289 Encounter for other administrative examinations: Secondary | ICD-10-CM

## 2013-09-25 DIAGNOSIS — Z7901 Long term (current) use of anticoagulants: Secondary | ICD-10-CM

## 2013-09-25 DIAGNOSIS — I498 Other specified cardiac arrhythmias: Secondary | ICD-10-CM

## 2013-09-25 LAB — POCT INR: INR: 1.4

## 2013-09-25 MED ORDER — CYANOCOBALAMIN 1000 MCG/ML IJ SOLN: 1000.0000 ug | INTRAMUSCULAR | Status: AC

## 2013-09-25 MED ORDER — "SYRINGE/NEEDLE (DISP) 25G X 5/8"" 1 ML MISC": Status: AC

## 2013-09-25 NOTE — Telephone Encounter (Signed)
New message    Daughter in law is asking for lab work to be drawn at the home b/c hospice is unable to get blood.  Suggestion.

## 2013-09-25 NOTE — Telephone Encounter (Signed)
Spoke with daughter in law who is concerned they are not receiving results for lab work.  Advised we have not received any BMP results since around 1/16.  According tot documentation Miranda FredricksonLori Gerhardt, NP did say pt no longer needed them however Dr Miranda [name] gave a verbal order for her to have BMP every week.  Pt is on Torsemide daily and not taking K+ supplement.  Last potassium level was low and this must be repeated ASAP. Called and left a message for the [facility] Miranda [name] [phone].   Received a call back from Miranda [name] - stating the order had been cancelled and that the last time they tried to draw her blood it was hemolyzed by the time they got it to the lab.  Advised pt is too weak to be travelling to the office for the blood draw.  She will see what else they can do.

## 2013-09-25 NOTE — Telephone Encounter (Signed)
New Message  Pt daughter in law called to discuss having the pt's labs drawn// Please call

## 2013-09-25 NOTE — Telephone Encounter (Signed)
Left msg on vm stating since being discharge from hospital she has been place with Hospice. Haven't been getting her B12 injections due to not able to get out. Wanting to get an rx for her b12 sent to her pharmacy and she will have the hospice nurse to administer. Called pt back inform her will send to her pharamcy,,,/lmb

## 2013-09-25 NOTE — Telephone Encounter (Signed)
Left message that I called - will try again.  Per Dr Antoine PocheHochrein - OK for verbal DNR.  Family wants to continue blood draws and he will adjust Furosemide or Torsemide based on these results.  Pt will need to be brought into office to lab work if Hospice nurse unable to obtain.

## 2013-09-27 ENCOUNTER — Encounter: Payer: Self-pay | Admitting: *Deleted

## 2013-09-27 ENCOUNTER — Telehealth: Payer: Self-pay | Admitting: Cardiology

## 2013-09-27 NOTE — Telephone Encounter (Signed)
Please see prior telephone note 

## 2013-09-27 NOTE — Telephone Encounter (Signed)
Please see phone note from 2/2 - verbal DNR order given.  Pt too weak to come into the office and will need Hospice to draw lab and bring to the office.

## 2013-09-27 NOTE — Telephone Encounter (Signed)
Pt had multiple questions - wants a new MyChart Access Code since hers has expired.  Letter created and will be mailed to her home address.  She needs a temporary Handicap placid application for her care giver to use for if and when she does have to take her somewhere.  This was printed - completed and will be mailed to pt home address.  She needs a letter for [employer] but has paperwork at her home with specific information required and will have someone bring it to the office so that this can be completed for her.  (letter is to inform [employer] in case of power outage that she is a Adult nursepriority customer)    Pt has been attempting to find a lab that will come to her home to draw her blood work.  Advised her the Hospice nurse should be able to draw her blood and if she can't she will need to have another nurse from the agency come out to draw it.  I spoke with the Hospice nurse on Monday and will call her back today to discuss further.

## 2013-09-27 NOTE — Telephone Encounter (Signed)
New message     Talk to Miranda [name]--pt has a question about her medication.

## 2013-09-29 NOTE — Telephone Encounter (Signed)
New message   C/O cough really bad . Dark brownish , thick . Can they get antibiotic called in to pharmacy. The patient did not sleep well last night.

## 2013-09-29 NOTE — Telephone Encounter (Signed)
I spoke with the hospice nurse & she states the pt told her to call Dr. Antoine PocheHochrein. Hospice nurse will get in touch with the Hospice MD about pt cough & dark mucus  Mylo Redebbie Shawnee Higham RN

## 2013-10-02 ENCOUNTER — Other Ambulatory Visit: Payer: Self-pay | Admitting: Internal Medicine

## 2013-10-02 ENCOUNTER — Ambulatory Visit (INDEPENDENT_AMBULATORY_CARE_PROVIDER_SITE_OTHER): Payer: Medicare Other | Admitting: Pharmacist

## 2013-10-02 DIAGNOSIS — Z7901 Long term (current) use of anticoagulants: Secondary | ICD-10-CM

## 2013-10-02 DIAGNOSIS — I498 Other specified cardiac arrhythmias: Secondary | ICD-10-CM

## 2013-10-02 LAB — POCT INR: INR: 1.3

## 2013-10-03 ENCOUNTER — Telehealth: Payer: Self-pay | Admitting: Cardiology

## 2013-10-03 NOTE — Telephone Encounter (Signed)
New message     Pt has some infor regarding her blood thinner

## 2013-10-03 NOTE — Telephone Encounter (Signed)
Returned call to pt, pt states about 1 month ago she switched from name brand Coumadin to generic Warfarin inquiring if this is what is causing the fluctuation in her INR.  Advised pt this can make a slight difference in dosage amounts required, but usually not as big of a difference as she has been experiencing.  Pt reports feeling better and eating better which I told her maybe more what is effecting her INR's.  Historically in the past her dosage has been much higher than what we have had her on the past few weeks.  Pt verbalized understanding.

## 2013-10-03 NOTE — Telephone Encounter (Signed)
Last prescribed 5/13 with 11 refills--last office visit 08/2012--please advise

## 2013-10-04 ENCOUNTER — Other Ambulatory Visit: Payer: Self-pay

## 2013-10-04 ENCOUNTER — Telehealth: Payer: Self-pay | Admitting: Cardiology

## 2013-10-04 MED ORDER — POTASSIUM CHLORIDE ER 20 MEQ PO TBCR: EXTENDED_RELEASE_TABLET | ORAL | Status: DC

## 2013-10-04 NOTE — Telephone Encounter (Signed)
New message  ° ° °Test results  °

## 2013-10-04 NOTE — Telephone Encounter (Signed)
Patient states that Hospice nurse took blood yesterday for BMET. Patient would like to know results. System does not show BMET submitted for yesterday. There are no pending BMET labs noted in the system. Informed patient and advised her to contact her hospice nurse regarding where labs were submitted and when results would be available. Patient stated she will contact us if she is unable to determine any further information.

## 2013-10-05 ENCOUNTER — Encounter (HOSPITAL_BASED_OUTPATIENT_CLINIC_OR_DEPARTMENT_OTHER): Attending: Internal Medicine

## 2013-10-05 ENCOUNTER — Ambulatory Visit (INDEPENDENT_AMBULATORY_CARE_PROVIDER_SITE_OTHER): Admitting: *Deleted

## 2013-10-05 DIAGNOSIS — I87339 Chronic venous hypertension (idiopathic) with ulcer and inflammation of unspecified lower extremity: Secondary | ICD-10-CM | POA: Insufficient documentation

## 2013-10-05 DIAGNOSIS — L97909 Non-pressure chronic ulcer of unspecified part of unspecified lower leg with unspecified severity: Principal | ICD-10-CM

## 2013-10-05 DIAGNOSIS — I10 Essential (primary) hypertension: Secondary | ICD-10-CM

## 2013-10-05 LAB — BASIC METABOLIC PANEL
BUN: 41 mg/dL — ABNORMAL HIGH (ref 6–23)
CALCIUM: 8.7 mg/dL (ref 8.4–10.5)
CO2: 39 meq/L — AB (ref 19–32)
CREATININE: 0.9 mg/dL (ref 0.4–1.2)
Chloride: 92 mEq/L — ABNORMAL LOW (ref 96–112)
GFR: 69.47 mL/min (ref >60.00)
GLUCOSE: 83 mg/dL (ref 70–99)
Potassium: 3.8 mEq/L (ref 3.5–5.1)
Sodium: 137 mEq/L (ref 135–145)

## 2013-10-09 ENCOUNTER — Telehealth: Payer: Self-pay | Admitting: Cardiology

## 2013-10-09 ENCOUNTER — Encounter: Payer: Self-pay | Admitting: Cardiology

## 2013-10-09 ENCOUNTER — Other Ambulatory Visit: Payer: Self-pay | Admitting: *Deleted

## 2013-10-09 MED ORDER — POTASSIUM CHLORIDE ER 10 MEQ PO TBCR: 20.0000 meq | EXTENDED_RELEASE_TABLET | Freq: Two times a day (BID) | ORAL | Status: AC

## 2013-10-09 NOTE — Telephone Encounter (Signed)
Medical Alert Certification/[employer] Completed by Dr.Hochrein and Mailed to Pt 2.16.15/kdm

## 2013-10-09 NOTE — Telephone Encounter (Signed)
CORRECTION:: Medical Alert Certification/[employer] Mailed To  [employer] [address] Medical Alert [place], [place]  [phone] 2.16.15/kdm

## 2013-10-09 NOTE — Telephone Encounter (Signed)
Patient requests potassium tablets due to the being too big.

## 2013-10-16 ENCOUNTER — Encounter: Payer: Self-pay | Admitting: Cardiology

## 2013-10-16 ENCOUNTER — Ambulatory Visit (INDEPENDENT_AMBULATORY_CARE_PROVIDER_SITE_OTHER): Payer: Medicare Other | Admitting: Pharmacist

## 2013-10-16 DIAGNOSIS — I498 Other specified cardiac arrhythmias: Secondary | ICD-10-CM

## 2013-10-16 DIAGNOSIS — Z7901 Long term (current) use of anticoagulants: Secondary | ICD-10-CM

## 2013-10-16 LAB — POCT INR: INR: 2.2

## 2013-10-19 ENCOUNTER — Telehealth: Payer: Self-pay | Admitting: Physician Assistant

## 2013-10-19 DIAGNOSIS — I5022 Chronic systolic (congestive) heart failure: Secondary | ICD-10-CM

## 2013-10-19 MED ORDER — METOLAZONE 2.5 MG PO TABS: ORAL_TABLET | ORAL | Status: AC

## 2013-10-19 NOTE — Telephone Encounter (Signed)
Miranda MarvelBarbara R [name] is a 73 y.o. female with a hx of NICM/cardiac amyloid, systolic CHF.  Last seen by [name], NP in our office 08/2013.  Patient calls in with a 10 lb weight gain over the past 2 weeks and increased abdominal bloating.  She increased her Torsemide 60 bid => 80 bid.  She notes no relief.  Denies significant increase in dyspnea.  No chest pain or syncope.  She calls in requesting a Rx for Metolazone.  She has used this in the past with relief.  She wants to avoid the hospital at all costs.   I have reviewed her chart. There was a note indicating that nephrology did not want her to use Metolazone.  She does not recall having an adverse reaction to this.    10/05/2013: BUN 41*; Chloride 92*; CO2 39*; Creatinine 0.9; Potassium 3.8; Sodium 137  I will send a Rx to her pharmacy for Metolazone 2.5 mg.   She is to take one tab prior to her PM dose of Torsemide (or with AM dose tomorrow if she cannot get to the pharmacy today 2/2 recent snow event). She will take an extra K+ 20 mEq on the day she takes Metolazone. I have advised her to call our answering service (or the office if open) back tomorrow to update us on her symptoms. If she has no improvement she may need to go to the hospital for IV diuresis. She did tell me she has had to take 2 doses of Metolazone in the past.  We can consider a second dose when she calls to update us on her symptoms. She agreed with this plan.  Tereso NewcomerScott Lavren Lewan, PA-C   10/19/2013 11:39 AM

## 2013-10-20 NOTE — Telephone Encounter (Signed)
Spoke with pt, she did not get the metolazone until late last night, she has taken the first dose this morning. She just feels very sleepy this morning. Will make scott weaver pac aware.

## 2013-10-20 NOTE — Telephone Encounter (Addendum)
Have her call back later today if no better.  Otherwise, call back Monday to apprise us of her symptoms. Keep f/u with Dr. Rollene RotundaJames Hochrein 10/27/13. Tereso NewcomerScott Marelin Tat, PA-C   10/20/2013 1:31 PM

## 2013-10-20 NOTE — Telephone Encounter (Signed)
Status: Addendum        Have her call back later today if no better.  Otherwise, call back Monday to apprise us of her symptoms.  Keep f/u with Dr. Rollene RotundaJames Hochrein 10/27/13.  Tereso NewcomerScott Weaver, PA-C  10/20/2013 1:31 PM

## 2013-10-20 NOTE — Telephone Encounter (Signed)
Called pt to review lab results with her.  Also discussed her symptoms.  She thinks the metalozone is "kicking in".  She has taken 2 doses today.  She continues with potassium as ordered.  She wants to avoid going to hospital if at all possible.  "not really" SOB.  Instructed her to call if symptoms worsen or if any new symptoms arise.  Pt verbalizes understanding.

## 2013-10-23 ENCOUNTER — Telehealth: Payer: Self-pay | Admitting: Cardiology

## 2013-10-23 NOTE — Telephone Encounter (Signed)
Lm to cb - OK for dietary consult - 263.0 (mal-nutrition)  and needs to continue having BMP 428.42 V58.69

## 2013-10-23 NOTE — Telephone Encounter (Signed)
New message  Clarification on weekly lab orders & ICD to use?  Hospice nurse needs orders for dietician consult needs verbal or written ? Please call and advise.

## 2013-10-25 ENCOUNTER — Encounter: Payer: Self-pay | Admitting: Cardiology

## 2013-10-26 NOTE — Telephone Encounter (Signed)
Pt. Called to get yesterday's (march 4 th) lab results. Pt is aware that yesterday labs are not available. Pt's labs for Feb 25 th given. Pt would like to be call when we get the latest results.

## 2013-10-26 NOTE — Telephone Encounter (Signed)
Hospice nurse Roney Mans(Maura Barber) called to report [facility] level of 2.8 and BUN 50 with a normal creatinine ( did not give her a level). She is currently taking Demadex 20mg  TID, KCL 10meq 2 tabs BID and no Zaroxolyn. O2 sat with 2L of Oxygen is 97 with a RR of 16. Reviewed above with Dr.Klein (DOD) and he advised that she [facility] the [facility] and increase KCL to 40meq BID for 3 days and repeat BMET on 3/9. I called the Hospice nurse back who is now at her home and when she reviewed the meds with her she has not had any potassium for maybe 2 days but she was not sure. Roney MansMaura Barber (hospice nurse) is planning on calling [name] (the patient's daughter in law) to get clarification on meds because she doses out her pill boxes. Because of above information, I advised Maura to give her 20meq of KCL with dinner tonight and call us back tomorrow with the correct medication list so we can see the what these lab results actually reflect and then discuss with Dr.Hochrein.

## 2013-10-26 NOTE — Telephone Encounter (Signed)
Hospice nurse called to report that [facility] called her to report that her lab work from yesterday showed a CO2 level of 45. She does not have the rest of the BMET available. States it will be sent today or tomorrow. Also she could not get the PT/INR but will obtain this on 3/6.

## 2013-10-26 NOTE — Telephone Encounter (Signed)
New message  ° ° °Patient calling for test results.   °

## 2013-10-27 ENCOUNTER — Ambulatory Visit: Payer: Medicare Other | Admitting: Cardiology

## 2013-10-27 ENCOUNTER — Ambulatory Visit (INDEPENDENT_AMBULATORY_CARE_PROVIDER_SITE_OTHER): Admitting: Cardiology

## 2013-10-27 ENCOUNTER — Encounter: Payer: Self-pay | Admitting: Cardiology

## 2013-10-27 DIAGNOSIS — Z7901 Long term (current) use of anticoagulants: Secondary | ICD-10-CM

## 2013-10-27 DIAGNOSIS — I498 Other specified cardiac arrhythmias: Secondary | ICD-10-CM

## 2013-10-27 LAB — POCT INR: INR: 2

## 2013-10-27 NOTE — Telephone Encounter (Signed)
Follow up    Daughter in law want nurse to check pts hgb with other labs

## 2013-10-27 NOTE — Telephone Encounter (Signed)
Spoke with Miranda [name] (daughter in law) - she is a Engineer, civil (consulting)nurse and fills pt's pill box for her.  Right now pt should be taking 10 MEQ TID based on how she is taking her fluid medications.  Of note, [name] states [name]'s cognition has extremely decreased and she is not sure she is taking her medications as listed.  Also the nursing assistant who was helping her the other day found 3 or 4 pills in the cushion on the chair she was sitting in.  [name] is going to try to have pt's son who is a Dr call Dr Antoine PocheHochrein.

## 2013-10-30 ENCOUNTER — Ambulatory Visit (INDEPENDENT_AMBULATORY_CARE_PROVIDER_SITE_OTHER): Payer: Medicare Other | Admitting: Podiatry

## 2013-10-30 ENCOUNTER — Encounter: Payer: Self-pay | Admitting: Podiatry

## 2013-10-30 VITALS — BP 101/69 | HR 86 | Resp 12

## 2013-10-30 DIAGNOSIS — M79609 Pain in unspecified limb: Secondary | ICD-10-CM

## 2013-10-30 DIAGNOSIS — B351 Tinea unguium: Secondary | ICD-10-CM

## 2013-10-31 ENCOUNTER — Encounter: Payer: Self-pay | Admitting: Cardiology

## 2013-10-31 NOTE — Progress Notes (Signed)
Patient ID: Miranda [name], female   DOB: 03/15/1941, 73 y.o.   MRN: [id number]  Subjective: This [age]year-old white female presents for ongoing debridement of mycotic toenails. She is seated in a wheelchair and unable to transfer to treatment chair. She is wearing compression hose bilaterally.  Objective: Elongated, hypertrophic, discolored toenails x10  Assessment: Symptomatic onychomycoses x10  Plan: Nails x10 are debrided back without a bleeding. Reappoint at three-month intervals

## 2013-11-01 ENCOUNTER — Telehealth: Payer: Self-pay | Admitting: *Deleted

## 2013-11-01 NOTE — Telephone Encounter (Signed)
Pt's C02 was called in as a critical being 45 10/31/2013 which is the same as last week.  Hospice nurse did not have the rest of the BMP at this time.  I called [facility] (acct # for Hospice is [id number]) the results are now in and being faxed to the office. Sharman CrateMaura does express concerns about pt and her ability to continue to administer her own meds.  At this point Britta MccreedyBarbara has a sitter that comes to stay with her during the day however they do not administer medications.  Per Sharman CrateMaura pt could not tell her how much Torsemide and KCL she is taking the other day.  Pt has been adjusting dose based on wt that is now down to 103#.  Pt is complaining of constipation and has been taking Imodium twice a day for some time now as advised by another MD.  Lillia PaulsInstructed Maura pt should take this now more as a prn dose as she has progressively poor PO intake and is much less active leading to the increased possibility of constipation.  She will talk to pt about this.  Received lab results NA  140 K     4.0 CH  87 CO2 45 BS   88 BUN 48 CREA 1.07 CA  9.4   Will take lab to MR to be scanned into EPIC.

## 2013-11-06 NOTE — Telephone Encounter (Signed)
Dr Antoine PocheHochrein discussed pt's care with son.

## 2013-11-07 ENCOUNTER — Telehealth: Payer: Self-pay | Admitting: Pharmacist

## 2013-11-07 LAB — PROTIME-INR: INR: 2.7 — AB (ref 0.9–1.1)

## 2013-11-07 NOTE — Telephone Encounter (Signed)
Spoke to Hospice RN who states PT/INR was scheduled to be due 3/20. However, RN notes that pt was seen to have a small hematoma on her right calf 10 days ago which has progressed to approximately 7 inches with warmth and swelling today. When asked to assess whether she thought it was concerning enough to refer to the PCP, she felt that it might not be that concerning. I have advised to draw a CBC and PT/INR today for safety precautions, which she she and the patient were agreeable to. Will wait for results.    Tyrone NineAlvin B. Artelia Larocheung, PharmD Clinical Pharmacist - Resident Phone: [phone] Pager: [phone] 11/07/2013 2:37 PM

## 2013-11-08 ENCOUNTER — Encounter: Payer: Self-pay | Admitting: Cardiology

## 2013-11-08 ENCOUNTER — Ambulatory Visit (INDEPENDENT_AMBULATORY_CARE_PROVIDER_SITE_OTHER): Admitting: Cardiology

## 2013-11-08 DIAGNOSIS — Z7901 Long term (current) use of anticoagulants: Secondary | ICD-10-CM

## 2013-11-08 DIAGNOSIS — I498 Other specified cardiac arrhythmias: Secondary | ICD-10-CM

## 2013-11-10 NOTE — Telephone Encounter (Signed)
Pt called for recommendations for her coumadin. She has chronic hemorroids and noticed slight bleeding after a large bowel movement, in which she states she was very constipated and did a lot of straining. She denies any further bleeding but was concerned if she should take her coumadin tonight. She denies any other abnormal bleeding and states that it was only a one time occurrence and very mild. I instructed her to continue her dosing as prescribed. If she continues to note any significant bleeding, she is to call back for further instruction. She verbalized understanding.   BRITTAINY SIMMONS, PA-C

## 2013-11-17 ENCOUNTER — Telehealth: Payer: Self-pay | Admitting: Cardiology

## 2013-11-17 NOTE — Telephone Encounter (Signed)
New message     Pt had a BMET this week.  Did we get the results and will Dr Antoine PocheHochrein order another lab?

## 2013-11-17 NOTE — Telephone Encounter (Signed)
May decrease frequency of lab to every two weeks. Pt reports she has taken 1 metolazone for increase wt. Hospice nurse aware.

## 2013-11-20 ENCOUNTER — Ambulatory Visit (INDEPENDENT_AMBULATORY_CARE_PROVIDER_SITE_OTHER): Payer: Medicare Other | Admitting: Interventional Cardiology

## 2013-11-20 ENCOUNTER — Encounter: Payer: Self-pay | Admitting: Cardiology

## 2013-11-20 ENCOUNTER — Telehealth: Payer: Self-pay | Admitting: *Deleted

## 2013-11-20 DIAGNOSIS — Z7901 Long term (current) use of anticoagulants: Secondary | ICD-10-CM

## 2013-11-20 DIAGNOSIS — I498 Other specified cardiac arrhythmias: Secondary | ICD-10-CM

## 2013-11-20 LAB — POCT INR: INR: 2.5

## 2013-11-20 NOTE — Telephone Encounter (Signed)
Per Hospice nurse calling with questions:  Should pt continue wearing her compression hose - she reports having no edema for quite some time and her weights have been stable.  In further discussion pt has been having dizziness over the weekend but BP have been normal for her but are low.  Advised to continue to wear compression hose as ordered and for her to make sure she is taking in all fluids as prescribed (48 oz/day)  Hospice nurse also reports pt has not been taking Torsemide or potassium because she has no edema.  Also over the weekend pt reported lower abdominal pain that radiated to flank.  She has in the past had kidney stones and Hx of UTI without symptoms.  They have a standing order for U/A and C and S.  She will obtain specimen.   She will call back with further concerns.

## 2013-11-22 ENCOUNTER — Telehealth: Payer: Self-pay | Admitting: Cardiology

## 2013-11-22 NOTE — Telephone Encounter (Signed)
New message    Pt forgot to take coumadin last night.  Need advice on dosage.

## 2013-11-22 NOTE — Telephone Encounter (Signed)
Patient notified to take warfarin 3.75 mg x 1 tonight, then resume normal dose tomorrow.

## 2013-12-04 ENCOUNTER — Telehealth: Payer: Self-pay | Admitting: Cardiology

## 2013-12-04 NOTE — Telephone Encounter (Signed)
Spoke with Hospice nurse.  Pt is to have BMP every 2 weeks.  She will be due for this next week.  Per Sharman CrateMaura pt is having increase problems with acid reflux esp at night.  Per Dr Antoine PocheHochrein pt can try Prilosec 20 mg BID.  Maura aware and will obtain medication for the pt.  Of note she reports pt's daughter was here last week and they had a good visit.  She expects pt to be more "down" this week.  Pt's wt is now to 101lbs.  Dr Antoine PocheHochrein is aware.

## 2013-12-04 NOTE — Telephone Encounter (Signed)
New message    1. She having signified regurgitation of secretion. Is there anything Dr. Wilmington LionsHochrien would recommend.    2. Does Dr. Antoine PocheHochrein wants to order another BMET . Last one done last week.

## 2013-12-05 ENCOUNTER — Ambulatory Visit (INDEPENDENT_AMBULATORY_CARE_PROVIDER_SITE_OTHER): Payer: Medicare Other | Admitting: Pharmacist Clinician (PhC)/ Clinical Pharmacy Specialist

## 2013-12-05 DIAGNOSIS — I498 Other specified cardiac arrhythmias: Secondary | ICD-10-CM

## 2013-12-05 DIAGNOSIS — Z7901 Long term (current) use of anticoagulants: Secondary | ICD-10-CM

## 2013-12-05 LAB — POCT INR: INR: 2.1

## 2013-12-11 ENCOUNTER — Encounter: Payer: Self-pay | Admitting: Cardiology

## 2013-12-14 ENCOUNTER — Other Ambulatory Visit: Payer: Self-pay

## 2013-12-14 DIAGNOSIS — Z79899 Other long term (current) drug therapy: Secondary | ICD-10-CM

## 2013-12-14 DIAGNOSIS — E876 Hypokalemia: Secondary | ICD-10-CM

## 2013-12-14 MED ORDER — TORSEMIDE 20 MG PO TABS: 60.0000 mg | ORAL_TABLET | Freq: Two times a day (BID) | ORAL | Status: AC

## 2013-12-25 ENCOUNTER — Ambulatory Visit (INDEPENDENT_AMBULATORY_CARE_PROVIDER_SITE_OTHER): Payer: Medicare Other | Admitting: Pharmacist

## 2013-12-25 DIAGNOSIS — I498 Other specified cardiac arrhythmias: Secondary | ICD-10-CM

## 2013-12-25 DIAGNOSIS — Z7901 Long term (current) use of anticoagulants: Secondary | ICD-10-CM

## 2013-12-25 LAB — POCT INR: INR: 1.5

## 2013-12-28 ENCOUNTER — Other Ambulatory Visit: Payer: Self-pay

## 2013-12-28 ENCOUNTER — Encounter: Payer: Self-pay | Admitting: Cardiology

## 2013-12-28 MED ORDER — CALCITRIOL 0.25 MCG PO CAPS: 0.2500 ug | ORAL_CAPSULE | Freq: Every day | ORAL | Status: AC

## 2014-01-02 ENCOUNTER — Ambulatory Visit (INDEPENDENT_AMBULATORY_CARE_PROVIDER_SITE_OTHER): Payer: Medicare Other | Admitting: Pharmacist Clinician (PhC)/ Clinical Pharmacy Specialist

## 2014-01-02 ENCOUNTER — Telehealth: Payer: Self-pay

## 2014-01-02 DIAGNOSIS — I498 Other specified cardiac arrhythmias: Secondary | ICD-10-CM

## 2014-01-02 DIAGNOSIS — Z7901 Long term (current) use of anticoagulants: Secondary | ICD-10-CM

## 2014-01-02 LAB — POCT INR: INR: 1.8

## 2014-01-02 NOTE — Telephone Encounter (Signed)
called to give [facility]  pallative care Dr.Hochreins instructions.Bmet 1 xmonthly, ok for pt to have limited massage.lmom for her to call the office.

## 2014-01-02 NOTE — Telephone Encounter (Signed)
Maura given Dr.Hochrein instructions Bmet 1x monthly. Ok for pt to have limited massage. She verbalized understanding

## 2014-01-02 NOTE — Telephone Encounter (Signed)
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 They would l;ike to know if pt can have limited massage, avoiding certain areas.adv her  would discuss with Dr.Hochrein and call back with his recommendations.she verbalized understanding.

## 2014-01-09 LAB — PROTIME-INR: INR: 1.7 — AB (ref 0.9–1.1)

## 2014-01-10 ENCOUNTER — Ambulatory Visit (INDEPENDENT_AMBULATORY_CARE_PROVIDER_SITE_OTHER): Payer: Medicare Other | Admitting: Cardiovascular Disease

## 2014-01-10 DIAGNOSIS — Z7901 Long term (current) use of anticoagulants: Secondary | ICD-10-CM

## 2014-01-10 DIAGNOSIS — I498 Other specified cardiac arrhythmias: Secondary | ICD-10-CM

## 2014-01-12 ENCOUNTER — Telehealth: Payer: Self-pay | Admitting: Cardiology

## 2014-01-12 NOTE — Telephone Encounter (Signed)
New message    Blood work test results.

## 2014-01-12 NOTE — Telephone Encounter (Signed)
Unable to leave message memory full

## 2014-01-19 ENCOUNTER — Ambulatory Visit (INDEPENDENT_AMBULATORY_CARE_PROVIDER_SITE_OTHER): Payer: Medicare Other | Admitting: Pharmacist

## 2014-01-19 DIAGNOSIS — Z7901 Long term (current) use of anticoagulants: Secondary | ICD-10-CM

## 2014-01-19 DIAGNOSIS — I498 Other specified cardiac arrhythmias: Secondary | ICD-10-CM

## 2014-01-19 LAB — POCT INR: INR: 2.5

## 2014-01-22 ENCOUNTER — Telehealth: Payer: Self-pay | Admitting: Cardiology

## 2014-01-22 ENCOUNTER — Encounter: Payer: Self-pay | Admitting: Cardiology

## 2014-01-22 NOTE — Telephone Encounter (Signed)
OK to draw lab early.  Requested she call back with any other questions or concerns.

## 2014-01-22 NOTE — Telephone Encounter (Signed)
New problem       Maura RN called to report pt threw up for about 3 hr yesterday.  Pt is still nauseated today and is tolerating ice chips OK .Marland Kitchen Pt would like to know if she can have her BMET done today instead of 6/5.    Pt's heart rate is irregular.   Please give Rn a call #[phone]

## 2014-01-26 ENCOUNTER — Ambulatory Visit (INDEPENDENT_AMBULATORY_CARE_PROVIDER_SITE_OTHER): Payer: Medicare Other | Admitting: Cardiovascular Disease

## 2014-01-26 ENCOUNTER — Telehealth: Payer: Self-pay | Admitting: Cardiology

## 2014-01-26 DIAGNOSIS — I498 Other specified cardiac arrhythmias: Secondary | ICD-10-CM

## 2014-01-26 DIAGNOSIS — Z7901 Long term (current) use of anticoagulants: Secondary | ICD-10-CM

## 2014-01-26 LAB — POCT INR: INR: 3.8

## 2014-01-26 NOTE — Telephone Encounter (Signed)
Will forward to Dr Hochrein for recommendations. 

## 2014-01-26 NOTE — Telephone Encounter (Signed)
New message    Patient was visit by hospice admission today who implementing  the following changes to medication.   Attempt to better control her reflex.  Starting on Reglan  5 mg 3 time a day with meal .     Hold compazine during the trial of Reglan - trying  nexium   20 mg daily .  Please advise

## 2014-02-02 ENCOUNTER — Ambulatory Visit (INDEPENDENT_AMBULATORY_CARE_PROVIDER_SITE_OTHER): Payer: Medicare Other | Admitting: Cardiology

## 2014-02-02 DIAGNOSIS — I498 Other specified cardiac arrhythmias: Secondary | ICD-10-CM

## 2014-02-02 DIAGNOSIS — Z7901 Long term (current) use of anticoagulants: Secondary | ICD-10-CM

## 2014-02-02 LAB — POCT INR: INR: 3.5

## 2014-02-05 ENCOUNTER — Ambulatory Visit (INDEPENDENT_AMBULATORY_CARE_PROVIDER_SITE_OTHER): Payer: Medicare Other | Admitting: Podiatry

## 2014-02-05 ENCOUNTER — Encounter: Payer: Self-pay | Admitting: Cardiology

## 2014-02-05 VITALS — BP 109/68 | HR 82 | Resp 12

## 2014-02-05 DIAGNOSIS — B351 Tinea unguium: Secondary | ICD-10-CM

## 2014-02-05 DIAGNOSIS — M79609 Pain in unspecified limb: Secondary | ICD-10-CM

## 2014-02-05 NOTE — Progress Notes (Signed)
Patient ID: Miranda MarvelBarbara R Kelso, female   DOB: 03/05/1941, 73 y.o.   MRN: [id number]  Subjective: 73 year old white female seated in a wheelchair is unable to transfer to treatment table. Patient orientated x3 Caregiver present  Objective: Brittle, elongated, discolored toenails x10  Assessment: Symptomatic onychomycoses x10  Plan: Nails x10 are debrided without a bleeding  Reappoint x3 months

## 2014-02-06 ENCOUNTER — Telehealth: Payer: Self-pay | Admitting: Cardiology

## 2014-02-06 NOTE — Telephone Encounter (Signed)
New Message:  Calling to give abnormal UA results

## 2014-02-06 NOTE — Telephone Encounter (Signed)
Spoke with Sharman CrateMaura, Hospice Nurse, [phone].  Pt had urinalysis 02/05/14. Pt had complained of oliguria and that is the reason the UA was done. She states she has a standing order for a clean cath urine from Dr Antoine PocheHochrein. She reported neg nitrates but large amount of leukocytes, she did not have any other information to report. She called notify Dr Antoine PocheHochrein that she was faxing a copy of the report to him for review.

## 2014-02-07 NOTE — Telephone Encounter (Signed)
Follow up      Patient was started on bactrim by mouth 1 tab bid for 7da for a UTI.  Who would Dr Antoine PocheHochrein like hospice to call next time it is not a cardiology related problem?

## 2014-02-07 NOTE — Telephone Encounter (Signed)
Left message for Toniann FailWendy that we have not received the u/a results and Dr Antoine PocheHochrein is not in the office

## 2014-02-07 NOTE — Telephone Encounter (Signed)
Received u/a results this afternoon they were scanned into EPIC - documented on lab pt was started on Bactrim.

## 2014-02-08 ENCOUNTER — Ambulatory Visit (INDEPENDENT_AMBULATORY_CARE_PROVIDER_SITE_OTHER): Payer: Medicare Other | Admitting: Cardiovascular Disease

## 2014-02-08 ENCOUNTER — Telehealth: Payer: Self-pay | Admitting: Cardiology

## 2014-02-08 DIAGNOSIS — Z7901 Long term (current) use of anticoagulants: Secondary | ICD-10-CM

## 2014-02-08 DIAGNOSIS — I498 Other specified cardiac arrhythmias: Secondary | ICD-10-CM

## 2014-02-08 LAB — POCT INR: INR: 2.4

## 2014-02-08 NOTE — Telephone Encounter (Signed)
INR results called to coumadin clinic. Please see coumadin encounter for this date and her dosing instructions.

## 2014-02-08 NOTE — Telephone Encounter (Signed)
New message     Pt is not starting on bactrim because of coumadin---she started on amoxicillian.  Should hospice check her pt/inr because she had bleeding last night.  Could be from UTI--not sure but want to check her pt/inr.

## 2014-02-08 NOTE — Telephone Encounter (Signed)
Spoke with Toniann FailWendy at hospice, who informed me that patient will have a PT/INR drawn today given recent bleeding and amoxicillin use.  They will call INR result to our office.  Bactrim never used due to interaction with coumadin.

## 2014-02-08 NOTE — Telephone Encounter (Signed)
Will forward to anticoagulation clinic for review and to determine when pt needs next INR.

## 2014-02-15 ENCOUNTER — Telehealth: Payer: Self-pay | Admitting: Cardiology

## 2014-02-15 ENCOUNTER — Other Ambulatory Visit: Payer: Self-pay

## 2014-02-15 MED ORDER — FLUTICASONE PROPIONATE 50 MCG/ACT NA SUSP: 2.0000 | NASAL | Status: AC | PRN

## 2014-02-15 NOTE — Telephone Encounter (Signed)
New problem    She is in the home now and pt is having urinary symptoms and would like a repeat U/A and having trouble sleeping.

## 2014-02-15 NOTE — Telephone Encounter (Signed)
Spoke with Miranda [name] who states C and S was never completed on the U/A that was done last week.  Pt has been on Amoxicillin for it.  She is having trouble sleeping as well even though she is taking (2) ativan at night for sleep.  Advised Dr Antoine PocheHochrein generally doesn't RX sleep medications and is going to be on vacation.  Requested she contact Hospice MD for review and orders.  She states understanding.  She will let the CC know if antibiotic changes for the treatment of the UTI that would alter her INRs.

## 2014-02-15 NOTE — Telephone Encounter (Signed)
Follow up   Hospice  starting pt on:   MACROBID 100 mg 2 x for 7 days due to sensitivity results and is also starting her on 3 mg of Melatonin at that time.  No interaction with Coumadin. Sharman CrateMaura RN # [phone]

## 2014-02-16 NOTE — Telephone Encounter (Signed)
Noted! Thank you

## 2014-02-20 ENCOUNTER — Telehealth: Payer: Self-pay | Admitting: *Deleted

## 2014-02-20 NOTE — Telephone Encounter (Signed)
Follow up     Need comfort medication for patient.

## 2014-02-20 NOTE — Telephone Encounter (Signed)
Please see telephone note from 02/20/14

## 2014-02-20 NOTE — Telephone Encounter (Signed)
[facility] with [facility] - she is reporting Britta MccreedyBarbara is actively dying and needs a RX for Roxanol.  She is requesting Dr Antoine PocheHochrein write for it if possible.  He is not in the office this week however [name], NP stated she would write it.  Hospice nurse called back and stated one of the Hospice physicians is going to write for it.   She is also getting a RX for Atropine prn.

## 2014-02-26 ENCOUNTER — Telehealth: Payer: Self-pay | Admitting: Cardiology

## 2014-02-26 NOTE — Telephone Encounter (Signed)
D/C received Via Fax From [employer], Sent Interoffice To [address] [facility].  [phone]  [phone]   7.6.15/km

## 2014-02-26 NOTE — Telephone Encounter (Signed)
Spoke With [employer] D/C [facility].Hochrein Does not Need  To Sign. [facility] and she Is aware  7.6.15/km

## 2014-02-26 NOTE — Telephone Encounter (Signed)
Pt expired 03/21/2014

## 2014-03-02 ENCOUNTER — Ambulatory Visit: Payer: Self-pay | Admitting: Cardiology

## 2014-03-02 DIAGNOSIS — Z7901 Long term (current) use of anticoagulants: Secondary | ICD-10-CM

## 2014-03-02 DIAGNOSIS — I498 Other specified cardiac arrhythmias: Secondary | ICD-10-CM

## 2014-03-05 ENCOUNTER — Telehealth: Payer: Self-pay

## 2014-03-05 NOTE — Telephone Encounter (Signed)
Patient died @ Home per Obituary °

## 2014-03-21 ENCOUNTER — Encounter: Payer: Self-pay | Admitting: Cardiology

## 2014-03-24 DEATH — deceased

## 2014-05-16 ENCOUNTER — Ambulatory Visit: Payer: Medicare Other | Admitting: Podiatry

## 2014-08-08 IMAGING — CR DG ABDOMEN ACUTE W/ 1V CHEST
1 series · 1 of 1 positions shown · non-contrast
Comparison: Chest radiograph dated 06/04/2013. Abdominal radiograph
dated 10/19/2011.

CLINICAL DATA: Left side/abdominal pain, nausea/vomiting

EXAM:
ACUTE ABDOMEN SERIES (ABDOMEN 2 VIEW & CHEST 1 VIEW)

[view not recorded]
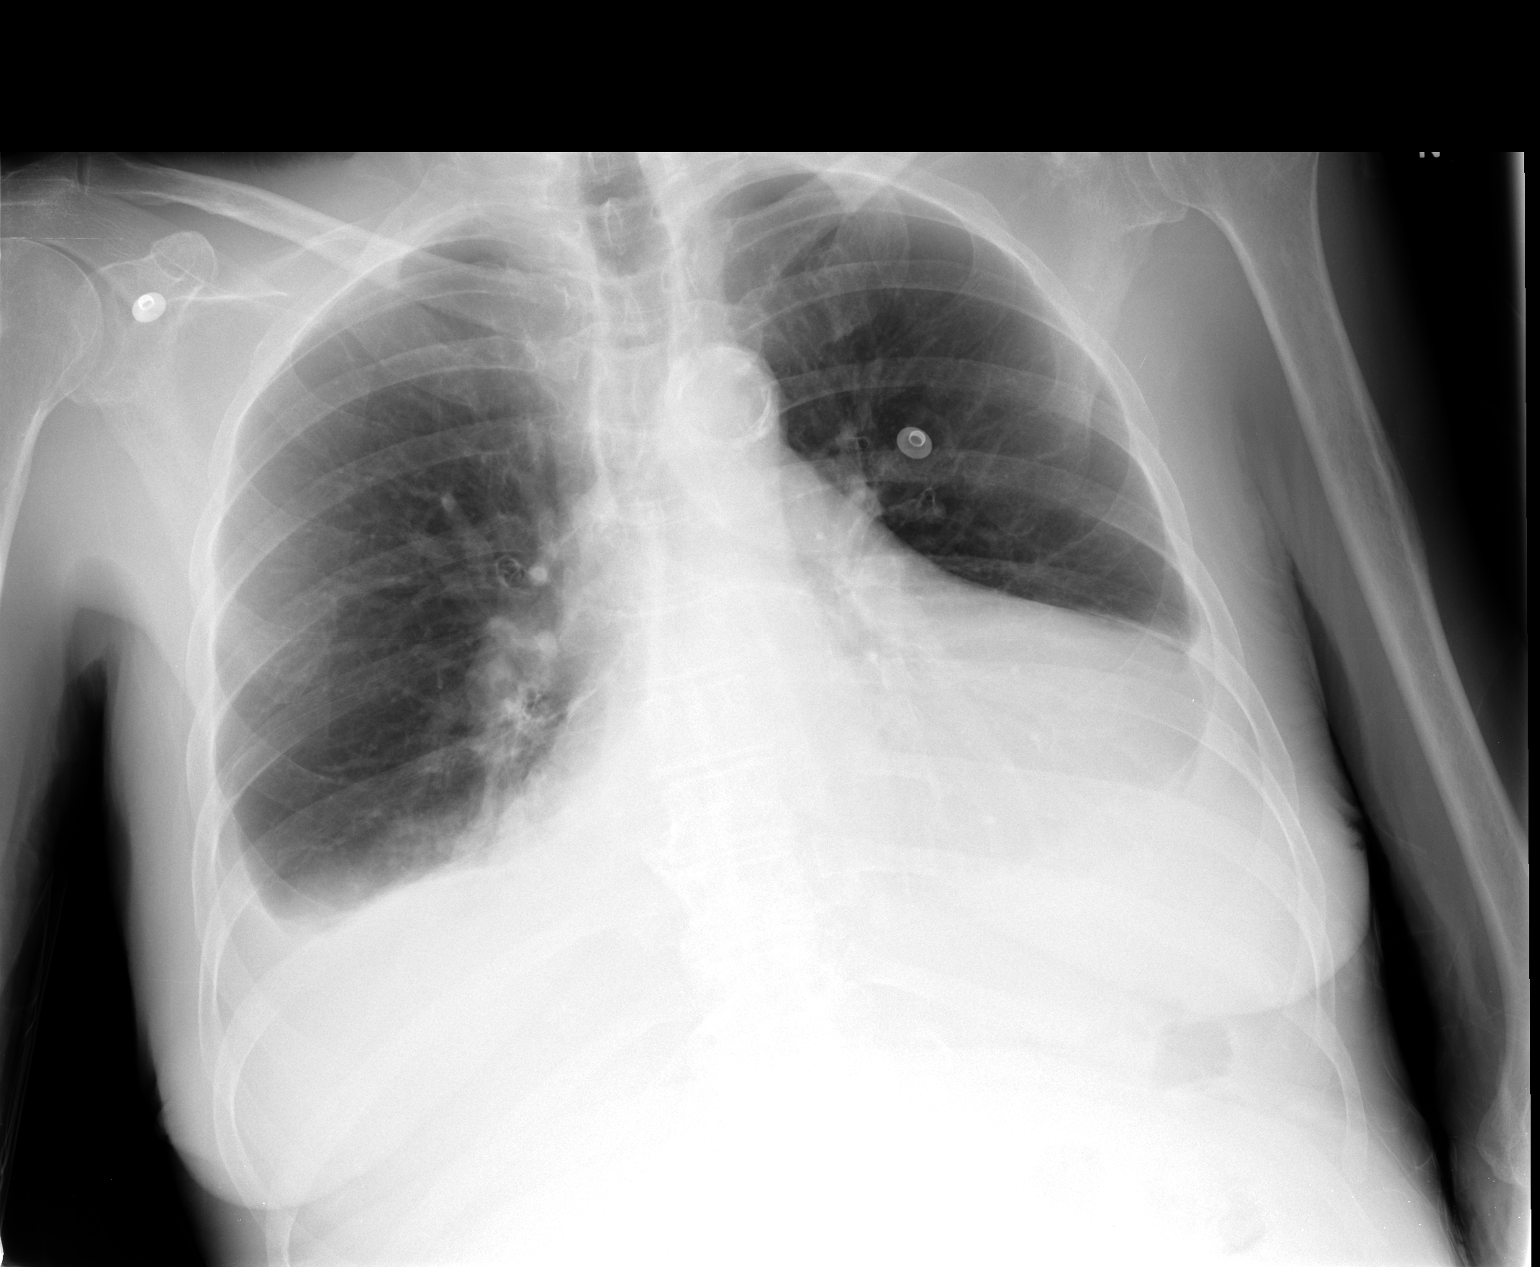

[1 of 1 positions shown; findings below may reference images not displayed]

FINDINGS: Small bilateral pleural effusions, unchanged. No frank interstitial
edema. No pneumothorax.

Cardiomegaly.

Nonspecific bowel gas pattern, without disproportionate small bowel
dilatation to suggest small bowel obstruction. A single mildly
prominent loop of small bowel in the left mid abdomen measures
cm, possibly reflecting adynamic ileus or small bowel enteritis.

No evidence of free air on the lateral decubitus view.

Degenerative changes of the visualized thoracolumbar spine.
IMPRESSION: Small bilateral pleural effusions, unchanged.

No findings to suggest small bowel obstruction or free air.

Mildly prominent loop of small bowel in the left mid abdomen,
possibly reflecting adynamic ileus or small bowel enteritis.
